# Patient Record
Sex: Female | Born: 1945
Health system: Southern US, Community
[De-identification: ages and names within clinical notes are randomized; demographics above are authoritative.]

## PROBLEM LIST (undated history)

## (undated) DIAGNOSIS — E039 Hypothyroidism, unspecified: Secondary | ICD-10-CM

## (undated) DIAGNOSIS — K439 Ventral hernia without obstruction or gangrene: Secondary | ICD-10-CM

## (undated) DIAGNOSIS — R0600 Dyspnea, unspecified: Secondary | ICD-10-CM

## (undated) DIAGNOSIS — M1990 Unspecified osteoarthritis, unspecified site: Secondary | ICD-10-CM

## (undated) DIAGNOSIS — D649 Anemia, unspecified: Secondary | ICD-10-CM

## (undated) DIAGNOSIS — I2699 Other pulmonary embolism without acute cor pulmonale: Secondary | ICD-10-CM

## (undated) DIAGNOSIS — M549 Dorsalgia, unspecified: Secondary | ICD-10-CM

## (undated) DIAGNOSIS — G8929 Other chronic pain: Secondary | ICD-10-CM

## (undated) DIAGNOSIS — R7303 Prediabetes: Secondary | ICD-10-CM

## (undated) DIAGNOSIS — J189 Pneumonia, unspecified organism: Secondary | ICD-10-CM

## (undated) DIAGNOSIS — I10 Essential (primary) hypertension: Secondary | ICD-10-CM

## (undated) HISTORY — PX: DILATION AND CURETTAGE OF UTERUS: SHX78

## (undated) HISTORY — DX: Essential (primary) hypertension: I10

## (undated) HISTORY — PX: OTHER SURGICAL HISTORY: SHX169

## (undated) HISTORY — PX: GASTRIC BYPASS: SHX52

---

## 1986-11-29 HISTORY — PX: SPLENECTOMY, TOTAL: SHX788

## 1986-11-29 HISTORY — PX: CHOLECYSTECTOMY: SHX55

## 2011-11-30 HISTORY — PX: ABDOMINAL HYSTERECTOMY: SHX81

## 2013-01-29 ENCOUNTER — Other Ambulatory Visit: Payer: Self-pay | Admitting: Physical Medicine and Rehabilitation

## 2013-01-29 ENCOUNTER — Ambulatory Visit
Admission: RE | Admit: 2013-01-29 | Discharge: 2013-01-29 | Disposition: A | Discharge status: Home / Self Care | Payer: Worker's Compensation | Source: Ambulatory Visit | Attending: Physical Medicine and Rehabilitation | Admitting: Physical Medicine and Rehabilitation

## 2013-01-29 DIAGNOSIS — IMO0002 Reserved for concepts with insufficient information to code with codable children: Secondary | ICD-10-CM

## 2013-01-29 DIAGNOSIS — R269 Unspecified abnormalities of gait and mobility: Secondary | ICD-10-CM

## 2013-01-29 DIAGNOSIS — G894 Chronic pain syndrome: Secondary | ICD-10-CM

## 2013-08-01 ENCOUNTER — Observation Stay (HOSPITAL_COMMUNITY)
Admission: EM | Admit: 2013-08-01 | Discharge: 2013-08-04 | Disposition: A | Discharge status: Home / Self Care | Payer: Medicare Other | Attending: Internal Medicine | Admitting: Internal Medicine

## 2013-08-01 ENCOUNTER — Encounter (HOSPITAL_COMMUNITY): Payer: Self-pay | Admitting: Emergency Medicine

## 2013-08-01 ENCOUNTER — Emergency Department (HOSPITAL_COMMUNITY): Payer: Medicare Other

## 2013-08-01 DIAGNOSIS — I5041 Acute combined systolic (congestive) and diastolic (congestive) heart failure: Secondary | ICD-10-CM | POA: Diagnosis present

## 2013-08-01 DIAGNOSIS — I509 Heart failure, unspecified: Secondary | ICD-10-CM | POA: Insufficient documentation

## 2013-08-01 DIAGNOSIS — R079 Chest pain, unspecified: Secondary | ICD-10-CM

## 2013-08-01 DIAGNOSIS — IMO0001 Reserved for inherently not codable concepts without codable children: Secondary | ICD-10-CM | POA: Diagnosis present

## 2013-08-01 DIAGNOSIS — R609 Edema, unspecified: Secondary | ICD-10-CM | POA: Insufficient documentation

## 2013-08-01 DIAGNOSIS — M549 Dorsalgia, unspecified: Secondary | ICD-10-CM | POA: Insufficient documentation

## 2013-08-01 DIAGNOSIS — R0789 Other chest pain: Principal | ICD-10-CM | POA: Insufficient documentation

## 2013-08-01 DIAGNOSIS — R9431 Abnormal electrocardiogram [ECG] [EKG]: Secondary | ICD-10-CM

## 2013-08-01 DIAGNOSIS — G8929 Other chronic pain: Secondary | ICD-10-CM

## 2013-08-01 DIAGNOSIS — Z79899 Other long term (current) drug therapy: Secondary | ICD-10-CM | POA: Insufficient documentation

## 2013-08-01 DIAGNOSIS — R12 Heartburn: Secondary | ICD-10-CM | POA: Insufficient documentation

## 2013-08-01 HISTORY — DX: Dorsalgia, unspecified: M54.9

## 2013-08-01 HISTORY — DX: Other chronic pain: G89.29

## 2013-08-01 LAB — CBC WITH DIFFERENTIAL/PLATELET
Eosinophils Relative: 5 % (ref 0–5)
HCT: 37.2 % (ref 36.0–46.0)
Hemoglobin: 12.1 g/dL (ref 12.0–15.0)
Lymphocytes Relative: 18 % (ref 12–46)
Lymphs Abs: 1.9 10*3/uL (ref 0.7–4.0)
MCV: 82.9 fL (ref 78.0–100.0)
Monocytes Absolute: 1.2 10*3/uL — ABNORMAL HIGH (ref 0.1–1.0)
Platelets: 461 10*3/uL — ABNORMAL HIGH (ref 150–400)
RBC: 4.49 MIL/uL (ref 3.87–5.11)
WBC: 10.6 10*3/uL — ABNORMAL HIGH (ref 4.0–10.5)

## 2013-08-01 LAB — TROPONIN I: Troponin I: <0.3 ng/mL (ref <0.30)

## 2013-08-01 LAB — COMPREHENSIVE METABOLIC PANEL
ALT: 20 U/L (ref 0–35)
CO2: 25 mEq/L (ref 19–32)
Calcium: 9.1 mg/dL (ref 8.4–10.5)
GFR calc Af Amer: >90 mL/min (ref >90)
GFR calc non Af Amer: >90 mL/min (ref >90)
Glucose, Bld: 138 mg/dL — ABNORMAL HIGH (ref 70–99)
Sodium: 136 mEq/L (ref 135–145)

## 2013-08-01 LAB — D-DIMER, QUANTITATIVE: D-Dimer, Quant: 1.34 ug/mL-FEU — ABNORMAL HIGH (ref 0.00–0.48)

## 2013-08-01 LAB — HEMOGLOBIN A1C: Hgb A1c MFr Bld: 6.5 % — ABNORMAL HIGH (ref <5.7)

## 2013-08-01 LAB — POCT I-STAT TROPONIN I: Troponin i, poc: 0 ng/mL (ref 0.00–0.08)

## 2013-08-01 MED ORDER — ONDANSETRON HCL 4 MG PO TABS: 4.0000 mg | ORAL_TABLET | Freq: Four times a day (QID) | ORAL | Status: DC | PRN

## 2013-08-01 MED ORDER — PANTOPRAZOLE SODIUM 40 MG PO TBEC
40.0000 mg | DELAYED_RELEASE_TABLET | Freq: Every day | ORAL | Status: DC
  Administered 2013-08-01 – 2013-08-04 (×4): 40 mg via ORAL
  Filled 2013-08-01 (×4): qty 1

## 2013-08-01 MED ORDER — CYCLOBENZAPRINE HCL 10 MG PO TABS
10.0000 mg | ORAL_TABLET | Freq: Three times a day (TID) | ORAL | Status: DC | PRN
  Administered 2013-08-02 (×2): 10 mg via ORAL
  Filled 2013-08-01 (×2): qty 1

## 2013-08-01 MED ORDER — FUROSEMIDE 10 MG/ML IJ SOLN
20.0000 mg | Freq: Every day | INTRAMUSCULAR | Status: DC
  Administered 2013-08-01: 20 mg via INTRAVENOUS
  Filled 2013-08-01 (×4): qty 2

## 2013-08-01 MED ORDER — NORTRIPTYLINE HCL 25 MG PO CAPS
25.0000 mg | ORAL_CAPSULE | Freq: Every day | ORAL | Status: DC
  Filled 2013-08-01 (×4): qty 3

## 2013-08-01 MED ORDER — HYDROMORPHONE HCL PF 1 MG/ML IJ SOLN
1.0000 mg | Freq: Once | INTRAMUSCULAR | Status: AC
  Administered 2013-08-01: 1 mg via INTRAVENOUS
  Filled 2013-08-01: qty 1

## 2013-08-01 MED ORDER — GI COCKTAIL ~~LOC~~
30.0000 mL | Freq: Once | ORAL | Status: AC
  Administered 2013-08-01: 30 mL via ORAL
  Filled 2013-08-01: qty 30

## 2013-08-01 MED ORDER — SODIUM CHLORIDE 0.9 % IJ SOLN
3.0000 mL | Freq: Two times a day (BID) | INTRAMUSCULAR | Status: DC
  Administered 2013-08-01 – 2013-08-03 (×4): 3 mL via INTRAVENOUS
  Administered 2013-08-04: 09:00:00 via INTRAVENOUS

## 2013-08-01 MED ORDER — ONDANSETRON HCL 4 MG/2ML IJ SOLN
4.0000 mg | Freq: Four times a day (QID) | INTRAMUSCULAR | Status: DC | PRN
  Administered 2013-08-01: 4 mg via INTRAVENOUS
  Filled 2013-08-01 (×2): qty 2

## 2013-08-01 MED ORDER — IOHEXOL 350 MG/ML SOLN
100.0000 mL | Freq: Once | INTRAVENOUS | Status: AC | PRN
  Administered 2013-08-01: 100 mL via INTRAVENOUS

## 2013-08-01 MED ORDER — ENOXAPARIN SODIUM 60 MG/0.6ML ~~LOC~~ SOLN
60.0000 mg | SUBCUTANEOUS | Status: DC
  Administered 2013-08-01 – 2013-08-03 (×3): 60 mg via SUBCUTANEOUS
  Filled 2013-08-01 (×4): qty 0.6

## 2013-08-01 MED ORDER — DIPHENHYDRAMINE HCL 50 MG/ML IJ SOLN
25.0000 mg | Freq: Once | INTRAMUSCULAR | Status: AC
  Administered 2013-08-01: 25 mg via INTRAVENOUS
  Filled 2013-08-01: qty 1

## 2013-08-01 MED ORDER — SODIUM CHLORIDE 0.9 % IV BOLUS (SEPSIS): 500.0000 mL | Freq: Once | INTRAVENOUS | Status: DC

## 2013-08-01 MED ORDER — ASPIRIN 81 MG PO CHEW
324.0000 mg | CHEWABLE_TABLET | Freq: Once | ORAL | Status: AC
  Administered 2013-08-01: 324 mg via ORAL
  Filled 2013-08-01: qty 4

## 2013-08-01 MED ORDER — ACETAMINOPHEN 325 MG PO TABS
650.0000 mg | ORAL_TABLET | Freq: Once | ORAL | Status: AC
  Administered 2013-08-01: 650 mg via ORAL
  Filled 2013-08-01: qty 2

## 2013-08-01 MED ORDER — HYDROMORPHONE HCL PF 1 MG/ML IJ SOLN: 0.5000 mg | INTRAMUSCULAR | Status: AC | PRN

## 2013-08-01 MED ORDER — DIPHENHYDRAMINE HCL 25 MG PO CAPS
25.0000 mg | ORAL_CAPSULE | ORAL | Status: DC | PRN
Start: 2013-08-01 — End: 2013-08-04
  Administered 2013-08-02: 25 mg via ORAL
  Filled 2013-08-01: qty 1

## 2013-08-01 MED ORDER — PNEUMOCOCCAL VAC POLYVALENT 25 MCG/0.5ML IJ INJ
0.5000 mL | INJECTION | INTRAMUSCULAR | Status: AC
  Administered 2013-08-02: 0.5 mL via INTRAMUSCULAR
  Filled 2013-08-01 (×2): qty 0.5

## 2013-08-01 NOTE — ED Notes (Addendum)
Pt c/o central chest pain x 2 weeks but got worse since Sun. Pt c/o SOB, lightheadedness.

## 2013-08-01 NOTE — ED Notes (Signed)
Admissions RN at bedside speaking with pt.

## 2013-08-01 NOTE — H&P (Signed)
[facility] History and Physical  Kathleen Miller ZOX:[id number] DOB: 06/15/1946 DOA: 08/01/2013  Referring physician: Dr Jodi Mourning PCP:   Chief Complaint: chest pressures since Monday.   HPI: Kathleen Miller is a 67 y.o. female with prior h/o of chornic back pain, came in for chest pressure started on Monday , non radiating , not associated with activity, no syncope, no pnd or orthopnea. Pedal edema since 4 weeks. No cough or fever. She was recently startedon indomethacin for back pain. Has heartburn. On arrival to ED today, she was chest pain free,. Her d dimer was elevated and she underwent ct angio, was neg for PE. SHE is referred to medical service for evaluation of atypical chest pain.    Review of Systems: The patient denies anorexia, fever, weight loss,, vision loss, decreased hearing, hoarseness, syncope, dyspnea on exertion, peripheral edema, , hemoptysis, abdominal pain, melena, hematochezia,  hematuria, incontinence, genital sores, muscle weakness, suspicious skin lesions, transient blindness,  depression, unusual weight change, abnormal bleeding, enlarged lymph nodes, angioedema, and breast masses.    Past Medical History  Diagnosis Date  . Chronic back pain   . Depression    Past Surgical History  Procedure Laterality Date  . Abdominal hysterectomy  11/2011  . Cholecystectomy  1988  . Splenectomy, total  1988  . Gastric bypass     Social History:  reports that she has never smoked. She has never used smokeless tobacco. She reports that she does not drink alcohol or use illicit drugs. where does patient live--home, No Known Allergies  History reviewed. No pertinent family history.  None known as per the patient.   Prior to Admission medications   Medication Sig Start Date End Date Taking? Authorizing Provider  aspirin EC 81 MG tablet Take 81 mg by mouth daily.   Yes Historical Provider, MD  cyclobenzaprine (FLEXERIL) 10 MG tablet Take 10 mg by mouth 3 (three) times  daily as needed for muscle spasms.   Yes Historical Provider, MD  indomethacin (INDOCIN) 50 MG capsule Take 50 mg by mouth 3 (three) times daily with meals.   Yes Historical Provider, MD  nortriptyline (PAMELOR) 25 MG capsule Take 25-75 mg by mouth at bedtime.   Yes Historical Provider, MD   Physical Exam: Filed Vitals:   08/01/13 1524  BP: 135/63  Pulse: 75  Temp:   Resp: 18    Constitutional: Vital signs reviewed.  Patient is a well-developed and well-nourished  in no acute distress and cooperative with exam. Alert and oriented x3.  Head: Normocephalic and atraumatic Mouth: no erythema or exudates, MMM Eyes: PERRL, EOMI, conjunctivae normal, No scleral icterus.  Neck: Supple, Trachea midline normal ROM, No JVD, mass, thyromegaly, or carotid bruit present.  Cardiovascular: RRR, S1 normal, S2 normal, no MRG, pulses symmetric and intact bilaterally Pulmonary/Chest: normal respiratory effort, CTAB, no wheezes, rales, or rhonchi Abdominal: Soft. Non-tender, non-distended, bowel sounds are normal, no masses, organomegaly, or guarding present.  Musculoskeletal: leg edema with mild erythema and small blisters. No tenderness  Hematology: no cervical, inginal, or axillary adenopathy.  Neurological: A&O x3, Strength is normal and symmetric bilaterally, cranial nerve II-XII are grossly intact, no focal motor deficit, sensory intact to light touch bilaterally.  Psychiatric: Normal mood and affect. speech and behavior is normal. Judgment and thought content normal.    Labs on Admission:  Basic Metabolic Panel:  Recent Labs Lab 08/01/13 1106  NA 136  K 3.7  CL 100  CO2 25  GLUCOSE 138*  BUN 17  CREATININE 0.66  CALCIUM 9.1   Liver Function Tests:  Recent Labs Lab 08/01/13 1106  AST 19  ALT 20  ALKPHOS 115  BILITOT 0.3  PROT 7.5  ALBUMIN 3.5   No results found for this basename: LIPASE, AMYLASE,  in the last 168 hours No results found for this basename: AMMONIA,  in the last  168 hours CBC:  Recent Labs Lab 08/01/13 1106  WBC 10.6*  NEUTROABS 6.7  HGB 12.1  HCT 37.2  MCV 82.9  PLT 461*   Cardiac Enzymes:  Recent Labs Lab 08/01/13 1106  TROPONINI <0.30    BNP (last 3 results)  Recent Labs  08/01/13 1129  PROBNP 202.3*   CBG: No results found for this basename: GLUCAP,  in the last 168 hours  Radiological Exams on Admission: Dg Chest 2 View  08/01/2013   CLINICAL DATA:  67 year old female with chest pain and pressure.  EXAM: CHEST  2 VIEW  COMPARISON:  None.  FINDINGS: Low lung volumes. Normal cardiac size and mediastinal contours. Visualized tracheal air column is within normal limits. No pneumothorax. No pleural effusion or consolidation. Mild increased interstitial markings, favor pulmonary vascular congestion. No overt pulmonary edema. No acute osseous abnormality identified. Staple lines in the ventral upper abdomen.  IMPRESSION: Low lung volumes with pulmonary vascular congestion.   Electronically Signed   By: Augusto Gamble   On: 08/01/2013 11:55   Ct Angio Chest Pe W/cm &/or Wo Cm  08/01/2013   CLINICAL DATA:  67 year old female with central chest pain, shortness of breath, lightheadedness.  EXAM: CT ANGIOGRAPHY CHEST WITH CONTRAST  TECHNIQUE: Multidetector CT imaging of the chest was performed using the standard protocol during bolus administration of intravenous contrast. Multiplanar CT image reconstructions including MIPs were obtained to evaluate the vascular anatomy.  CONTRAST:  OMNIPAQUE IOHEXOL 350 MG/ML SOLN  COMPARISON:  Chest radiographs from earlier the same day.  FINDINGS: Adequate contrast bolus timing in the pulmonary arterial tree.  No focal filling defect identified in the pulmonary arterial tree to suggest the presence of acute pulmonary embolism.  Atelectatic changes to the trachea. Otherwise Major airways are patent. Dependent mild ground-glass opacity most compatible with atelectasis. Small 4 mm nodule along the confluence of  the right major and minor fissures. No pulmonary consolidation.  No acute osseous abnormality identified.  Negative thoracic inlet. No pericardial effusion. Small bilateral hilar lymph nodes right greater than left. No mediastinal lymphadenopathy.  Postoperative changes to the left upper quadrant partially visible. Evidence of splenectomy and subsequent splenosis. Postoperative changes to the stomach partially visible. Suggestion of diffuse decreased density in the liver.  Visualized aorta is patent and within normal limits.  Review of the MIP images confirms the above findings.  IMPRESSION: 1. No evidence of acute pulmonary embolus.  2. Pulmonary atelectasis. Mild reactive appearing hilar and mediastinal lymphadenopathy.  3. Postoperative changes in the left upper quadrant.   Electronically Signed   By: Augusto Gamble   On: 08/01/2013 13:21    EKG: sinus rhythm with t wave changes.   Assessment/Plan 1. Atypical chest pain: - probably from the heartburn from using indomethacin.  - admit to telemetry.  - will rule out ACS with cardiac enzymes , EKG, and echo.  - continue with aspirin 325mg  daily . And repeat EKG in am.  - gi cocktail and protonix added - stopped indomethacin.   2. Bilateral pedal edema: - slightly elevated pro bnp - IV lasix ordered.  - venous duplex to rule  out DVT.   3. Chronic back pain :  - resume home medications.   4. DVT prophylaxis.    Code Status: full code Family Communication:  Disposition Plan: pending  Time spent: 75 min  Theresa Dohrman [facility] Pager [phone]  If 7PM-7AM, please contact night-coverage [email or web address] Password [email or web address] 08/01/2013, 5:06 PM

## 2013-08-01 NOTE — ED Provider Notes (Signed)
CSN: [id number]     Arrival date & time 08/01/13  1051 History   First MD Initiated Contact with Patient 08/01/13 1103     Chief Complaint  Patient presents with  . Chest Pain   (Consider location/radiation/quality/duration/timing/severity/associated sxs/prior Treatment) HPI Comments: 67 yo female with obesity, chronic back pain, on pain meds presents with central chest pain and pressure for the past week with mild dyspnea.  Pt has had left flank pain for about a month since a fall but this pressure is new since the weekend, intermittent, at times worse with deep breath, exertion and movement.  No diaphoresis or nausea.  No known heart issues.  Lasts usually an hour.  Improves on its own.  No recent surgery, travel, dvt/ pe hx, estrogens or hemoptysis.    Patient is a 67 y.o. female presenting with chest pain. The history is provided by the patient.  Chest Pain Associated symptoms: back pain, headache (intermittent) and shortness of breath   Associated symptoms: no abdominal pain, no cough, no fever, no numbness and not vomiting     History reviewed. No pertinent past medical history. History reviewed. No pertinent past surgical history. No family history on file. History  Substance Use Topics  . Smoking status: Never Smoker   . Smokeless tobacco: Not on file  . Alcohol Use: No   OB History   Grav Para Term Preterm Abortions TAB SAB Ect Mult Living                 Review of Systems  Constitutional: Negative for fever and chills.  HENT: Negative for neck pain and neck stiffness.   Eyes: Negative for visual disturbance.  Respiratory: Positive for shortness of breath. Negative for cough.   Cardiovascular: Positive for chest pain.  Gastrointestinal: Negative for vomiting and abdominal pain.  Genitourinary: Positive for flank pain. Negative for dysuria.  Musculoskeletal: Positive for back pain.  Skin: Negative for rash.  Neurological: Positive for headaches (intermittent). Negative  for light-headedness and numbness.    Allergies  Review of patient's allergies indicates no known allergies.  Home Medications   Current Outpatient Rx  Name  Route  Sig  Dispense  Refill  . aspirin EC 81 MG tablet   Oral   Take 81 mg by mouth daily.         . cyclobenzaprine (FLEXERIL) 10 MG tablet   Oral   Take 10 mg by mouth 3 (three) times daily as needed for muscle spasms.         . indomethacin (INDOCIN) 50 MG capsule   Oral   Take 50 mg by mouth 3 (three) times daily with meals.         . nortriptyline (PAMELOR) 25 MG capsule   Oral   Take 25-75 mg by mouth at bedtime.          BP 140/71  Temp(Src) 98.2 F (36.8 C) (Oral)  Ht 5\' 2"  (1.575 m)  Wt 290 lb (131.543 kg)  BMI 53.03 kg/m2  SpO2 97% Physical Exam  Nursing note and vitals reviewed. Constitutional: She is oriented to person, place, and time. She appears well-developed and well-nourished. No distress.  HENT:  Head: Normocephalic and atraumatic.  Eyes: Conjunctivae are normal. Right eye exhibits no discharge. Left eye exhibits no discharge.  Neck: Normal range of motion. Neck supple. No tracheal deviation present.  Cardiovascular: Normal rate and regular rhythm.   Pulmonary/Chest: Effort normal. No respiratory distress.  Decreased air movement bilat combination of  obesity/ pain in back with breathing (chronic per pt)  Abdominal: Soft. She exhibits no distension. There is no tenderness. There is no guarding.  obeses  Musculoskeletal: She exhibits edema (bilateral 2+ with superficail blisters anterior, no bullae or induration) and tenderness (lower back and left lower ribs/ flank).  Neurological: She is alert and oriented to person, place, and time. No cranial nerve deficit.  Skin: Skin is warm. No rash noted.  Psychiatric: She has a normal mood and affect.    ED Course  Procedures (including critical care time) Labs Review Labs Reviewed  CBC WITH DIFFERENTIAL - Abnormal; Notable for the  following:    WBC 10.6 (*)    Platelets 461 (*)    Monocytes Absolute 1.2 (*)    Basophils Relative 2 (*)    Basophils Absolute 0.2 (*)    All other components within normal limits  COMPREHENSIVE METABOLIC PANEL - Abnormal; Notable for the following:    Glucose, Bld 138 (*)    All other components within normal limits  PRO B NATRIURETIC PEPTIDE - Abnormal; Notable for the following:    Pro B Natriuretic peptide (BNP) 202.3 (*)    All other components within normal limits  D-DIMER, QUANTITATIVE - Abnormal; Notable for the following:    D-Dimer, Quant 1.34 (*)    All other components within normal limits  TROPONIN I   Imaging Review No results found.  MDM  No diagnosis found.  CP differential CAD with age/ obesity vs PE vs MSK. D dimer as pt low risk.  Cardiac eval.   Recommended observation since pressure intermittent. ASA given.  EKG possible old infarct, no old. Reviewed.  Date: 08/01/2013  Rate: 99  Rhythm: normal sinus rhythm  QRS Axis: normal  Intervals: QT prolonged  ST/T Wave abnormalities: nonspecific ST changes  Conduction Disutrbances:none  Narrative Interpretation: q waves v1/2  Old EKG Reviewed: none available EKG and cxr reviewed.  Ddimer pos.  CT to rule out PE.  CXR mild congestion, possible CHF.  Pt not requiring O2. Plan for tele/ observation. Pt and Dr Blake Divine agrees with plan.  Enid Skeens, MD 08/01/13 1524

## 2013-08-01 NOTE — ED Notes (Signed)
Patient transported to CT 

## 2013-08-01 NOTE — ED Notes (Signed)
Pt.states she feels much better now. Pt denies pain or pressure to her chest. Pt states she has pain to her mid back. Pt resting quietly, watching TV. VSS.

## 2013-08-02 ENCOUNTER — Other Ambulatory Visit: Payer: Self-pay

## 2013-08-02 DIAGNOSIS — IMO0001 Reserved for inherently not codable concepts without codable children: Secondary | ICD-10-CM | POA: Diagnosis present

## 2013-08-02 DIAGNOSIS — R079 Chest pain, unspecified: Secondary | ICD-10-CM

## 2013-08-02 DIAGNOSIS — I519 Heart disease, unspecified: Secondary | ICD-10-CM

## 2013-08-02 DIAGNOSIS — I5041 Acute combined systolic (congestive) and diastolic (congestive) heart failure: Secondary | ICD-10-CM | POA: Diagnosis present

## 2013-08-02 LAB — GLUCOSE, CAPILLARY
Glucose-Capillary: 115 mg/dL — ABNORMAL HIGH (ref 70–99)
Glucose-Capillary: 159 mg/dL — ABNORMAL HIGH (ref 70–99)
Glucose-Capillary: 130 mg/dL — ABNORMAL HIGH (ref 70–99)

## 2013-08-02 LAB — TROPONIN I: Troponin I: <0.3 ng/mL (ref <0.30)

## 2013-08-02 LAB — LIPID PANEL: LDL Cholesterol: 126 mg/dL — ABNORMAL HIGH (ref 0–99)

## 2013-08-02 MED ORDER — LIVING WELL WITH DIABETES BOOK
Freq: Once | Status: AC
  Administered 2013-08-02: 13:00:00
  Filled 2013-08-02: qty 1

## 2013-08-02 MED ORDER — SIMVASTATIN 10 MG PO TABS
10.0000 mg | ORAL_TABLET | Freq: Every day | ORAL | Status: DC
  Administered 2013-08-02 – 2013-08-03 (×2): 10 mg via ORAL
  Filled 2013-08-02 (×3): qty 1

## 2013-08-02 MED ORDER — FUROSEMIDE 10 MG/ML IJ SOLN
40.0000 mg | Freq: Every day | INTRAMUSCULAR | Status: DC
  Filled 2013-08-02: qty 4

## 2013-08-02 MED ORDER — FUROSEMIDE 10 MG/ML IJ SOLN
40.0000 mg | Freq: Every day | INTRAMUSCULAR | Status: DC
  Administered 2013-08-02 – 2013-08-03 (×2): 40 mg via INTRAVENOUS
  Filled 2013-08-02 (×2): qty 4

## 2013-08-02 MED ORDER — ASPIRIN EC 81 MG PO TBEC
81.0000 mg | DELAYED_RELEASE_TABLET | Freq: Every day | ORAL | Status: DC
  Administered 2013-08-02 – 2013-08-04 (×3): 81 mg via ORAL
  Filled 2013-08-02 (×3): qty 1

## 2013-08-02 MED ORDER — TRAMADOL HCL 50 MG PO TABS
50.0000 mg | ORAL_TABLET | Freq: Four times a day (QID) | ORAL | Status: DC | PRN
  Administered 2013-08-02: 50 mg via ORAL
  Filled 2013-08-02: qty 1

## 2013-08-02 MED ORDER — METFORMIN HCL 500 MG PO TABS
500.0000 mg | ORAL_TABLET | Freq: Every day | ORAL | Status: DC
  Filled 2013-08-02 (×2): qty 1

## 2013-08-02 MED ORDER — CARVEDILOL 3.125 MG PO TABS
3.1250 mg | ORAL_TABLET | Freq: Two times a day (BID) | ORAL | Status: DC
  Administered 2013-08-02 – 2013-08-04 (×4): 3.125 mg via ORAL
  Filled 2013-08-02 (×6): qty 1

## 2013-08-02 MED ORDER — ACETAMINOPHEN 325 MG PO TABS
650.0000 mg | ORAL_TABLET | ORAL | Status: DC | PRN
  Administered 2013-08-02 – 2013-08-04 (×3): 650 mg via ORAL
  Filled 2013-08-02 (×3): qty 2

## 2013-08-02 MED ORDER — DOCUSATE SODIUM 100 MG PO CAPS
100.0000 mg | ORAL_CAPSULE | Freq: Two times a day (BID) | ORAL | Status: DC
  Administered 2013-08-02 – 2013-08-04 (×4): 100 mg via ORAL
  Filled 2013-08-02 (×5): qty 1

## 2013-08-02 MED ORDER — LISINOPRIL 2.5 MG PO TABS
2.5000 mg | ORAL_TABLET | Freq: Every day | ORAL | Status: DC
  Administered 2013-08-02 – 2013-08-04 (×3): 2.5 mg via ORAL
  Filled 2013-08-02 (×3): qty 1

## 2013-08-02 MED ORDER — INSULIN ASPART 100 UNIT/ML ~~LOC~~ SOLN
0.0000 [IU] | Freq: Three times a day (TID) | SUBCUTANEOUS | Status: DC
  Administered 2013-08-02: 2 [IU] via SUBCUTANEOUS
  Administered 2013-08-03: 1 [IU] via SUBCUTANEOUS

## 2013-08-02 NOTE — Progress Notes (Signed)
  RD consulted for nutrition education regarding diabetes.   Lab Results  Component Value Date   HGBA1C 6.5* 08/01/2013   RD provided "Carbohydrate Counting for People with Diabetes" handout from the Academy of [employer]etics. Discussed different food groups and their effects on blood sugar, emphasizing carbohydrate-containing foods. Provided list of carbohydrates and recommended serving sizes of common foods.  Discussed importance of controlled and consistent carbohydrate intake throughout the day. Provided examples of ways to balance meals/snacks and encouraged intake of high-fiber, whole grain complex carbohydrates. Pt also reports struggling with weight gain and fluid retention. Recommended pt decreased her intake of sodium and fat in her diet. Reviewed high fat and high sodium foods for pt to avoid. Advised pt to discuss physical activity with her PCP. Teach back method used.  Expect good compliance.  Body mass index is 55.43 kg/(m^2). Pt meets criteria for Morbid Obesity based on current BMI. Pt reports weighing 208 lbs a year ago and gaining weight after being injured. She reports her usual body weight now is 288 lbs.   Current diet order is Carb Modified, patient is consuming approximately 50% of meals at this time. Pt states her appetite is good she just didn't like the food at breakfast. Labs and medications reviewed. No further nutrition interventions warranted at this time. RD contact information provided. Encouraged pt to call with any questions. If additional nutrition issues arise, please re-consult RD.  Ian Malkin RD, LDN Inpatient Clinical Dietitian Pager: [phone] After Hours Pager: [phone]

## 2013-08-02 NOTE — Consult Note (Signed)
Reason for Consult: Atypical chest pain Referring Physician: [facility] hospitalist PCP: None Primary cardiologist none Kathleen Miller is an 67 y.o. female.  HPI: This 67 year old woman was admitted on 08/01/13 with atypical chest pain.  The pain was atypical and began in the left lateral chest and radiated under the left breast.She also felt that that area was more swollen.she does not have any prior history of known heart problems.She does have a past history of labile hypertension previously untreated.  Recently she has been taking indomethacin for back pain.  She has been experiencing increasing peripheral edema and weight gain and exertional dyspnea.  She has been experiencing heartburn and dyspepsia.  She had an elevated d-dimer and underwent CT angiogram which was negative for pulmonary embolus.  Her electrocardiograms have been nonacute.  Her chest x-ray shows cardiomegaly with a left ventricular configuration and mild to moderate pulmonary vascular congestion.  Troponins have been negative x3.  Her pro BNP is mildly elevated at 202.  Past Medical History  Diagnosis Date  . Chronic back pain   . Depression     Past Surgical History  Procedure Laterality Date  . Abdominal hysterectomy  11/2011  . Cholecystectomy  1988  . Splenectomy, total  1988  . Gastric bypass      History reviewed. No pertinent family history.  Social History:  reports that she has never smoked. She has never used smokeless tobacco. She reports that she does not drink alcohol or use illicit drugs.  Allergies: No Known Allergies  Medications:  Scheduled: . enoxaparin (LOVENOX) injection  60 mg Subcutaneous Q24H  . furosemide  40 mg Intravenous Daily  . insulin aspart  0-9 Units Subcutaneous TID WC  . lisinopril  2.5 mg Oral Daily  . living well with diabetes book   Does not apply Once  . nortriptyline  25-75 mg Oral QHS  . pantoprazole  40 mg Oral Daily  . simvastatin  10 mg Oral q1800  . sodium chloride  3  mL Intravenous Q12H    Results for orders placed during the hospital encounter of 08/01/13 (from the past 48 hour(s))  CBC WITH DIFFERENTIAL     Status: Abnormal   Collection Time    08/01/13 11:06 AM      Result Value Range   WBC 10.6 (*) 4.0 - 10.5 K/uL   RBC 4.49  3.87 - 5.11 MIL/uL   Hemoglobin 12.1  12.0 - 15.0 g/dL   HCT 96.0  45.4 - 09.8 %   MCV 82.9  78.0 - 100.0 fL   MCH 26.9  26.0 - 34.0 pg   MCHC 32.5  30.0 - 36.0 g/dL   RDW 11.9  14.7 - 82.9 %   Platelets 461 (*) 150 - 400 K/uL   Neutrophils Relative % 63  43 - 77 %   Neutro Abs 6.7  1.7 - 7.7 K/uL   Lymphocytes Relative 18  12 - 46 %   Lymphs Abs 1.9  0.7 - 4.0 K/uL   Monocytes Relative 12  3 - 12 %   Monocytes Absolute 1.2 (*) 0.1 - 1.0 K/uL   Eosinophils Relative 5  0 - 5 %   Eosinophils Absolute 0.5  0.0 - 0.7 K/uL   Basophils Relative 2 (*) 0 - 1 %   Basophils Absolute 0.2 (*) 0.0 - 0.1 K/uL  COMPREHENSIVE METABOLIC PANEL     Status: Abnormal   Collection Time    08/01/13 11:06 AM      Result  Value Range   Sodium 136  135 - 145 mEq/L   Potassium 3.7  3.5 - 5.1 mEq/L   Chloride 100  96 - 112 mEq/L   CO2 25  19 - 32 mEq/L   Glucose, Bld 138 (*) 70 - 99 mg/dL   BUN 17  6 - 23 mg/dL   Creatinine, Ser 9.14  0.50 - 1.10 mg/dL   Calcium 9.1  8.4 - 78.2 mg/dL   Total Protein 7.5  6.0 - 8.3 g/dL   Albumin 3.5  3.5 - 5.2 g/dL   AST 19  0 - 37 U/L   ALT 20  0 - 35 U/L   Alkaline Phosphatase 115  39 - 117 U/L   Total Bilirubin 0.3  0.3 - 1.2 mg/dL   GFR calc non Af Amer >90  >90 mL/min   GFR calc Af Amer >90  >90 mL/min   Comment: (NOTE)     The eGFR has been calculated using the CKD EPI equation.     This calculation has not been validated in all clinical situations.     eGFR's persistently <90 mL/min signify possible Chronic Kidney     Disease.  TROPONIN I     Status: None   Collection Time    08/01/13 11:06 AM      Result Value Range   Troponin I <0.30  <0.30 ng/mL   Comment:            Due to the  release kinetics of cTnI,     a negative result within the first hours     of the onset of symptoms does not rule out     myocardial infarction with certainty.     If myocardial infarction is still suspected,     repeat the test at appropriate intervals.  PRO B NATRIURETIC PEPTIDE     Status: Abnormal   Collection Time    08/01/13 11:29 AM      Result Value Range   Pro B Natriuretic peptide (BNP) 202.3 (*) 0 - 125 pg/mL  D-DIMER, QUANTITATIVE     Status: Abnormal   Collection Time    08/01/13 11:29 AM      Result Value Range   D-Dimer, Quant 1.34 (*) 0.00 - 0.48 ug/mL-FEU   Comment:            AT THE INHOUSE ESTABLISHED CUTOFF     VALUE OF 0.48 ug/mL FEU,     THIS ASSAY HAS BEEN DOCUMENTED     IN THE LITERATURE TO HAVE     A SENSITIVITY AND NEGATIVE     PREDICTIVE VALUE OF AT LEAST     98 TO 99%.  THE TEST RESULT     SHOULD BE CORRELATED WITH     AN ASSESSMENT OF THE CLINICAL     PROBABILITY OF DVT / VTE.  POCT I-STAT TROPONIN I     Status: None   Collection Time    08/01/13  3:31 PM      Result Value Range   Troponin i, poc 0.00  0.00 - 0.08 ng/mL   Comment 3            Comment: Due to the release kinetics of cTnI,     a negative result within the first hours     of the onset of symptoms does not rule out     myocardial infarction with certainty.     If myocardial infarction is still suspected,  repeat the test at appropriate intervals.  TROPONIN I     Status: None   Collection Time    08/01/13  6:12 PM      Result Value Range   Troponin I <0.30  <0.30 ng/mL   Comment:            Due to the release kinetics of cTnI,     a negative result within the first hours     of the onset of symptoms does not rule out     myocardial infarction with certainty.     If myocardial infarction is still suspected,     repeat the test at appropriate intervals.  HEMOGLOBIN A1C     Status: Abnormal   Collection Time    08/01/13  6:12 PM      Result Value Range   Hemoglobin A1C  6.5 (*) <5.7 %   Comment: (NOTE)                                                                               According to the ADA Clinical Practice Recommendations for 2011, when     HbA1c is used as a screening test:      >=6.5%   Diagnostic of Diabetes Mellitus               (if abnormal result is confirmed)     5.7-6.4%   Increased risk of developing Diabetes Mellitus     References:Diagnosis and Classification of Diabetes Mellitus,Diabetes     Care,2011,34(Suppl 1):S62-S69 and Standards of Medical Care in             Diabetes - 2011,Diabetes Care,2011,34 (Suppl 1):S11-S61.   Mean Plasma Glucose 140 (*) <117 mg/dL   Comment: Performed at [facility]  TROPONIN I     Status: None   Collection Time    08/01/13 11:14 PM      Result Value Range   Troponin I <0.30  <0.30 ng/mL   Comment:            Due to the release kinetics of cTnI,     a negative result within the first hours     of the onset of symptoms does not rule out     myocardial infarction with certainty.     If myocardial infarction is still suspected,     repeat the test at appropriate intervals.  TROPONIN I     Status: None   Collection Time    08/02/13  5:00 AM      Result Value Range   Troponin I <0.30  <0.30 ng/mL   Comment:            Due to the release kinetics of cTnI,     a negative result within the first hours     of the onset of symptoms does not rule out     myocardial infarction with certainty.     If myocardial infarction is still suspected,     repeat the test at appropriate intervals.  LIPID PANEL     Status: Abnormal   Collection Time    08/02/13  5:00 AM      Result Value  Range   Cholesterol 185  0 - 200 mg/dL   Triglycerides 409  <811 mg/dL   HDL 37 (*) >91 mg/dL   Total CHOL/HDL Ratio 5.0     VLDL 22  0 - 40 mg/dL   LDL Cholesterol 478 (*) 0 - 99 mg/dL   Comment:            Total Cholesterol/HDL:CHD Risk     Coronary Heart Disease Risk Table                         Men   Women       1/2 Average Risk   3.4   3.3      Average Risk       5.0   4.4      2 X Average Risk   9.6   7.1      3 X Average Risk  23.4   11.0                Use the calculated Patient Ratio     above and the CHD Risk Table     to determine the patient's CHD Risk.                ATP III CLASSIFICATION (LDL):      <100     mg/dL   Optimal      295-621  mg/dL   Near or Above                        Optimal      130-159  mg/dL   Borderline      308-657  mg/dL   High      >846     mg/dL   Very High     Performed at [facility]  GLUCOSE, CAPILLARY     Status: Abnormal   Collection Time    08/02/13 12:13 PM      Result Value Range   Glucose-Capillary 115 (*) 70 - 99 mg/dL    Dg Chest 2 View  08/05/2951   CLINICAL DATA:  67 year old female with chest pain and pressure.  EXAM: CHEST  2 VIEW  COMPARISON:  None.  FINDINGS: Low lung volumes. Normal cardiac size and mediastinal contours. Visualized tracheal air column is within normal limits. No pneumothorax. No pleural effusion or consolidation. Mild increased interstitial markings, favor pulmonary vascular congestion. No overt pulmonary edema. No acute osseous abnormality identified. Staple lines in the ventral upper abdomen.  IMPRESSION: Low lung volumes with pulmonary vascular congestion.   Electronically Signed   By: Augusto Gamble   On: 08/01/2013 11:55   Ct Angio Chest Pe W/cm &/or Wo Cm  08/01/2013   CLINICAL DATA:  67 year old female with central chest pain, shortness of breath, lightheadedness.  EXAM: CT ANGIOGRAPHY CHEST WITH CONTRAST  TECHNIQUE: Multidetector CT imaging of the chest was performed using the standard protocol during bolus administration of intravenous contrast. Multiplanar CT image reconstructions including MIPs were obtained to evaluate the vascular anatomy.  CONTRAST:  OMNIPAQUE IOHEXOL 350 MG/ML SOLN  COMPARISON:  Chest radiographs from earlier the same day.  FINDINGS: Adequate contrast bolus timing in the pulmonary  arterial tree.  No focal filling defect identified in the pulmonary arterial tree to suggest the presence of acute pulmonary embolism.  Atelectatic changes to the trachea. Otherwise Major airways are patent. Dependent mild ground-glass opacity most compatible with atelectasis. Small  4 mm nodule along the confluence of the right major and minor fissures. No pulmonary consolidation.  No acute osseous abnormality identified.  Negative thoracic inlet. No pericardial effusion. Small bilateral hilar lymph nodes right greater than left. No mediastinal lymphadenopathy.  Postoperative changes to the left upper quadrant partially visible. Evidence of splenectomy and subsequent splenosis. Postoperative changes to the stomach partially visible. Suggestion of diffuse decreased density in the liver.  Visualized aorta is patent and within normal limits.  Review of the MIP images confirms the above findings.  IMPRESSION: 1. No evidence of acute pulmonary embolus.  2. Pulmonary atelectasis. Mild reactive appearing hilar and mediastinal lymphadenopathy.  3. Postoperative changes in the left upper quadrant.   Electronically Signed   By: Augusto Gamble   On: 08/01/2013 13:21    Review of systems is positive for back pain for which she is seeing a pain medicine specialist.  The patient also admits to polydipsia and increased thirst.  All other systems negative except as noted in present illness Blood pressure 128/63, pulse 85, temperature 98.1 F (36.7 C), temperature source Oral, resp. rate 18, height 5\' 2"  (1.575 m), weight 303 lb 2.1 oz (137.5 kg), SpO2 94.00%. The patient appears to be in no distress.  She is significantly obese and has difficulty moving from the bed to the chair.  She has difficulty lying flat because of dyspnea.  Head and neck exam reveals that the pupils are equal and reactive.  The extraocular movements are full.  There is no scleral icterus.  Mouth and pharynx are benign.  No lymphadenopathy.  No carotid  bruits.  The jugular venous pressure is normal.  Thyroid is not enlarged or tender.  Chest is clear to percussion and auscultation.  No rales or rhonchi.  Expansion of the chest is symmetrical.  Heart reveals no abnormal lift or heave.  First and second heart sounds are normal.  There is no murmur gallop rub or click.  The abdomen is obese, soft and nontender.  Bowel sounds are normoactive.  There is no hepatosplenomegaly or mass.  There are no abdominal bruits.  Extremities reveal 3+ pitting edema of legs bilaterally.  Neurologic exam is grossly normal and symmetrical Integument reveals no rash  Two-dimensional echocardiogram on 08/02/13: Study Conclusions  - Left ventricle: The cavity size was normal. Wall thickness was normal. Systolic function was mildly reduced. The estimated ejection fraction was in the range of 45% to 50%. Diffuse hypokinesis. Doppler parameters are consistent with abnormal left ventricular relaxation (grade 1 diastolic dysfunction). - Aortic valve: There was no stenosis. - Mitral valve: Mildly calcified annulus. No significant regurgitation. - Left atrium: The atrium was mildly dilated. - Right ventricle: The cavity size was normal. Systolic function was normal. - Pulmonary arteries: PA peak pressure: 36mm Hg (S). - Systemic veins: IVC not visualized. Impressions:  - Technically difficult study with poor acoustic windows. Normal LV size with mild global hypokinesis, EF 45-50%. Normal RV size and systolic function. No significant valvular abnormalities.  Assessment/Plan: 1.  Atypical chest pain with a low likelihood for this being ischemic heart pain.  EKG and cardiac enzymes normal. 2. mixed systolic and diastolic acute on chronic congestive heart failure probably secondary to long-standing hypertension with recent exacerbation by salt retaining properties of indomethacin. 3. dyspepsia probably secondary to indomethacin 4. diabetes mellitus 5.  hypercholesterolemia  Recommendation: No further inpatient cardiac workup necessary at this time.  She would be a poor candidate for Lexa scan Myoview because of her  body habitus. Would continue treatment as you are doing with ACE inhibitor and Lasix and avoidance of nonsteroidal anti-inflammatory agents.  Will also add low-dose carvedilol because systolic LV dysfunction.  We'll add empiric aspirin 81 mg daily.  Long-term it will be very important for her to lose significant weight through careful dieting. Cassell Clement 08/02/2013, 4:09 PM

## 2013-08-02 NOTE — Progress Notes (Signed)
9/4  Spoke with patient about her new diagnosis of diabetes.  HgbA1C on 9-3 was 6.5%.  This is the level for diagnosis of diabetes by the American Diabetes Association.  Patient states that she has Medicare A & B insurance.  Staff RNs to have patient watch DM videos [phone], give patient DM [place] notes and to review DM survival skills. The Living Well with Diabetes booklet ordered from pharmacy.   Outpatient DM education has been ordered by physician.  [facility] will be calling patient to set up an appointment.  Case management to see patient about a PCP for discharge.  Will need a home blood glucose meter prescription at discharge.  Glucose meter prescription form placed on shadow chart for physician to sign.  Dietician to see patient about meal planning.  Will continue to follow while in hospital.   Smith Mince RN BSN CDe

## 2013-08-02 NOTE — Evaluation (Signed)
Physical Therapy Evaluation Patient Details Name: Kathleen Miller MRN: [id number] DOB: 09/28/1946 Today's Date: 08/02/2013 Time: 4098-1191 PT Time Calculation (min): 19 min  PT Assessment / Plan / Recommendation History of Present Illness  67 y.o. female with prior h/o of chornic back pain, came in for chest pressure started on Monday , non radiating , not associated with activity, no syncope, no pnd or orthopnea. Pedal edema since 4 weeks. No cough or fever. She was recently startedon indomethacin for back pain. Has heartburn. On arrival to ED today, she was chest pain free,. Her d dimer was elevated and she underwent ct angio, was neg for PE. SHE is referred to medical service for evaluation of atypical chest pain.   Clinical Impression  Pt admitted with above. Pt currently with functional limitations due to the deficits listed below (see PT Problem List).  Pt will benefit from skilled PT to increase their independence and safety with mobility to allow discharge to the venue listed below.  Pt reports chronic back pain from previous work injury and is currently seeing pain management MD.  Pt states she was doing aquatic therapy however this was discontinued a few months ago and she has seen decline in strength and functioning since as well as increased back pain.  Recommend HHPT at this time.      PT Assessment  Patient needs continued PT services    Follow Up Recommendations  Home health PT    Does the patient have the potential to tolerate intense rehabilitation      Barriers to Discharge        Equipment Recommendations  None recommended by PT    Recommendations for Other Services     Frequency Min 3X/week    Precautions / Restrictions Precautions Precautions: Fall   Pertinent Vitals/Pain Chronic back pain, positioned to comfort, pt reports she is aware of medication schedule      Mobility  Bed Mobility Bed Mobility: Not assessed Details for Bed Mobility Assistance: pt up  in recliner on arrival Transfers Transfers: Sit to Stand;Stand to Sit Sit to Stand: 5: Supervision;From toilet;From chair/3-in-1;With upper extremity assist Stand to Sit: 5: Supervision;To toilet;To chair/3-in-1;With upper extremity assist Ambulation/Gait Ambulation/Gait Assistance: 4: Min guard Ambulation Distance (Feet): 160 Feet Assistive device: Rolling walker Ambulation/Gait Assistance Details: pt reports sometimes she experience extreme pain with ambulation which will bring her to her knees so min/guard for safety however no buckling or extremem pain at this time Gait Pattern: Step-through pattern;Trunk flexed;Decreased stride length Gait velocity: decreased General Gait Details: occasional short standing rest breaks required due to fatigue, HR 90-105 during ambulation    Exercises     PT Diagnosis: Generalized weakness;Difficulty walking  PT Problem List: Decreased strength;Decreased activity tolerance;Decreased mobility;Pain PT Treatment Interventions: DME instruction;Gait training;Functional mobility training;Therapeutic activities;Therapeutic exercise;Patient/family education     PT Goals(Current goals can be found in the care plan section) Acute Rehab PT Goals Patient Stated Goal: ultimately return to aquatic therapy PT Goal Formulation: With patient Time For Goal Achievement: 08/09/13 Potential to Achieve Goals: Good  Visit Information  Last PT Received On: 08/02/13 Assistance Needed: +1 History of Present Illness: 67 y.o. female with prior h/o of chornic back pain, came in for chest pressure started on Monday , non radiating , not associated with activity, no syncope, no pnd or orthopnea. Pedal edema since 4 weeks. No cough or fever. She was recently startedon indomethacin for back pain. Has heartburn. On arrival to ED today, she was chest pain free,.  Her d dimer was elevated and she underwent ct angio, was neg for PE. SHE is referred to medical service for evaluation of  atypical chest pain.        Prior Functioning  Home Living Family/patient expects to be discharged to:: Private residence Living Arrangements: Alone Type of Home: Apartment Home Access: Level entry Home Layout: One level Home Equipment: Environmental consultant - 2 wheels Prior Function Level of Independence: Independent with assistive device(s) Comments: pt states since back injury is unable to tolerate stairs due to extreme pain Communication Communication: No difficulties    Cognition  Cognition Arousal/Alertness: Awake/alert Behavior During Therapy: WFL for tasks assessed/performed Overall Cognitive Status: Within Functional Limits for tasks assessed    Extremity/Trunk Assessment Lower Extremity Assessment Lower Extremity Assessment: Generalized weakness   Balance    End of Session PT - End of Session Activity Tolerance: Patient tolerated treatment well Patient left: in chair;with call bell/phone within reach  GP Functional Assessment Tool Used: clinical judgement Functional Limitation: Mobility: Walking and moving around Mobility: Walking and Moving Around Current Status ([id number]): At least 1 percent but less than 20 percent impaired, limited or restricted Mobility: Walking and Moving Around Goal Status [phone]): 0 percent impaired, limited or restricted   Anshika Pethtel,KATHrine E 08/02/2013, 3:46 PM Zenovia Jarred, PT, DPT 08/02/2013 Pager: [phone]

## 2013-08-02 NOTE — Progress Notes (Signed)
Echo Lab  2D Echocardiogram completed.  Kathleen Miller [name], RDCS 08/02/2013 8:53 AM

## 2013-08-02 NOTE — Progress Notes (Signed)
VASCULAR LAB PRELIMINARY  PRELIMINARY  PRELIMINARY  PRELIMINARY  Bilateral lower extremity venous duplex  completed.    Preliminary report:  Bilateral:  No evidence of DVT, superficial thrombosis, or Baker's Cyst.    Maelle Sheaffer, RVT 08/02/2013, 9:28 AM

## 2013-08-02 NOTE — Evaluation (Addendum)
Clinical/Bedside Swallow Evaluation Patient Details  Name: Madalee Altmann MRN: [id number] Date of Birth: 1946-09-22  Today's Date: 08/02/2013 Time: 8469-6295 SLP Time Calculation (min): 50 min  Past Medical History:  Past Medical History  Diagnosis Date  . Chronic back pain   . Depression    Past Surgical History:  Past Surgical History  Procedure Laterality Date  . Abdominal hysterectomy  11/2011  . Cholecystectomy  1988  . Splenectomy, total  1988  . Gastric bypass     HPI:  67 yo female adm to [facility] with chest pain - MD ?s if reflux symptoms from indomethacin.  Pt reports occasional choking on saliva even and feeling of air/fluid coming back up most recently.  Pt PMH + for depression, chronic back pain.  Pt 9/3 CXR indicated pulmonary ATX - no PE.  MD gave pt a GI cocktail and dc'd indomethacin.  Pt has h/o gastric bypass in 1988.     Assessment / Plan / Recommendation Clinical Impression  Pt seen for clinical swallow evaluation, session was interupted FOUR times therefore examination was spread across one hour of time.  CN exam unremarkable.  Pt's report of sensation of choking and air/fluid coming back up appears consistent with esophageal/GI symptoms consistent with indomethacin intake.  Pt also complains of xerostomia in the same timeframe.  Provided pt with reflux compensations/precautions and xerostomia tips.   Also advised pt to try "panting" when sense choking episode to break up possible spasm.  Pt complained of frequent leg, arm, back spasm, ? If she may experience some laryngeal and esophageal spasms as well.  Also advised pt to use caution with cold items as may induce spasm.  Suspect quick resolution of dysphagia/gerd symptoms with treatment and discontinuation of indomethacin medication.    SLP to sign off, please reorder if desire.      Aspiration Risk  Mild    Diet Recommendation Regular;Thin liquid   Medication Administration:  (as tolerated) Supervision: Patient  able to self feed Postural Changes and/or Swallow Maneuvers: Seated upright 90 degrees;Upright 30-60 min after meal             Pertinent Vitals/Pain Afebrile, decreased    SLP Swallow Goals  n/a   Swallow Study Prior Functional Status  Type of Home: Apartment    General Date of Onset: 08/02/13 HPI: 67 yo female adm to [facility] with chest pain - MD ?s if reflux symptoms from indomethacin.  Pt reports occasional choking on saliva even and feeling of air/fluid coming back up most recently.  Pt PMH + for depression, chronic back pain.  Pt 9/3 CXR indicated pulmonary ATX - no PE.  MD gave pt a GI cocktail and dc'd indomethacin.  Pt has h/o gastric bypass in 1988.   Type of Study: Bedside swallow evaluation Previous Swallow Assessment: none Diet Prior to this Study: Regular;Thin liquids (carbohydrate modified) Temperature Spikes Noted: No Respiratory Status: Room air Behavior/Cognition: Alert;Cooperative;Pleasant mood Oral Cavity - Dentition: Adequate natural dentition Self-Feeding Abilities: Able to feed self Baseline Vocal Quality: Clear (pt reports voice is gravely) Volitional Cough: Strong Volitional Swallow: Able to elicit    Oral/Motor/Sensory Function Overall Oral Motor/Sensory Function: Appears within functional limits for tasks assessed   Ice Chips Ice chips: Not tested   Thin Liquid Thin Liquid: Within functional limits Presentation: Self Fed    Nectar Thick Nectar Thick Liquid: Not tested   Honey Thick Honey Thick Liquid: Not tested   Puree Puree: Within functional limits Presentation: Self Fed;Spoon  Solid   GO Functional Assessment Tool Used: clinical impression Functional Limitations: Swallowing Swallow Current Status ([id number]): At least 1 percent but less than 20 percent impaired, limited or restricted Swallow Goal Status [phone]): At least 1 percent but less than 20 percent impaired, limited or restricted Swallow Discharge Status [phone]): At least 1 percent but  less than 20 percent impaired, limited or restricted  Solid: Within functional limits Presentation: Self Lisabeth Pick, MS [facility] SLP [phone]

## 2013-08-02 NOTE — Progress Notes (Signed)
[facility] PROGRESS NOTE  Lalita Ebel ZOX:[id number] DOB: May 04, 1946 DOA: 08/01/2013 PCP: No primary provider on file.  Assessment/Plan: 1. Atypical chest pain: resolved.  cardian enzymes negative. EKG no acute st t wave changes.   2. Newly diagnosed combined systolic and diastolic heart failure: - resume lasix, added lisinopril and coreg.  - cardiology recommendations appreciated.  - fluid restriction - I/O last 3 completed shifts: In: 720 [P.O.:720] Out: 1150 [Urine:1150] Total I/O In: 600 [P.O.:600] Out: -      3. Chronic back pain: home health PT and Pain control.   DVT prophylaxis.    Consultants:  cardiology Procedures:  Echo  Venous duplex  Antibiotics:  none  HPI/Subjective: Chest pain free  Objective: Filed Vitals:   08/02/13 0551  BP: 134/72  Pulse: 79  Temp: 97.9 F (36.6 C)  Resp: 18    Intake/Output Summary (Last 24 hours) at 08/02/13 1237 Last data filed at 08/02/13 0600  Gross per 24 hour  Intake    720 ml  Output   1150 ml  Net   -430 ml   Filed Weights   08/01/13 1104 08/01/13 1700 08/02/13 0551  Weight: 131.543 kg (290 lb) 138.8 kg (306 lb) 137.5 kg (303 lb 2.1 oz)    Exam:   General:  Alert afebrile comfortable  Cardiovascular: s1s2  Respiratory: CTab  Abdomen: SOFT nt nd bs+  Musculoskeletal: 2+ pedal edema  Data Reviewed: Basic Metabolic Panel:  Recent Labs Lab 08/01/13 1106  NA 136  K 3.7  CL 100  CO2 25  GLUCOSE 138*  BUN 17  CREATININE 0.66  CALCIUM 9.1   Liver Function Tests:  Recent Labs Lab 08/01/13 1106  AST 19  ALT 20  ALKPHOS 115  BILITOT 0.3  PROT 7.5  ALBUMIN 3.5   No results found for this basename: LIPASE, AMYLASE,  in the last 168 hours No results found for this basename: AMMONIA,  in the last 168 hours CBC:  Recent Labs Lab 08/01/13 1106  WBC 10.6*  NEUTROABS 6.7  HGB 12.1  HCT 37.2  MCV 82.9  PLT 461*   Cardiac Enzymes:  Recent Labs Lab 08/01/13 1106  08/01/13 1812 08/01/13 2314 08/02/13 0500  TROPONINI <0.30 <0.30 <0.30 <0.30   BNP (last 3 results)  Recent Labs  08/01/13 1129  PROBNP 202.3*   CBG:  Recent Labs Lab 08/02/13 1213  GLUCAP 115*    No results found for this or any previous visit (from the past 240 hour(s)).   Studies: Dg Chest 2 View  08/01/2013   CLINICAL DATA:  67 year old female with chest pain and pressure.  EXAM: CHEST  2 VIEW  COMPARISON:  None.  FINDINGS: Low lung volumes. Normal cardiac size and mediastinal contours. Visualized tracheal air column is within normal limits. No pneumothorax. No pleural effusion or consolidation. Mild increased interstitial markings, favor pulmonary vascular congestion. No overt pulmonary edema. No acute osseous abnormality identified. Staple lines in the ventral upper abdomen.  IMPRESSION: Low lung volumes with pulmonary vascular congestion.   Electronically Signed   By: Augusto Gamble   On: 08/01/2013 11:55   Ct Angio Chest Pe W/cm &/or Wo Cm  08/01/2013   CLINICAL DATA:  67 year old female with central chest pain, shortness of breath, lightheadedness.  EXAM: CT ANGIOGRAPHY CHEST WITH CONTRAST  TECHNIQUE: Multidetector CT imaging of the chest was performed using the standard protocol during bolus administration of intravenous contrast. Multiplanar CT image reconstructions including MIPs were obtained to evaluate the vascular anatomy.  CONTRAST:  OMNIPAQUE IOHEXOL 350 MG/ML SOLN  COMPARISON:  Chest radiographs from earlier the same day.  FINDINGS: Adequate contrast bolus timing in the pulmonary arterial tree.  No focal filling defect identified in the pulmonary arterial tree to suggest the presence of acute pulmonary embolism.  Atelectatic changes to the trachea. Otherwise Major airways are patent. Dependent mild ground-glass opacity most compatible with atelectasis. Small 4 mm nodule along the confluence of the right major and minor fissures. No pulmonary consolidation.  No acute  osseous abnormality identified.  Negative thoracic inlet. No pericardial effusion. Small bilateral hilar lymph nodes right greater than left. No mediastinal lymphadenopathy.  Postoperative changes to the left upper quadrant partially visible. Evidence of splenectomy and subsequent splenosis. Postoperative changes to the stomach partially visible. Suggestion of diffuse decreased density in the liver.  Visualized aorta is patent and within normal limits.  Review of the MIP images confirms the above findings.  IMPRESSION: 1. No evidence of acute pulmonary embolus.  2. Pulmonary atelectasis. Mild reactive appearing hilar and mediastinal lymphadenopathy.  3. Postoperative changes in the left upper quadrant.   Electronically Signed   By: Augusto Gamble   On: 08/01/2013 13:21    Scheduled Meds: . enoxaparin (LOVENOX) injection  60 mg Subcutaneous Q24H  . [START ON 08/03/2013] furosemide  40 mg Intravenous Daily  . insulin aspart  0-9 Units Subcutaneous TID WC  . living well with diabetes book   Does not apply Once  . nortriptyline  25-75 mg Oral QHS  . pantoprazole  40 mg Oral Daily  . sodium chloride  3 mL Intravenous Q12H   Continuous Infusions:   Active Problems:   * No active hospital problems. *    Time spent: 35  min    Satia Winger  [facility] Pager [phone] If 7PM-7AM, please contact night-coverage at [email or web address], password [facility] 08/02/2013, 12:37 PM  LOS: 1 day

## 2013-08-03 DIAGNOSIS — I5041 Acute combined systolic (congestive) and diastolic (congestive) heart failure: Secondary | ICD-10-CM

## 2013-08-03 DIAGNOSIS — I509 Heart failure, unspecified: Secondary | ICD-10-CM

## 2013-08-03 LAB — BASIC METABOLIC PANEL
Calcium: 9.4 mg/dL (ref 8.4–10.5)
Creatinine, Ser: 0.93 mg/dL (ref 0.50–1.10)
GFR calc non Af Amer: 63 mL/min — ABNORMAL LOW (ref >90)
Glucose, Bld: 108 mg/dL — ABNORMAL HIGH (ref 70–99)
Sodium: 135 mEq/L (ref 135–145)

## 2013-08-03 LAB — GLUCOSE, CAPILLARY: Glucose-Capillary: 88 mg/dL (ref 70–99)

## 2013-08-03 MED ORDER — POLYETHYLENE GLYCOL 3350 17 G PO PACK
17.0000 g | PACK | Freq: Every day | ORAL | Status: DC
  Administered 2013-08-03: 17 g via ORAL
  Filled 2013-08-03 (×2): qty 1

## 2013-08-03 MED ORDER — SENNOSIDES 8.8 MG/5ML PO SYRP
5.0000 mL | ORAL_SOLUTION | Freq: Two times a day (BID) | ORAL | Status: DC
  Administered 2013-08-03 – 2013-08-04 (×2): 5 mL via ORAL
  Filled 2013-08-03 (×5): qty 5

## 2013-08-03 NOTE — Progress Notes (Signed)
This morning patient started watching the diabetes education videos. Nurse taught patient how to start each video. Patient very eager to learn. Patient starting with 501

## 2013-08-03 NOTE — Progress Notes (Signed)
    Subjective:  No CP or dyspnea. Concerned about leg swelling and rash. Also concerned about headache she experienced at home which prompted her to come to the hospital.  Objective:  Vital Signs in the last 24 hours: Temp:  [97.7 F (36.5 C)-98.1 F (36.7 C)] 97.7 F (36.5 C) (09/05 0552) Pulse Rate:  [72-85] 72 (09/05 0552) Resp:  [16-18] 16 (09/05 0552) BP: (121-128)/(58-63) 127/61 mmHg (09/05 0552) SpO2:  [94 %-97 %] 97 % (09/05 0552) Weight:  [298 lb 14.4 oz (135.58 kg)] 298 lb 14.4 oz (135.58 kg) (09/05 0552)  Intake/Output from previous day: 09/04 0701 - 09/05 0700 In: 1080 [P.O.:1080] Out: 1050 [Urine:1050]  Physical Exam: Pt is alert and oriented, pleasant obese woman in NAD HEENT: normal Neck: JVP - normal, carotids 2+= without bruits Lungs: CTA bilaterally CV: RRR without murmur or gallop Abd: soft, NT, Positive BS, no hepatomegaly Ext: 2+ pretibial edema bilaterally with pretibial rash noted Skin: warm/dry   Lab Results:  Recent Labs  08/01/13 1106  WBC 10.6*  HGB 12.1  PLT 461*    Recent Labs  08/01/13 1106  NA 136  K 3.7  CL 100  CO2 25  GLUCOSE 138*  BUN 17  CREATININE 0.66    Recent Labs  08/01/13 2314 08/02/13 0500  TROPONINI <0.30 <0.30    Cardiac Studies: 2D Echo: Left ventricle: The cavity size was normal. Wall thickness was normal. Systolic function was mildly reduced. The estimated ejection fraction was in the range of 45% to 50%. Diffuse hypokinesis. Doppler parameters are consistent with abnormal left ventricular relaxation (grade 1 diastolic dysfunction).  ------------------------------------------------------------ Aortic valve: Trileaflet. Doppler: There was no stenosis. No regurgitation.  ------------------------------------------------------------ Aorta: Aortic root: The aortic root was normal in size. Ascending aorta: The ascending aorta was normal in  size.  ------------------------------------------------------------ Mitral valve: Mildly calcified annulus. Doppler: There was no evidence for stenosis. No significant regurgitation. Peak gradient: 4mm Hg (D).  ------------------------------------------------------------ Left atrium: The atrium was mildly dilated.  ------------------------------------------------------------ Right ventricle: The cavity size was normal. Systolic function was normal.  ------------------------------------------------------------ Pulmonic valve: Structurally normal valve. Cusp separation was normal. Doppler: Transvalvular velocity was within the normal range. No regurgitation.  ------------------------------------------------------------ Tricuspid valve: Doppler: Trivial regurgitation.  ------------------------------------------------------------ Right atrium: The atrium was normal in size.  ------------------------------------------------------------ Pericardium: There was no pericardial effusion.  ------------------------------------------------------------ Systemic veins: IVC not visualized.  Tele: Sinus rhythm, no significant arrhythmia, personally reviewed  Assessment/Plan:  1. Atypical chest pain, resolved. Agree with Dr Patty Sermons no further eval indicated at this time.   2. Acute systolic/diastolic CHF. Suspect primarily related to morbid obesity. Agree with initiation of carvedilol and lisinopril at low dose. IV lasix has been given and would favor further diuresis today with conversion to oral lasix tomorrow. Should be ok for discharge tomorrow.  3. Morbid obesity - we discussed weight loss strategies. She understands importance of this.   Tonny Bollman, M.D. 08/03/2013, 9:00 AM

## 2013-08-03 NOTE — Progress Notes (Signed)
[facility] PROGRESS NOTE  Harika Laidlaw ZOX:[id number] DOB: [birth year] DOA: 08/01/2013 PCP: No primary provider on file.  Assessment/Plan: 1. Atypical chest pain: resolved.  cardian enzymes negative. EKG no acute st t wave changes.   2. Newly diagnosed combined systolic and diastolic heart failure: - resume lasix, added lisinopril and coreg.  - cardiology recommendations appreciated.  - fluid restriction - I/O last 3 completed shifts: In: 1560 [P.O.:1560] Out: 2000 [Urine:2000] Total I/O In: 560 [P.O.:560] Out: 1400 [Urine:1400]     3. Chronic back pain: home health PT and Pain control.   DVT prophylaxis.    Consultants:  cardiology Procedures:  Echo  Venous duplex  Antibiotics:  none  HPI/Subjective: Chest pain free  Objective: Filed Vitals:   08/03/13 1348  BP: 122/64  Pulse: 72  Temp: 97.8 F (36.6 C)  Resp: 18    Intake/Output Summary (Last 24 hours) at 08/03/13 1834 Last data filed at 08/03/13 1642  Gross per 24 hour  Intake   1040 ml  Output   2450 ml  Net  -1410 ml   Filed Weights   08/01/13 1700 08/02/13 0551 08/03/13 0552  Weight: 138.8 kg (306 lb) 137.5 kg (303 lb 2.1 oz) 135.58 kg (298 lb 14.4 oz)    Exam:   General:  Alert afebrile comfortable  Cardiovascular: s1s2  Respiratory: CTab  Abdomen: SOFT nt nd bs+  Musculoskeletal: 2+ pedal edema  Data Reviewed: Basic Metabolic Panel:  Recent Labs Lab 08/01/13 1106 08/03/13 1547  NA 136 135  K 3.7 3.7  CL 100 95*  CO2 25 32  GLUCOSE 138* 108*  BUN 17 17  CREATININE 0.66 0.93  CALCIUM 9.1 9.4   Liver Function Tests:  Recent Labs Lab 08/01/13 1106  AST 19  ALT 20  ALKPHOS 115  BILITOT 0.3  PROT 7.5  ALBUMIN 3.5   No results found for this basename: LIPASE, AMYLASE,  in the last 168 hours No results found for this basename: AMMONIA,  in the last 168 hours CBC:  Recent Labs Lab 08/01/13 1106  WBC 10.6*  NEUTROABS 6.7  HGB 12.1  HCT 37.2  MCV  82.9  PLT 461*   Cardiac Enzymes:  Recent Labs Lab 08/01/13 1106 08/01/13 1812 08/01/13 2314 08/02/13 0500  TROPONINI <0.30 <0.30 <0.30 <0.30   BNP (last 3 results)  Recent Labs  08/01/13 1129  PROBNP 202.3*   CBG:  Recent Labs Lab 08/02/13 1630 08/02/13 2111 08/03/13 0711 08/03/13 1150 08/03/13 1645  GLUCAP 159* 130* 118* 148* 96    No results found for this or any previous visit (from the past 240 hour(s)).   Studies: No results found.  Scheduled Meds: . aspirin EC  81 mg Oral Daily  . carvedilol  3.125 mg Oral BID WC  . docusate sodium  100 mg Oral BID  . enoxaparin (LOVENOX) injection  60 mg Subcutaneous Q24H  . furosemide  40 mg Intravenous Daily  . insulin aspart  0-9 Units Subcutaneous TID WC  . lisinopril  2.5 mg Oral Daily  . nortriptyline  25-75 mg Oral QHS  . pantoprazole  40 mg Oral Daily  . polyethylene glycol  17 g Oral Daily  . sennosides  5 mL Oral BID  . simvastatin  10 mg Oral q1800  . sodium chloride  3 mL Intravenous Q12H   Continuous Infusions:   Active Problems:   Acute combined systolic and diastolic congestive heart failure   Type II or unspecified type diabetes mellitus without mention  of complication, uncontrolled    Time spent: 35  min    Kathleen Miller  [facility] Pager [phone] If 7PM-7AM, please contact night-coverage at [email or web address], password [facility] 08/03/2013, 6:34 PM  LOS: 2 days

## 2013-08-04 MED ORDER — GLUCOSE BLOOD VI STRP: ORAL_STRIP | Status: DC

## 2013-08-04 MED ORDER — DSS 100 MG PO CAPS: 100.0000 mg | ORAL_CAPSULE | Freq: Two times a day (BID) | ORAL | Status: DC

## 2013-08-04 MED ORDER — CARVEDILOL 3.125 MG PO TABS: 3.1250 mg | ORAL_TABLET | Freq: Two times a day (BID) | ORAL | Status: DC

## 2013-08-04 MED ORDER — FREESTYLE LANCETS MISC: Status: DC

## 2013-08-04 MED ORDER — ALCOHOL SWABS PADS: MEDICATED_PAD | Status: DC

## 2013-08-04 MED ORDER — LISINOPRIL 2.5 MG PO TABS: 2.5000 mg | ORAL_TABLET | Freq: Every day | ORAL | Status: DC

## 2013-08-04 MED ORDER — TRAMADOL HCL 50 MG PO TABS: 50.0000 mg | ORAL_TABLET | Freq: Four times a day (QID) | ORAL | Status: DC | PRN

## 2013-08-04 MED ORDER — PANTOPRAZOLE SODIUM 40 MG PO TBEC: 40.0000 mg | DELAYED_RELEASE_TABLET | Freq: Every day | ORAL | Status: DC

## 2013-08-04 MED ORDER — FUROSEMIDE 40 MG PO TABS
40.0000 mg | ORAL_TABLET | Freq: Every day | ORAL | Status: DC
  Administered 2013-08-04: 40 mg via ORAL
  Filled 2013-08-04: qty 1

## 2013-08-04 MED ORDER — ASPIRIN EC 81 MG PO TBEC: 81.0000 mg | DELAYED_RELEASE_TABLET | Freq: Every day | ORAL | Status: DC

## 2013-08-04 MED ORDER — FREESTYLE SYSTEM KIT: 1.0000 | PACK | Status: DC | PRN

## 2013-08-04 MED ORDER — POLYETHYLENE GLYCOL 3350 17 G PO PACK: 17.0000 g | PACK | Freq: Every day | ORAL | Status: DC

## 2013-08-04 MED ORDER — METFORMIN HCL 500 MG PO TABS: 500.0000 mg | ORAL_TABLET | Freq: Two times a day (BID) | ORAL | Status: DC

## 2013-08-04 MED ORDER — SIMVASTATIN 10 MG PO TABS: 10.0000 mg | ORAL_TABLET | Freq: Every day | ORAL | Status: DC

## 2013-08-04 MED ORDER — FUROSEMIDE 40 MG PO TABS: 40.0000 mg | ORAL_TABLET | Freq: Every day | ORAL | Status: DC

## 2013-08-04 NOTE — Progress Notes (Signed)
Extensive time spent with patient for discharge teaching. Spoke to pt at length about diet surrounding sodium and glucose. AND new medications. Patient verbalized understanding, very eagar to leave.

## 2013-08-04 NOTE — Discharge Summary (Signed)
Physician Discharge Summary  Kathleen Miller ZOX:[id number] DOB: 1946/02/26 DOA: 08/01/2013  PCP: No primary provider on file.  Admit date: 08/01/2013 Discharge date: 08/04/2013  Time spent: 35 minutes  Recommendations for Outpatient Follow-up:  1. Outpatient physical therapy 2. Aquatic therapy 3. Follow up with PCP 4. Follow up with cardiology as needed.   Discharge Diagnoses:  Active Problems:   Acute combined systolic and diastolic congestive heart failure   Type II or unspecified type diabetes mellitus without mention of complication, uncontrolled   Morbid obesity   Discharge Condition: improved  Diet recommendation: carb modified, low sodium diet   Filed Weights   08/02/13 0551 08/03/13 0552 08/04/13 8119  Weight: 137.5 kg (303 lb 2.1 oz) 135.58 kg (298 lb 14.4 oz) 134.8 kg (297 lb 2.9 oz)    History of present illness:  Kathleen Miller is a 67 y.o. female with prior h/o of chornic back pain, came in for chest pressure started on Monday , non radiating , not associated with activity, no syncope, no pnd or orthopnea. Pedal edema since 4 weeks. No cough or fever. She was recently startedon indomethacin for back pain. Has heartburn. On arrival to ED today, she was chest pain free,. Her d dimer was elevated and she underwent ct angio, was neg for PE. SHE is referred to medical service for evaluation of atypical chest pain.   Hospital Course:   .1.Atypical chest pain: resolved. Probably from the heart burn. Resolved after protonix.  cardian enzymes negative. EKG no acute st t wave changes.  Echo showed combined systolic and diastolic heart failure.   2. Newly diagnosed combined systolic and diastolic heart failure:  - resume lasix, added lisinopril and coreg.  - cardiology recommendations appreciated.  - fluid restriction recommended on discharge.  - follow up with cardiology.   3. Chronic back pain: outpatient PT and Pain control. Stop indomethacin.   4. Newly diagnosed  diabetes mellitus: start metformin.  Outpatient dm education.   5. Morbid obesity: diet restriction and physical therapy   Procedures:  ECHO  Consultations:  CARDIOLOGY    Discharge Exam: Filed Vitals:   08/04/13 0633  BP: 123/61  Pulse: 77  Temp: 98 F (36.7 C)  Resp: 16    General: alert afebrile comfortable Cardiovascular: s1s2 Respiratory: CTAB  Discharge Instructions  Discharge Orders   Future Appointments Provider Department Dept Phone   08/16/2013 10:30 AM [facility] Covering Provider [facility] [place] [phone]   Future Orders Complete By Expires   Ambulatory referral to Nutrition and Diabetic Education  As directed    Comments:     New onset diabetes Type 2.   Diet - low sodium heart healthy  As directed        Medication List    STOP taking these medications       indomethacin 50 MG capsule  Commonly known as:  INDOCIN      TAKE these medications       aspirin EC 81 MG tablet  Take 81 mg by mouth daily.     carvedilol 3.125 MG tablet  Commonly known as:  COREG  Take 1 tablet (3.125 mg total) by mouth 2 (two) times daily with a meal.     cyclobenzaprine 10 MG tablet  Commonly known as:  FLEXERIL  Take 10 mg by mouth 3 (three) times daily as needed for muscle spasms.     DSS 100 MG Caps  Take 100 mg by mouth 2 (two) times daily.  furosemide 40 MG tablet  Commonly known as:  LASIX  Take 1 tablet (40 mg total) by mouth daily.     lisinopril 2.5 MG tablet  Commonly known as:  PRINIVIL,ZESTRIL  Take 1 tablet (2.5 mg total) by mouth daily.     metFORMIN 500 MG tablet  Commonly known as:  GLUCOPHAGE  Take 1 tablet (500 mg total) by mouth 2 (two) times daily with a meal.     nortriptyline 25 MG capsule  Commonly known as:  PAMELOR  Take 25-75 mg by mouth at bedtime.     pantoprazole 40 MG tablet  Commonly known as:  PROTONIX  Take 1 tablet (40 mg total) by mouth daily.     polyethylene glycol packet   Commonly known as:  MIRALAX / GLYCOLAX  Take 17 g by mouth daily.     simvastatin 10 MG tablet  Commonly known as:  ZOCOR  Take 1 tablet (10 mg total) by mouth daily at 6 PM.     traMADol 50 MG tablet  Commonly known as:  ULTRAM  Take 1 tablet (50 mg total) by mouth every 6 (six) hours as needed.       No Known Allergies     Follow-up Information   Follow up with Standley Dakins, MD. (You will need to take with you Photo ID, all medications that you are taking, $20.00 co pay.  Appointment Sept. 18th, 10:30 AM )    Specialty:  Family Medicine   Contact information:   [address] [place] [address] [phone]        The results of significant diagnostics from this hospitalization (including imaging, microbiology, ancillary and laboratory) are listed below for reference.    Significant Diagnostic Studies: Dg Chest 2 View  08/01/2013   CLINICAL DATA:  67 year old female with chest pain and pressure.  EXAM: CHEST  2 VIEW  COMPARISON:  None.  FINDINGS: Low lung volumes. Normal cardiac size and mediastinal contours. Visualized tracheal air column is within normal limits. No pneumothorax. No pleural effusion or consolidation. Mild increased interstitial markings, favor pulmonary vascular congestion. No overt pulmonary edema. No acute osseous abnormality identified. Staple lines in the ventral upper abdomen.  IMPRESSION: Low lung volumes with pulmonary vascular congestion.   Electronically Signed   By: Augusto Gamble   On: 08/01/2013 11:55   Ct Angio Chest Pe W/cm &/or Wo Cm  08/01/2013   CLINICAL DATA:  67 year old female with central chest pain, shortness of breath, lightheadedness.  EXAM: CT ANGIOGRAPHY CHEST WITH CONTRAST  TECHNIQUE: Multidetector CT imaging of the chest was performed using the standard protocol during bolus administration of intravenous contrast. Multiplanar CT image reconstructions including MIPs were obtained to evaluate the vascular anatomy.  CONTRAST:   OMNIPAQUE IOHEXOL 350 MG/ML SOLN  COMPARISON:  Chest radiographs from earlier the same day.  FINDINGS: Adequate contrast bolus timing in the pulmonary arterial tree.  No focal filling defect identified in the pulmonary arterial tree to suggest the presence of acute pulmonary embolism.  Atelectatic changes to the trachea. Otherwise Major airways are patent. Dependent mild ground-glass opacity most compatible with atelectasis. Small 4 mm nodule along the confluence of the right major and minor fissures. No pulmonary consolidation.  No acute osseous abnormality identified.  Negative thoracic inlet. No pericardial effusion. Small bilateral hilar lymph nodes right greater than left. No mediastinal lymphadenopathy.  Postoperative changes to the left upper quadrant partially visible. Evidence of splenectomy and subsequent splenosis. Postoperative changes to the  stomach partially visible. Suggestion of diffuse decreased density in the liver.  Visualized aorta is patent and within normal limits.  Review of the MIP images confirms the above findings.  IMPRESSION: 1. No evidence of acute pulmonary embolus.  2. Pulmonary atelectasis. Mild reactive appearing hilar and mediastinal lymphadenopathy.  3. Postoperative changes in the left upper quadrant.   Electronically Signed   By: Augusto Gamble   On: 08/01/2013 13:21    Microbiology: No results found for this or any previous visit (from the past 240 hour(s)).   Labs: Basic Metabolic Panel:  Recent Labs Lab 08/01/13 1106 08/03/13 1547  NA 136 135  K 3.7 3.7  CL 100 95*  CO2 25 32  GLUCOSE 138* 108*  BUN 17 17  CREATININE 0.66 0.93  CALCIUM 9.1 9.4   Liver Function Tests:  Recent Labs Lab 08/01/13 1106  AST 19  ALT 20  ALKPHOS 115  BILITOT 0.3  PROT 7.5  ALBUMIN 3.5   No results found for this basename: LIPASE, AMYLASE,  in the last 168 hours No results found for this basename: AMMONIA,  in the last 168 hours CBC:  Recent Labs Lab 08/01/13 1106   WBC 10.6*  NEUTROABS 6.7  HGB 12.1  HCT 37.2  MCV 82.9  PLT 461*   Cardiac Enzymes:  Recent Labs Lab 08/01/13 1106 08/01/13 1812 08/01/13 2314 08/02/13 0500  TROPONINI <0.30 <0.30 <0.30 <0.30   BNP: BNP (last 3 results)  Recent Labs  08/01/13 1129  PROBNP 202.3*   CBG:  Recent Labs Lab 08/03/13 0711 08/03/13 1150 08/03/13 1645 08/03/13 2150 08/04/13 0810  GLUCAP 118* 148* 96 88 105*       Signed:  Jonique Kulig  [facility] 08/04/2013, 10:11 AM

## 2013-08-04 NOTE — Progress Notes (Signed)
Physical Therapy Treatment Patient Details Name: Kathleen Miller MRN: [id number] DOB: [birth year] Today's Date: 08/04/2013 Time: 1040-1107 PT Time Calculation (min): 27 min  PT Assessment / Plan / Recommendation  History of Present Illness     PT Comments   Pt discussed at length what would benefit her the most post acute for rehab. We concluded she really needs to start with one on one skilled PT and she prefers aquatic therapy (which is fine), she would then like to transition into a continued program after this which I hope the OP PT center will help her with. She may also need someone to assist with household duties (cleaning, meals etc) and unsure if we have resources in the community she would qualify for if someone could look into this.   Follow Up Recommendations  Outpatient PT (Aquatic therapy, also if can look into any help at home )     Does the patient have the potential to tolerate intense rehabilitation     Barriers to Discharge        Equipment Recommendations       Recommendations for Other Services    Frequency     Progress towards PT Goals Progress towards PT goals: Progressing toward goals  Plan Discharge plan needs to be updated    Precautions / Restrictions Restrictions Weight Bearing Restrictions: No   Pertinent Vitals/Pain She was feeling pretty good when I arrived so far as back pain was reported , she stated she had medication earlier.     Mobility  Bed Mobility Details for Bed Mobility Assistance: up at EOB sitting Transfers Transfers: Sit to Stand;Stand to Sit Sit to Stand: 6: Modified independent (Device/Increase time) Stand to Sit: 6: Modified independent (Device/Increase time) Ambulation/Gait Ambulation/Gait Assistance: 4: Min guard Ambulation Distance (Feet): 120 Feet Assistive device: Rolling walker Ambulation/Gait Assistance Details: slow and steady with slight SOB by end of walka nd noticable fatigue. Pt eagat to get stenght back up.  Gait  Pattern: Step-through pattern;Trunk flexed;Decreased stride length Gait velocity: decreased Stairs: No    Exercises     PT Diagnosis:    PT Problem List:   PT Treatment Interventions:     PT Goals (current goals can now be found in the care plan section)    Visit Information  Last PT Received On: 08/04/13 Assistance Needed: +1    Subjective Data  Subjective: I just wanted to get home and I feel like aquatic therapy and walking worked best.    IT consultant  Cognition Arousal/Alertness: Awake/alert Behavior During Therapy: WFL for tasks assessed/performed Overall Cognitive Status: Within Functional Limits for tasks assessed    Balance     End of Session PT - End of Session Equipment Utilized During Treatment:  (RW her RW) Activity Tolerance: Patient tolerated treatment well Patient left: in bed (sitting EOB)   GP Functional Assessment Tool Used: clinical judgement Functional Limitation: Mobility: Walking and moving around Mobility: Walking and Moving Around Current Status [id number]): At least 1 percent but less than 20 percent impaired, limited or restricted Mobility: Walking and Moving Around Goal Status [id number]): At least 1 percent but less than 20 percent impaired, limited or restricted Mobility: Walking and Moving Around Discharge Status [id number]): At least 1 percent but less than 20 percent impaired, limited or restricted   Marella Bile 08/04/2013, 11:14 AM Marella Bile, PT Pager: [phone] 08/04/2013

## 2013-08-04 NOTE — Progress Notes (Signed)
Subjective:  No chest pain, no SOB.  1liter net diur.  Objective:  Vital Signs in the last 24 hours: Temp:  [97.8 F (36.6 C)-98 F (36.7 C)] 98 F (36.7 C) (09/06 1610) Pulse Rate:  [72-77] 77 (09/06 0633) Resp:  [16-18] 16 (09/06 0633) BP: (114-123)/(46-64) 123/61 mmHg (09/06 0633) SpO2:  [97 %-98 %] 98 % (09/06 0633) Weight:  [134.8 kg (297 lb 2.9 oz)] 134.8 kg (297 lb 2.9 oz) (09/06 9604)  Intake/Output from previous day: 09/05 0701 - 09/06 0700 In: 800 [P.O.:800] Out: 1400 [Urine:1400]   Physical Exam: General: Well developed, well nourished, in no acute distress. Obese Head:  Normocephalic and atraumatic. Lungs: Clear to auscultation and percussion. Distant Heart: Normal S1 and S2.  No murmur, rubs or gallops.  Abdomen: soft, non-tender, positive bowel sounds. Extremities: No clubbing or cyanosis. Trace chronic edema. Neurologic: Alert and oriented x 3.    Lab Results:  Recent Labs  08/01/13 1106  WBC 10.6*  HGB 12.1  PLT 461*    Recent Labs  08/01/13 1106 08/03/13 1547  NA 136 135  K 3.7 3.7  CL 100 95*  CO2 25 32  GLUCOSE 138* 108*  BUN 17 17  CREATININE 0.66 0.93    Recent Labs  08/01/13 2314 08/02/13 0500  TROPONINI <0.30 <0.30   Hepatic Function Panel  Recent Labs  08/01/13 1106  PROT 7.5  ALBUMIN 3.5  AST 19  ALT 20  ALKPHOS 115  BILITOT 0.3    Recent Labs  08/02/13 0500  CHOL 185    Telemetry: NSR, PAC Personally viewed.   Cardiac Studies:  EF 45%-50%  Assessment/Plan:  Active Problems:   Acute combined systolic and diastolic congestive heart failure   Type II or unspecified type diabetes mellitus without mention of complication, uncontrolled   1. Atypical chest pain, resolved. Agree with Dr Patty Sermons no further eval indicated at this time. Likely MSK. Left under breast pinching. Trop neg.  2. Acute systolic/diastolic CHF. Suspect primarily related to morbid obesity. Agree with initiation of carvedilol and  lisinopril at low dose. IV lasix has been given conversion to oral lasix tomorrow. Ok for discharge. Discussed fluid restriction. Minimal EF decrease.   3. Morbid obesity - we discussed weight loss strategies. She understands importance of this.    Kathleen Miller 08/04/2013, 8:14 AM

## 2013-08-16 ENCOUNTER — Ambulatory Visit: Payer: Medicare Other | Attending: Internal Medicine | Admitting: Internal Medicine

## 2013-08-16 ENCOUNTER — Encounter: Payer: Self-pay | Admitting: Internal Medicine

## 2013-08-16 VITALS — BP 107/66 | HR 75 | Temp 98.0°F | Resp 16 | Ht 63.0 in | Wt 285.0 lb

## 2013-08-16 DIAGNOSIS — I509 Heart failure, unspecified: Secondary | ICD-10-CM | POA: Insufficient documentation

## 2013-08-16 DIAGNOSIS — E785 Hyperlipidemia, unspecified: Secondary | ICD-10-CM | POA: Insufficient documentation

## 2013-08-16 DIAGNOSIS — IMO0001 Reserved for inherently not codable concepts without codable children: Secondary | ICD-10-CM

## 2013-08-16 DIAGNOSIS — E119 Type 2 diabetes mellitus without complications: Secondary | ICD-10-CM | POA: Insufficient documentation

## 2013-08-16 DIAGNOSIS — K219 Gastro-esophageal reflux disease without esophagitis: Secondary | ICD-10-CM | POA: Insufficient documentation

## 2013-08-16 DIAGNOSIS — I5041 Acute combined systolic (congestive) and diastolic (congestive) heart failure: Secondary | ICD-10-CM

## 2013-08-16 DIAGNOSIS — I5040 Unspecified combined systolic (congestive) and diastolic (congestive) heart failure: Secondary | ICD-10-CM | POA: Insufficient documentation

## 2013-08-16 MED ORDER — FAMOTIDINE 20 MG PO TABS: 20.0000 mg | ORAL_TABLET | Freq: Two times a day (BID) | ORAL | Status: DC | PRN

## 2013-08-16 MED ORDER — FUROSEMIDE 40 MG PO TABS: 40.0000 mg | ORAL_TABLET | Freq: Every day | ORAL | Status: DC

## 2013-08-16 MED ORDER — CARVEDILOL 3.125 MG PO TABS: 3.1250 mg | ORAL_TABLET | Freq: Two times a day (BID) | ORAL | Status: DC

## 2013-08-16 MED ORDER — LOVASTATIN 20 MG PO TABS: 20.0000 mg | ORAL_TABLET | Freq: Every day | ORAL | Status: DC

## 2013-08-16 MED ORDER — FREESTYLE SYSTEM KIT: 1.0000 | PACK | Status: DC | PRN

## 2013-08-16 MED ORDER — METFORMIN HCL 500 MG PO TABS: 500.0000 mg | ORAL_TABLET | Freq: Two times a day (BID) | ORAL | Status: DC

## 2013-08-16 MED ORDER — LISINOPRIL 2.5 MG PO TABS: 2.5000 mg | ORAL_TABLET | Freq: Every day | ORAL | Status: DC

## 2013-08-16 NOTE — Progress Notes (Signed)
Patient ID: Kathleen Miller, female   DOB: [birth year], 67 y.o.   MRN: [id number] Patient Demographics  Kathleen Miller, is a 67 y.o. female  WUJ:[id number]  NFA:[id number]  DOB - 13-Jun-1946  Chief Complaint  Patient presents with  . Establish Care  . Hypertension  . Diabetes  . Congestive Heart Failure        Subjective:   Kathleen Miller today is here to establish primary care.  Patient was recently admitted to [facility] on 9/3 with acute CHF, was attributed to long-standing history of hypertension, diabetes and fluid retention secondary to indomethacin. 2-D echo showed EF of 45-50% with diffuse hypokinesis, grade 1 diastolic dysfunction. Patient has an appointment with [facility]ology on 08/31/13. Patient has No headache, No chest pain, No abdominal pain - No Nausea, No new weakness tingling or numbness, No Cough - SOB. She feels that she has significantly improved from the time of admission with the medications. Patient has not been able to afford Protonix ($67 at [facility] pharmacy), simvastatin ($20), Wants to change them .  Objective:    Filed Vitals:   08/16/13 1102  BP: 107/66  Pulse: 75  Temp: 98 F (36.7 C)  TempSrc: Oral  Resp: 16  Height: 5\' 3"  (1.6 m)  Weight: 285 lb (129.275 kg)  SpO2: 97%     ALLERGIES:  No Known Allergies  PAST MEDICAL HISTORY: Past Medical History  Diagnosis Date  . Chronic back pain   . Depression   . Hypertension   . Diabetes mellitus without complication     PAST SURGICAL HISTORY: Past Surgical History  Procedure Laterality Date  . Abdominal hysterectomy  11/2011  . Cholecystectomy  1988  . Splenectomy, total  1988  . Gastric bypass      FAMILY HISTORY: History reviewed. No pertinent family history.  MEDICATIONS AT HOME: Prior to Admission medications   Medication Sig Start Date End Date Taking? Authorizing Provider  Alcohol Swabs PADS 100 08/04/13  Yes Kathlen Mody, MD  aspirin EC 81 MG tablet Take 1  tablet (81 mg total) by mouth daily. 08/04/13  Yes Kathlen Mody, MD  carvedilol (COREG) 3.125 MG tablet Take 1 tablet (3.125 mg total) by mouth 2 (two) times daily with a meal. 08/16/13  Yes Delynn Olvera K Mataeo Ingwersen, MD  cyclobenzaprine (FLEXERIL) 10 MG tablet Take 10 mg by mouth 3 (three) times daily as needed for muscle spasms.   Yes Historical Provider, MD  docusate sodium 100 MG CAPS Take 100 mg by mouth 2 (two) times daily. 08/04/13  Yes Kathlen Mody, MD  furosemide (LASIX) 40 MG tablet Take 1 tablet (40 mg total) by mouth daily. 08/16/13  Yes Demri Poulton K Hayzlee Mcsorley, MD  glucose blood test strip Use as instructed 08/04/13  Yes Kathlen Mody, MD  glucose monitoring kit (FREESTYLE) monitoring kit 1 each by Does not apply route as needed for other. Please check cbg's twice a day. 08/04/13  Yes Kathlen Mody, MD  Lancets (FREESTYLE) lancets Use as instructed 08/04/13  Yes Kathlen Mody, MD  lisinopril (PRINIVIL,ZESTRIL) 2.5 MG tablet Take 1 tablet (2.5 mg total) by mouth daily. 08/16/13  Yes Rilee Wendling Jenna Luo, MD  metFORMIN (GLUCOPHAGE) 500 MG tablet Take 1 tablet (500 mg total) by mouth 2 (two) times daily with a meal. 08/16/13  Yes Shanece Cochrane K Seva Chancy, MD  nortriptyline (PAMELOR) 25 MG capsule Take 25-75 mg by mouth at bedtime.   Yes Historical Provider, MD  polyethylene glycol (MIRALAX / GLYCOLAX) packet Take 17 g by mouth daily.  08/04/13  Yes Kathlen Mody, MD  traMADol (ULTRAM) 50 MG tablet Take 1 tablet (50 mg total) by mouth every 6 (six) hours as needed. 08/04/13  Yes Kathlen Mody, MD  famotidine (PEPCID) 20 MG tablet Take 1 tablet (20 mg total) by mouth 2 (two) times daily as needed for heartburn. 08/16/13   Katriona Schmierer Jenna Luo, MD  glucose monitoring kit (FREESTYLE) monitoring kit 1 each by Does not apply route as needed for other. 08/16/13   Drevion Offord Jenna Luo, MD  lovastatin (MEVACOR) 20 MG tablet Take 1 tablet (20 mg total) by mouth at bedtime. 08/16/13   Marzelle Rutten Jenna Luo, MD    REVIEW OF SYSTEMS:  Constitutional:   No   Fevers, chills,  fatigue.  HEENT:    No headaches, Sore throat,   Cardio-vascular: No chest pain,  Orthopnea, swelling in lower extremities, anasarca, palpitations  GI:  No abdominal pain, nausea, vomiting, diarrhea  Resp: No shortness of breath,  No coughing up of blood.No cough.No wheezing.  Skin:  no rash or lesions.  GU:  no dysuria, change in color of urine, no urgency or frequency.  No flank pain.  Musculoskeletal: No joint pain or swelling.  No decreased range of motion.  No back pain.  Psych: No change in mood or affect. No depression or anxiety.  No memory loss.   Exam  General appearance :Awake, alert, NAD, Speech Clear. HEENT: Atraumatic and Normocephalic, PERLA Neck: supple, no JVD. No cervical lymphadenopathy.  Chest: clear to auscultation bilaterally, no wheezing, rales or rhonchi CVS: S1 S2 regular, no murmurs.  Abdomen:  obese soft, NBS, NT, ND, no gaurding, rigidity or rebound. Extremities: No cyanosis, clubbing, B/L Lower Ext shows 1+ edema ,  Neurology: Awake alert, and oriented X 3, CN II-XII intact, Non focal Skin:No Rash or lesions Wounds: N/A    Data Review   Basic Metabolic Panel: No results found for this basename: NA, K, CL, CO2, GLUCOSE, BUN, CREATININE, CALCIUM, MG, PHOS,  in the last 168 hours Liver Function Tests: No results found for this basename: AST, ALT, ALKPHOS, BILITOT, PROT, ALBUMIN,  in the last 168 hours  CBC: No results found for this basename: WBC, NEUTROABS, HGB, HCT, MCV, PLT,  in the last 168 hours ------------------------------------------------------------------------------------------------------------------ No results found for this basename: HGBA1C,  in the last 72 hours ------------------------------------------------------------------------------------------------------------------ No results found for this basename: CHOL, HDL, LDLCALC, TRIG, CHOLHDL, LDLDIRECT,  in the last 72  hours ------------------------------------------------------------------------------------------------------------------ No results found for this basename: TSH, T4TOTAL, FREET3, T3FREE, THYROIDAB,  in the last 72 hours ------------------------------------------------------------------------------------------------------------------ No results found for this basename: VITAMINB12, FOLATE, FERRITIN, TIBC, IRON, RETICCTPCT,  in the last 72 hours  Coagulation profile  No results found for this basename: INR, PROTIME,  in the last 168 hours    Assessment & Plan   Active Problems: Recent diagnosis of systolic and diastolic CHF: Currently compensated, Patient symptoms if symptoms improved after starting on medications. She reports that she will run out of her medications before the appointment with cardiology on 08/31/13 - Gave her 1 refills of Lasix, Coreg, lisinopril.  - Await [facility] cardiology appointment if any medications are changed.  Hyperlipidemia: Change to Mevacor, on $4 list at [facility]  GERD - Patient states that it has improved since she has been off indomethacin. - Change to Pepcid as needed  Diabetes mellitus: Refilled Metformin, ordered glucose monitoring kit, she has appointment with diabetic education next month    Recommendations: follow with cardiology on 08/31/2013   Follow-up  in 6 weeks    Damyan Corne M.D. 08/16/2013, 11:26 AM

## 2013-08-16 NOTE — Progress Notes (Signed)
PT ERE TO ESTABLISH CARE  HFU- DIABETIC SHOCK HX HTN,DIABETES,PAIN CONTROL

## 2013-08-31 ENCOUNTER — Encounter: Payer: Self-pay | Admitting: Physician Assistant

## 2013-08-31 ENCOUNTER — Ambulatory Visit (INDEPENDENT_AMBULATORY_CARE_PROVIDER_SITE_OTHER): Payer: Medicare Other | Admitting: Physician Assistant

## 2013-08-31 VITALS — BP 130/78 | HR 73 | Ht 63.0 in | Wt 284.0 lb

## 2013-08-31 DIAGNOSIS — R079 Chest pain, unspecified: Secondary | ICD-10-CM

## 2013-08-31 DIAGNOSIS — I5042 Chronic combined systolic (congestive) and diastolic (congestive) heart failure: Secondary | ICD-10-CM

## 2013-08-31 DIAGNOSIS — I10 Essential (primary) hypertension: Secondary | ICD-10-CM

## 2013-08-31 LAB — BASIC METABOLIC PANEL
BUN: 12 mg/dL (ref 6–23)
CO2: 26 mEq/L (ref 19–32)
Glucose, Bld: 98 mg/dL (ref 70–99)
Potassium: 3.6 mEq/L (ref 3.5–5.1)
Sodium: 136 mEq/L (ref 135–145)

## 2013-08-31 MED ORDER — LISINOPRIL 2.5 MG PO TABS: 2.5000 mg | ORAL_TABLET | Freq: Every evening | ORAL | Status: DC

## 2013-08-31 NOTE — Patient Instructions (Addendum)
CHANGE TAKING THE LISINOPRIL TO EVERY EVENING  LAB TODAY; BMET  PLEASE FOLLOW UP WITH DR. Patty Sermons IN 3 MONTHS

## 2013-08-31 NOTE — Progress Notes (Signed)
[address] [place], [place]  [address] Phone: [phone] Fax:  [phone]  Date:  08/31/2013   ID:  Kathleen Miller, DOB [birth year], MRN [id number]  PCP:  Jeanann Lewandowsky, MD  Cardiologist:  Dr. Cassell Clement     History of Present Illness: Kathleen Miller is a 68 y.o. female who returns for follow up after a recent admission to the hospital.   She has a hx of obesity and chronic back pain.  She was admitted 9/3-9/6 after presenting with chest pressure.  She was seen by cardiology for further evaluation.  Chest CTA was neg for pulmonary embolism.  CXR demonstrated mod pulmo vasc congestion.  BNP was mildly elevated.  CEs were neg.  Echo (08/02/13):  EF 45-50%, diff HK, Gr 1 DD, MAC, mild LAE, normal RVSF, PASP 36.  CP was felt to be atypical and no further workup was recommended.  She would be a poor candidate for a nuclear perfusion study due to body habitus.  She was volume overloaded.  Cardiomyopathy was felt to be related to untreated HTN.  She was diuresed.  CHF meds were adjusted.  She was dx with DM2 and started on Metformin by the admitting service.    She has chronic back pain and walks with a walker.  Activity is limited. She denies any further CP or significant dyspnea.  She has occ palps.  No syncope.  No orthopnea, PND.  LE edema is improved.  She would like to get a treadmill at home to help increase activity.    Labs (9/14):  K 3.7, Cr 0.93, ALT 20, proBNP 202, HDL 37, LDL 126, Hgb 12.1, A1c 6.5  Wt Readings from Last 3 Encounters:  08/31/13 284 lb (128.822 kg)  08/16/13 285 lb (129.275 kg)  08/04/13 297 lb 2.9 oz (134.8 kg)     Past Medical History  Diagnosis Date  . Chronic back pain   . Depression   . Hypertension   . Diabetes mellitus without complication   . Chronic combined systolic and diastolic CHF (congestive heart failure)     a. Echo (08/02/13):  EF 45-50%, diff HK, Gr 1 DD, MAC, mild LAE, normal RVSF, PASP 36;  b. CM likely related to  untreated HTN    Current Outpatient Prescriptions  Medication Sig Dispense Refill  . Alcohol Swabs PADS 100  100 each  0  . aspirin EC 81 MG tablet Take 1 tablet (81 mg total) by mouth daily.  30 tablet  1  . carvedilol (COREG) 3.125 MG tablet Take 1 tablet (3.125 mg total) by mouth 2 (two) times daily with a meal.  60 tablet  1  . cyclobenzaprine (FLEXERIL) 10 MG tablet Take 10 mg by mouth 3 (three) times daily as needed for muscle spasms.      Marland Kitchen docusate sodium 100 MG CAPS Take 100 mg by mouth 2 (two) times daily.  10 capsule  0  . famotidine (PEPCID) 20 MG tablet Take 1 tablet (20 mg total) by mouth 2 (two) times daily as needed for heartburn.  30 tablet  3  . furosemide (LASIX) 40 MG tablet Take 1 tablet (40 mg total) by mouth daily.  30 tablet  1  . glucose blood test strip Use as instructed  100 each  12  . glucose monitoring kit (FREESTYLE) monitoring kit 1 each by Does not apply route as needed for other. Please check cbg's twice a day.  1 each  0  . glucose  monitoring kit (FREESTYLE) monitoring kit 1 each by Does not apply route as needed for other.  1 each  0  . Lancets (FREESTYLE) lancets Use as instructed  100 each  12  . lisinopril (PRINIVIL,ZESTRIL) 2.5 MG tablet Take 1 tablet (2.5 mg total) by mouth daily.  30 tablet  1  . lovastatin (MEVACOR) 20 MG tablet Take 20 mg by mouth at bedtime.      . metFORMIN (GLUCOPHAGE) 500 MG tablet Take 1 tablet (500 mg total) by mouth 2 (two) times daily with a meal.  60 tablet  4  . nortriptyline (PAMELOR) 25 MG capsule Take 25-75 mg by mouth at bedtime.      . polyethylene glycol (MIRALAX / GLYCOLAX) packet Take 17 g by mouth daily.  14 each  0  . traMADol (ULTRAM) 50 MG tablet Take 1 tablet (50 mg total) by mouth every 6 (six) hours as needed.  30 tablet  0   No current facility-administered medications for this visit.    Allergies:   No Known Allergies  Social History:  The patient  reports that she has never smoked. She has never used  smokeless tobacco. She reports that she does not drink alcohol or use illicit drugs.   ROS:  Please see the history of present illness.   She has had a nonproductive cough and occ diarrhea.   All other systems reviewed and negative.   PHYSICAL EXAM: VS:  BP 130/78  Pulse 73  Ht 5\' 3"  (1.6 m)  Wt 284 lb (128.822 kg)  BMI 50.32 kg/m2 Well nourished, well developed, in no acute distress HEENT: normal Neck: no JVD at 90 Cardiac:  normal S1, S2; RRR; no murmur Lungs:  clear to auscultation bilaterally, no wheezing, rhonchi or rales Abd: soft, nontender, no hepatomegaly Ext: trace bilateral LE edema Skin: warm and dry Neuro:  CNs 2-12 intact, no focal abnormalities noted  EKG:  NSR, HR 73, LAD, nonspecific ST-T wave changes     ASSESSMENT AND PLAN:  1. Chronic Combined Systolic and Diastolic CHF:  Volume stable. Continue therapy with beta blocker and ACE inhibitor. She does note occasional dizziness. I advised her to take her ACE inhibitor at bedtime to see if this helps. Check a follow up basic metabolic panel today.  Cardiomyopathy likely related to untreated hypertension. 2. Hypertension:  Controlled. 3. Diabetes Mellitus:  Follow up with PCP. 4. Obesity:  Continue efforts for weight loss. She would like to get a treadmill at home. From a cardiac standpoint, we would recommend increasing activity as much as possible. With her back pain, I have asked her to follow up with her PCP to see what kind of evaluation she would need for a treadmill at home.  For safety reasons, she may need a PT eval.  Defer to PCP.   5. Chest Pain:  Resolved.  No further workup. 6. Disposition:  F/u with Dr. Cassell Clement in 3 mos.   Signed, Tereso Newcomer, PA-C  08/31/2013 9:32 AM

## 2013-09-11 ENCOUNTER — Ambulatory Visit: Payer: Self-pay | Admitting: *Deleted

## 2013-09-13 ENCOUNTER — Ambulatory Visit: Payer: Self-pay

## 2013-10-04 ENCOUNTER — Other Ambulatory Visit: Payer: Self-pay

## 2013-11-02 ENCOUNTER — Telehealth: Payer: Self-pay | Admitting: Emergency Medicine

## 2013-11-02 ENCOUNTER — Other Ambulatory Visit: Payer: Self-pay | Admitting: Emergency Medicine

## 2013-11-02 ENCOUNTER — Telehealth: Payer: Self-pay | Admitting: Internal Medicine

## 2013-11-02 DIAGNOSIS — I5042 Chronic combined systolic (congestive) and diastolic (congestive) heart failure: Secondary | ICD-10-CM

## 2013-11-02 DIAGNOSIS — I10 Essential (primary) hypertension: Secondary | ICD-10-CM

## 2013-11-02 MED ORDER — CARVEDILOL 3.125 MG PO TABS: 3.1250 mg | ORAL_TABLET | Freq: Two times a day (BID) | ORAL | Status: DC

## 2013-11-02 MED ORDER — FUROSEMIDE 40 MG PO TABS: 40.0000 mg | ORAL_TABLET | Freq: Every day | ORAL | Status: DC

## 2013-11-02 MED ORDER — LISINOPRIL 2.5 MG PO TABS: 2.5000 mg | ORAL_TABLET | Freq: Every evening | ORAL | Status: DC

## 2013-11-02 NOTE — Telephone Encounter (Signed)
[facility] pharmacy calling about pt's med refill carvedilol (COREG) 3.125 MG tablet. Please f/u with pharmacy.

## 2013-11-02 NOTE — Telephone Encounter (Signed)
Left voice message for pt to schedule office visit prior to refill Lasix 40 mg tab

## 2013-12-04 ENCOUNTER — Encounter: Payer: Self-pay | Admitting: Internal Medicine

## 2013-12-04 ENCOUNTER — Ambulatory Visit: Payer: Medicare HMO | Attending: Internal Medicine | Admitting: Internal Medicine

## 2013-12-04 VITALS — BP 134/83 | HR 68 | Temp 98.5°F | Resp 14 | Ht 64.0 in | Wt 270.0 lb

## 2013-12-04 DIAGNOSIS — E1165 Type 2 diabetes mellitus with hyperglycemia: Secondary | ICD-10-CM

## 2013-12-04 DIAGNOSIS — IMO0001 Reserved for inherently not codable concepts without codable children: Secondary | ICD-10-CM

## 2013-12-04 DIAGNOSIS — I5042 Chronic combined systolic (congestive) and diastolic (congestive) heart failure: Secondary | ICD-10-CM | POA: Insufficient documentation

## 2013-12-04 DIAGNOSIS — M48061 Spinal stenosis, lumbar region without neurogenic claudication: Secondary | ICD-10-CM | POA: Insufficient documentation

## 2013-12-04 DIAGNOSIS — G8929 Other chronic pain: Secondary | ICD-10-CM

## 2013-12-04 DIAGNOSIS — E119 Type 2 diabetes mellitus without complications: Secondary | ICD-10-CM | POA: Insufficient documentation

## 2013-12-04 DIAGNOSIS — M549 Dorsalgia, unspecified: Secondary | ICD-10-CM

## 2013-12-04 DIAGNOSIS — I509 Heart failure, unspecified: Secondary | ICD-10-CM | POA: Insufficient documentation

## 2013-12-04 DIAGNOSIS — I10 Essential (primary) hypertension: Secondary | ICD-10-CM | POA: Insufficient documentation

## 2013-12-04 LAB — POCT GLYCOSYLATED HEMOGLOBIN (HGB A1C): HEMOGLOBIN A1C: 5.7

## 2013-12-04 LAB — GLUCOSE, POCT (MANUAL RESULT ENTRY): POC GLUCOSE: 100 mg/dL — AB (ref 70–99)

## 2013-12-04 MED ORDER — LISINOPRIL 2.5 MG PO TABS: 2.5000 mg | ORAL_TABLET | Freq: Every evening | ORAL | Status: DC

## 2013-12-04 MED ORDER — CARVEDILOL 3.125 MG PO TABS: 3.1250 mg | ORAL_TABLET | Freq: Two times a day (BID) | ORAL | Status: DC

## 2013-12-04 MED ORDER — METFORMIN HCL 500 MG PO TABS: 500.0000 mg | ORAL_TABLET | Freq: Two times a day (BID) | ORAL | Status: DC

## 2013-12-04 MED ORDER — GLUCOSE BLOOD VI STRP: ORAL_STRIP | Status: DC

## 2013-12-04 MED ORDER — CYCLOBENZAPRINE HCL 10 MG PO TABS: 10.0000 mg | ORAL_TABLET | Freq: Three times a day (TID) | ORAL | Status: DC | PRN

## 2013-12-04 MED ORDER — NORTRIPTYLINE HCL 25 MG PO CAPS: 25.0000 mg | ORAL_CAPSULE | Freq: Every day | ORAL | Status: DC

## 2013-12-04 MED ORDER — LOVASTATIN 20 MG PO TABS: 20.0000 mg | ORAL_TABLET | Freq: Every day | ORAL | Status: DC

## 2013-12-04 MED ORDER — FUROSEMIDE 40 MG PO TABS: 40.0000 mg | ORAL_TABLET | Freq: Every day | ORAL | Status: DC

## 2013-12-04 NOTE — Patient Instructions (Signed)
Back Pain, Adult Low back pain is very common. About 1 in 5 people have back pain.The cause of low back pain is rarely dangerous. The pain often gets better over time.About half of people with a sudden onset of back pain feel better in just 2 weeks. About 8 in 10 people feel better by 6 weeks.  CAUSES Some common causes of back pain include:  Strain of the muscles or ligaments supporting the spine.  Wear and tear (degeneration) of the spinal discs.  Arthritis.  Direct injury to the back. DIAGNOSIS Most of the time, the direct cause of low back pain is not known.However, back pain can be treated effectively even when the exact cause of the pain is unknown.Answering your caregiver's questions about your overall health and symptoms is one of the most accurate ways to make sure the cause of your pain is not dangerous. If your caregiver needs more information, he or she may order lab work or imaging tests (X-rays or MRIs).However, even if imaging tests show changes in your back, this usually does not require surgery. HOME CARE INSTRUCTIONS For many people, back pain returns.Since low back pain is rarely dangerous, it is often a condition that people can learn to manageon their own.   Remain active. It is stressful on the back to sit or stand in one place. Do not sit, drive, or stand in one place for more than 30 minutes at a time. Take short walks on level surfaces as soon as pain allows.Try to increase the length of time you walk each day.  Do not stay in bed.Resting more than 1 or 2 days can delay your recovery.  Do not avoid exercise or work.Your body is made to move.It is not dangerous to be active, even though your back may hurt.Your back will likely heal faster if you return to being active before your pain is gone.  Pay attention to your body when you bend and lift. Many people have less discomfortwhen lifting if they bend their knees, keep the load close to their bodies,and  avoid twisting. Often, the most comfortable positions are those that put less stress on your recovering back.  Find a comfortable position to sleep. Use a firm mattress and lie on your side with your knees slightly bent. If you lie on your back, put a pillow under your knees.  Only take over-the-counter or prescription medicines as directed by your caregiver. Over-the-counter medicines to reduce pain and inflammation are often the most helpful.Your caregiver may prescribe muscle relaxant drugs.These medicines help dull your pain so you can more quickly return to your normal activities and healthy exercise.  Put ice on the injured area.  Put ice in a plastic bag.  Place a towel between your skin and the bag.  Leave the ice on for 15-20 minutes, 03-04 times a day for the first 2 to 3 days. After that, ice and heat may be alternated to reduce pain and spasms.  Ask your caregiver about trying back exercises and gentle massage. This may be of some benefit.  Avoid feeling anxious or stressed.Stress increases muscle tension and can worsen back pain.It is important to recognize when you are anxious or stressed and learn ways to manage it.Exercise is a great option. SEEK MEDICAL CARE IF:  You have pain that is not relieved with rest or medicine.  You have pain that does not improve in 1 week.  You have new symptoms.  You are generally not feeling well. SEEK   IMMEDIATE MEDICAL CARE IF:   You have pain that radiates from your back into your legs.  You develop new bowel or bladder control problems.  You have unusual weakness or numbness in your arms or legs.  You develop nausea or vomiting.  You develop abdominal pain.  You feel faint. Document Released: 11/15/2005 Document Revised: 05/16/2012 Document Reviewed: 04/05/2011 [facility] Patient Information 2014 [place], [place]. Hypertension As your heart beats, it forces blood through your arteries. This force is your blood pressure. If  the pressure is too high, it is called hypertension (HTN) or high blood pressure. HTN is dangerous because you may have it and not know it. High blood pressure may mean that your heart has to work harder to pump blood. Your arteries may be narrow or stiff. The extra work puts you at risk for heart disease, stroke, and other problems.  Blood pressure consists of two numbers, a higher number over a lower, 110/72, for example. It is stated as "110 over 72." The ideal is below 120 for the top number (systolic) and under 80 for the bottom (diastolic). Write down your blood pressure today. You should pay close attention to your blood pressure if you have certain conditions such as:  Heart failure.  Prior heart attack.  Diabetes  Chronic kidney disease.  Prior stroke.  Multiple risk factors for heart disease. To see if you have HTN, your blood pressure should be measured while you are seated with your arm held at the level of the heart. It should be measured at least twice. A one-time elevated blood pressure reading (especially in the Emergency Department) does not mean that you need treatment. There may be conditions in which the blood pressure is different between your right and left arms. It is important to see your caregiver soon for a recheck. Most people have essential hypertension which means that there is not a specific cause. This type of high blood pressure may be lowered by changing lifestyle factors such as:  Stress.  Smoking.  Lack of exercise.  Excessive weight.  Drug/tobacco/alcohol use.  Eating less salt. Most people do not have symptoms from high blood pressure until it has caused damage to the body. Effective treatment can often prevent, delay or reduce that damage. TREATMENT  When a cause has been identified, treatment for high blood pressure is directed at the cause. There are a large number of medications to treat HTN. These fall into several categories, and your  caregiver will help you select the medicines that are best for you. Medications may have side effects. You should review side effects with your caregiver. If your blood pressure stays high after you have made lifestyle changes or started on medicines,   Your medication(s) may need to be changed.  Other problems may need to be addressed.  Be certain you understand your prescriptions, and know how and when to take your medicine.  Be sure to follow up with your caregiver within the time frame advised (usually within two weeks) to have your blood pressure rechecked and to review your medications.  If you are taking more than one medicine to lower your blood pressure, make sure you know how and at what times they should be taken. Taking two medicines at the same time can result in blood pressure that is too low. SEEK IMMEDIATE MEDICAL CARE IF:  You develop a severe headache, blurred or changing vision, or confusion.  You have unusual weakness or numbness, or a faint feeling.  You  have severe chest or abdominal pain, vomiting, or breathing problems. MAKE SURE YOU:   Understand these instructions.  Will watch your condition.  Will get help right away if you are not doing well or get worse. Document Released: 11/15/2005 Document Revised: 02/07/2012 Document Reviewed: 07/05/2008 [facility] Patient Information 2014 [place]. DASH Diet The DASH diet stands for "Dietary Approaches to Stop Hypertension." It is a healthy eating plan that has been shown to reduce high blood pressure (hypertension) in as little as 14 days, while also possibly providing other significant health benefits. These other health benefits include reducing the risk of breast cancer after menopause and reducing the risk of type 2 diabetes, heart disease, colon cancer, and stroke. Health benefits also include weight loss and slowing kidney failure in patients with chronic kidney disease.  DIET GUIDELINES  Limit salt  (sodium). Your diet should contain less than 1500 mg of sodium daily.  Limit refined or processed carbohydrates. Your diet should include mostly whole grains. Desserts and added sugars should be used sparingly.  Include small amounts of heart-healthy fats. These types of fats include nuts, oils, and tub margarine. Limit saturated and trans fats. These fats have been shown to be harmful in the body. CHOOSING FOODS  The following food groups are based on a 2000 calorie diet. See your Registered Dietitian for individual calorie needs. Grains and Grain Products (6 to 8 servings daily)  Eat More Often: Whole-wheat bread, brown rice, whole-grain or wheat pasta, quinoa, popcorn without added fat or salt (air popped).  Eat Less Often: White bread, white pasta, white rice, cornbread. Vegetables (4 to 5 servings daily)  Eat More Often: Fresh, frozen, and canned vegetables. Vegetables may be raw, steamed, roasted, or grilled with a minimal amount of fat.  Eat Less Often/Avoid: Creamed or fried vegetables. Vegetables in a cheese sauce. Fruit (4 to 5 servings daily)  Eat More Often: All fresh, canned (in natural juice), or frozen fruits. Dried fruits without added sugar. One hundred percent fruit juice ( cup [237 mL] daily).  Eat Less Often: Dried fruits with added sugar. Canned fruit in light or heavy syrup. [employer], Fish, and Poultry (2 servings or less daily. One serving is 3 to 4 oz [85-114 g]).  Eat More Often: Ninety percent or leaner ground beef, tenderloin, sirloin. Round cuts of beef, chicken breast, [place] breast. All fish. Grill, bake, or broil your meat. Nothing should be fried.  Eat Less Often/Avoid: Fatty cuts of meat, [place], or chicken leg, thigh, or wing. Fried cuts of meat or fish. Dairy (2 to 3 servings)  Eat More Often: Low-fat or fat-free milk, low-fat plain or light yogurt, reduced-fat or part-skim cheese.  Eat Less Often/Avoid: Milk (whole, 2%).Whole milk yogurt.  Full-fat cheeses. Nuts, Seeds, and Legumes (4 to 5 servings per week)  Eat More Often: All without added salt.  Eat Less Often/Avoid: Salted nuts and seeds, canned beans with added salt. Fats and Sweets (limited)  Eat More Often: Vegetable oils, tub margarines without trans fats, sugar-free gelatin. Mayonnaise and salad dressings.  Eat Less Often/Avoid: Coconut oils, palm oils, butter, stick margarine, cream, half and half, cookies, candy, pie. FOR MORE INFORMATION The Dash Diet Eating Plan: www.dashdiet.org Document Released: 11/04/2011 Document Revised: 02/07/2012 Document Reviewed: 11/04/2011 [facility] Patient Information 2014 [place], [place].

## 2013-12-04 NOTE — Progress Notes (Signed)
Patient ID: Kathleen Miller, female   DOB: 1946-10-20, 68 y.o.   MRN: [id number] Patient Demographics  Kathleen Miller, is a 68 y.o. female  FSF:[id number]  BXI:[id number]  DOB - 07-Nov-1946  Chief Complaint  Patient presents with  . Follow-up        Subjective:   Kathleen Miller is a 68 y.o. female here today for a follow up visit. Patient is known to have depression, hypertension, diabetes mellitus, CHF and chronic back pain, she uses a walker to ambulate. She is here today for refill of her medications. She has no significant complaint. She claims her blood pressure and blood sugar are within acceptable range at home, she is compliant with her medications, she denies chest pain, no blurry vision, no shortness of breath, no PND no orthopnea, leg edema has resolved. She does not smoke cigarette and she does not drink alcohol. Patient has No headache, No chest pain, No abdominal pain - No Nausea, No new weakness tingling or numbness, No Cough - SOB.  ALLERGIES: No Known Allergies  PAST MEDICAL HISTORY: Past Medical History  Diagnosis Date  . Chronic back pain   . Depression   . Hypertension   . Diabetes mellitus without complication   . Chronic combined systolic and diastolic CHF (congestive heart failure)     a. Echo (08/02/13):  EF 45-50%, diff HK, Gr 1 DD, MAC, mild LAE, normal RVSF, PASP 36;  b. CM likely related to untreated HTN    MEDICATIONS AT HOME: Prior to Admission medications   Medication Sig Start Date End Date Taking? Authorizing Provider  Alcohol Swabs PADS 100 08/04/13  Yes Hosie Poisson, MD  aspirin EC 81 MG tablet Take 1 tablet (81 mg total) by mouth daily. 08/04/13  Yes Hosie Poisson, MD  carvedilol (COREG) 3.125 MG tablet Take 1 tablet (3.125 mg total) by mouth 2 (two) times daily with a meal. 12/04/13  Yes Angelica Chessman, MD  cyclobenzaprine (FLEXERIL) 10 MG tablet Take 1 tablet (10 mg total) by mouth 3 (three) times daily as needed for muscle spasms. 12/04/13  Yes  Angelica Chessman, MD  furosemide (LASIX) 40 MG tablet Take 1 tablet (40 mg total) by mouth daily. 12/04/13  Yes Angelica Chessman, MD  glucose monitoring kit (FREESTYLE) monitoring kit 1 each by Does not apply route as needed for other. Please check cbg's twice a day. 08/04/13  Yes Hosie Poisson, MD  glucose monitoring kit (FREESTYLE) monitoring kit 1 each by Does not apply route as needed for other. 08/16/13  Yes Ripudeep Krystal Eaton, MD  Lancets (FREESTYLE) lancets Use as instructed 08/04/13  Yes Hosie Poisson, MD  lisinopril (PRINIVIL,ZESTRIL) 2.5 MG tablet Take 1 tablet (2.5 mg total) by mouth every evening. 12/04/13  Yes Angelica Chessman, MD  lovastatin (MEVACOR) 20 MG tablet Take 1 tablet (20 mg total) by mouth at bedtime. 12/04/13  Yes Angelica Chessman, MD  metFORMIN (GLUCOPHAGE) 500 MG tablet Take 1 tablet (500 mg total) by mouth 2 (two) times daily with a meal. 12/04/13  Yes Angelica Chessman, MD  nortriptyline (PAMELOR) 25 MG capsule Take 1-3 capsules (25-75 mg total) by mouth at bedtime. 12/04/13  Yes Angelica Chessman, MD  docusate sodium 100 MG CAPS Take 100 mg by mouth 2 (two) times daily. 08/04/13   Hosie Poisson, MD  glucose blood test strip Use as instructed 12/04/13   Angelica Chessman, MD  polyethylene glycol (MIRALAX / GLYCOLAX) packet Take 17 g by mouth daily. 08/04/13   Hosie Poisson, MD  Objective:   Filed Vitals:   12/04/13 0947  BP: 134/83  Pulse: 68  Temp: 98.5 F (36.9 C)  TempSrc: Oral  Resp: 14  Height: '5\' 4"'  (1.626 m)  Weight: 270 lb (122.471 kg)  SpO2: 98%    Exam General appearance : Awake, alert, not in any distress. Speech Clear. Not toxic looking, obese HEENT: Atraumatic and Normocephalic, pupils equally reactive to light and accomodation Neck: supple, no JVD. No cervical lymphadenopathy.  Chest:Good air entry bilaterally, no added sounds  CVS: S1 S2 regular, no murmurs.  Abdomen: Bowel sounds present, Non tender and not distended with no gaurding, rigidity or  rebound. Extremities: B/L Lower Ext shows no edema, both legs are warm to touch Neurology: Awake alert, and oriented X 3, CN II-XII intact, Non focal Skin:No Rash Wounds:N/A   Data Review   CBC No results found for this basename: WBC, HGB, HCT, PLT, MCV, MCH, MCHC, RDW, NEUTRABS, LYMPHSABS, MONOABS, EOSABS, BASOSABS, BANDABS, BANDSABD,  in the last 168 hours  Chemistries   No results found for this basename: NA, K, CL, CO2, GLUCOSE, BUN, CREATININE, GFRCGP, CALCIUM, MG, AST, ALT, ALKPHOS, BILITOT,  in the last 168 hours ------------------------------------------------------------------------------------------------------------------ No results found for this basename: HGBA1C,  in the last 72 hours ------------------------------------------------------------------------------------------------------------------ No results found for this basename: CHOL, HDL, LDLCALC, TRIG, CHOLHDL, LDLDIRECT,  in the last 72 hours ------------------------------------------------------------------------------------------------------------------ No results found for this basename: TSH, T4TOTAL, FREET3, T3FREE, THYROIDAB,  in the last 72 hours ------------------------------------------------------------------------------------------------------------------ No results found for this basename: VITAMINB12, FOLATE, FERRITIN, TIBC, IRON, RETICCTPCT,  in the last 72 hours  Coagulation profile  No results found for this basename: INR, PROTIME,  in the last 168 hours    Assessment & Plan   1. Diabetes  - Glucose (CBG) - HgB A1c  2. Chronic combined systolic and diastolic heart failure Refill - lisinopril (PRINIVIL,ZESTRIL) 2.5 MG tablet; Take 1 tablet (2.5 mg total) by mouth every evening.  Dispense: 90 tablet; Refill: 4 - furosemide (LASIX) 40 MG tablet; Take 1 tablet (40 mg total) by mouth daily.  Dispense: 90 tablet; Refill: 4 - carvedilol (COREG) 3.125 MG tablet; Take 1 tablet (3.125 mg total) by  mouth 2 (two) times daily with a meal.  Dispense: 180 tablet; Refill: 4  3. Essential hypertension, benign Controlled, continue - lisinopril (PRINIVIL,ZESTRIL) 2.5 MG tablet; Take 1 tablet (2.5 mg total) by mouth every evening.  Dispense: 90 tablet; Refill: 4  Lab: - CMP and Liver: To evaluate her renal function and potassium level in view of chronic furosemide use  4. Morbid obesity Patient counseled extensively about nutrition and exercise Refill - lovastatin (MEVACOR) 20 MG tablet; Take 1 tablet (20 mg total) by mouth at bedtime.  Dispense: 90 tablet; Refill: 4  5. Type II or unspecified type diabetes mellitus without mention of complication, uncontrolled Refill - metFORMIN (GLUCOPHAGE) 500 MG tablet; Take 1 tablet (500 mg total) by mouth 2 (two) times daily with a meal.  Dispense: 180 tablet; Refill: 4 - glucose blood test strip; Use as instructed  Dispense: 100 each; Refill: 12  6. Back pain, chronic Refill - cyclobenzaprine (FLEXERIL) 10 MG tablet; Take 1 tablet (10 mg total) by mouth 3 (three) times daily as needed for muscle spasms.  Dispense: 60 tablet; Refill: 3 - nortriptyline (PAMELOR) 25 MG capsule; Take 1-3 capsules (25-75 mg total) by mouth at bedtime.  Dispense: 120 capsule; Refill: 3   Follow up in 3 months or when necessary   The patient was  given clear instructions to go to ER or return to medical center if symptoms don't improve, worsen or new problems develop. The patient verbalized understanding. The patient was told to call to get lab results if they haven't heard anything in the next week.    Angelica Chessman, MD, [place], [place], [place] and [place], [place]   12/04/2013, 10:30 AM

## 2013-12-04 NOTE — Progress Notes (Signed)
Pt is here following up on her diabetes and chronic lower back pain and numbness in her right leg.

## 2013-12-05 LAB — CMP AND LIVER
ALK PHOS: 99 U/L (ref 39–117)
ALT: 14 U/L (ref 0–35)
AST: 15 U/L (ref 0–37)
Albumin: 3.8 g/dL (ref 3.5–5.2)
BILIRUBIN TOTAL: 0.5 mg/dL (ref 0.3–1.2)
BUN: 13 mg/dL (ref 6–23)
Bilirubin, Direct: 0.1 mg/dL (ref 0.0–0.3)
CHLORIDE: 102 meq/L (ref 96–112)
CO2: 29 mEq/L (ref 19–32)
CREATININE: 0.71 mg/dL (ref 0.50–1.10)
Calcium: 9.3 mg/dL (ref 8.4–10.5)
Glucose, Bld: 95 mg/dL (ref 70–99)
Indirect Bilirubin: 0.4 mg/dL (ref 0.0–0.9)
Potassium: 4.8 mEq/L (ref 3.5–5.3)
Sodium: 138 mEq/L (ref 135–145)
Total Protein: 6.8 g/dL (ref 6.0–8.3)

## 2013-12-06 ENCOUNTER — Telehealth: Payer: Self-pay | Admitting: *Deleted

## 2013-12-06 NOTE — Telephone Encounter (Signed)
Message copied by Joan Mayans on Thu Dec 06, 2013  9:34 AM ------      Message from: Angelica Chessman E      Created: Wed Dec 05, 2013  5:09 PM       Please inform patient that her lab results are all within normal limit ------

## 2013-12-06 NOTE — Telephone Encounter (Signed)
Called pt and left message

## 2014-03-04 ENCOUNTER — Ambulatory Visit: Payer: Self-pay | Admitting: Internal Medicine

## 2014-03-05 ENCOUNTER — Encounter: Payer: Self-pay | Admitting: Internal Medicine

## 2014-03-05 ENCOUNTER — Ambulatory Visit: Payer: Medicare HMO | Attending: Internal Medicine | Admitting: Internal Medicine

## 2014-03-05 VITALS — BP 127/83 | HR 67 | Temp 98.5°F | Resp 16 | Ht 63.0 in | Wt 263.0 lb

## 2014-03-05 DIAGNOSIS — IMO0001 Reserved for inherently not codable concepts without codable children: Secondary | ICD-10-CM

## 2014-03-05 DIAGNOSIS — I5042 Chronic combined systolic (congestive) and diastolic (congestive) heart failure: Secondary | ICD-10-CM | POA: Insufficient documentation

## 2014-03-05 DIAGNOSIS — E1310 Other specified diabetes mellitus with ketoacidosis without coma: Secondary | ICD-10-CM | POA: Insufficient documentation

## 2014-03-05 DIAGNOSIS — M549 Dorsalgia, unspecified: Secondary | ICD-10-CM | POA: Insufficient documentation

## 2014-03-05 DIAGNOSIS — E1165 Type 2 diabetes mellitus with hyperglycemia: Secondary | ICD-10-CM

## 2014-03-05 DIAGNOSIS — I10 Essential (primary) hypertension: Secondary | ICD-10-CM | POA: Insufficient documentation

## 2014-03-05 DIAGNOSIS — E1110 Type 2 diabetes mellitus with ketoacidosis without coma: Secondary | ICD-10-CM | POA: Insufficient documentation

## 2014-03-05 DIAGNOSIS — G8929 Other chronic pain: Secondary | ICD-10-CM | POA: Insufficient documentation

## 2014-03-05 LAB — GLUCOSE, POCT (MANUAL RESULT ENTRY): POC GLUCOSE: 98 mg/dL (ref 70–99)

## 2014-03-05 LAB — POCT GLYCOSYLATED HEMOGLOBIN (HGB A1C): HEMOGLOBIN A1C: 5.9

## 2014-03-05 MED ORDER — CYCLOBENZAPRINE HCL 10 MG PO TABS: 10.0000 mg | ORAL_TABLET | Freq: Three times a day (TID) | ORAL | Status: DC | PRN

## 2014-03-05 MED ORDER — FUROSEMIDE 40 MG PO TABS: 40.0000 mg | ORAL_TABLET | Freq: Every day | ORAL | Status: DC

## 2014-03-05 MED ORDER — METFORMIN HCL 500 MG PO TABS: 500.0000 mg | ORAL_TABLET | Freq: Two times a day (BID) | ORAL | Status: DC

## 2014-03-05 MED ORDER — LOVASTATIN 20 MG PO TABS: 20.0000 mg | ORAL_TABLET | Freq: Every day | ORAL | Status: DC

## 2014-03-05 MED ORDER — FREESTYLE LANCETS MISC: Status: DC

## 2014-03-05 MED ORDER — GLUCOSE BLOOD VI STRP: ORAL_STRIP | Status: DC

## 2014-03-05 MED ORDER — ALCOHOL SWABS PADS: MEDICATED_PAD | Status: DC

## 2014-03-05 MED ORDER — NORTRIPTYLINE HCL 25 MG PO CAPS: 25.0000 mg | ORAL_CAPSULE | Freq: Every day | ORAL | Status: DC

## 2014-03-05 MED ORDER — CARVEDILOL 3.125 MG PO TABS: 3.1250 mg | ORAL_TABLET | Freq: Two times a day (BID) | ORAL | Status: DC

## 2014-03-05 MED ORDER — LISINOPRIL 2.5 MG PO TABS: 2.5000 mg | ORAL_TABLET | Freq: Every evening | ORAL | Status: DC

## 2014-03-05 NOTE — Progress Notes (Signed)
Pt is following up on her chronic lower back pain, HTN and diabetes.

## 2014-03-10 NOTE — Progress Notes (Signed)
Patient ID: Kathleen Miller, female   DOB: 1946/05/14, 68 y.o.   MRN: [id number]   Kathleen Miller, is a 68 y.o. female  [id number]  [id number]  DOB - 1946-07-03  Chief Complaint  Patient presents with  . Follow-up        Subjective:   Kathleen Miller is a 68 y.o. female here today for a follow up visit. Patient is known to have hypertension, diabetes, chronic combined systolic and diastolic congestive heart ejection fraction of 45%, major depression, chronic back pain. Patient is here today for followup of to get a refill of all her medications. She has no major concerns except for ongoing low back pain which is chronic. No new injury, no urinary or fecal incontinence. She denies any shortness of breath. She is compliant with her medications. She does not smoke cigarettes she does not drink alcohol Patient has No headache, No chest pain, No abdominal pain - No Nausea, No new weakness tingling or numbness, No Cough - SOB.  No problems updated.  ALLERGIES: No Known Allergies  PAST MEDICAL HISTORY: Past Medical History  Diagnosis Date  . Chronic back pain   . Depression   . Hypertension   . Diabetes mellitus without complication   . Chronic combined systolic and diastolic CHF (congestive heart failure)     a. Echo (08/02/13):  EF 45-50%, diff HK, Gr 1 DD, MAC, mild LAE, normal RVSF, PASP 36;  b. CM likely related to untreated HTN    MEDICATIONS AT HOME: Prior to Admission medications   Medication Sig Start Date End Date Taking? Authorizing Provider  Alcohol Swabs PADS 100 03/05/14  Yes Angelica Chessman, MD  aspirin EC 81 MG tablet Take 1 tablet (81 mg total) by mouth daily. 08/04/13  Yes Hosie Poisson, MD  carvedilol (COREG) 3.125 MG tablet Take 1 tablet (3.125 mg total) by mouth 2 (two) times daily with a meal. 03/05/14  Yes Angelica Chessman, MD  cyclobenzaprine (FLEXERIL) 10 MG tablet Take 1 tablet (10 mg total) by mouth 3 (three) times daily as needed for muscle spasms. 03/05/14   Yes Angelica Chessman, MD  furosemide (LASIX) 40 MG tablet Take 1 tablet (40 mg total) by mouth daily. 03/05/14  Yes Angelica Chessman, MD  glucose blood test strip Use as instructed 03/05/14  Yes Angelica Chessman, MD  glucose monitoring kit (FREESTYLE) monitoring kit 1 each by Does not apply route as needed for other. Please check cbg's twice a day. 08/04/13  Yes Hosie Poisson, MD  glucose monitoring kit (FREESTYLE) monitoring kit 1 each by Does not apply route as needed for other. 08/16/13  Yes Ripudeep Krystal Eaton, MD  Lancets (FREESTYLE) lancets Use as instructed 03/05/14  Yes Angelica Chessman, MD  lisinopril (PRINIVIL,ZESTRIL) 2.5 MG tablet Take 1 tablet (2.5 mg total) by mouth every evening. 03/05/14  Yes Angelica Chessman, MD  lovastatin (MEVACOR) 20 MG tablet Take 1 tablet (20 mg total) by mouth at bedtime. 03/05/14  Yes Angelica Chessman, MD  metFORMIN (GLUCOPHAGE) 500 MG tablet Take 1 tablet (500 mg total) by mouth 2 (two) times daily with a meal. 03/05/14  Yes Angelica Chessman, MD  nortriptyline (PAMELOR) 25 MG capsule Take 1-3 capsules (25-75 mg total) by mouth at bedtime. 03/05/14  Yes Angelica Chessman, MD  docusate sodium 100 MG CAPS Take 100 mg by mouth 2 (two) times daily. 08/04/13   Hosie Poisson, MD  polyethylene glycol (MIRALAX / GLYCOLAX) packet Take 17 g by mouth daily. 08/04/13   Hosie Poisson, MD  Objective:   Filed Vitals:   03/05/14 0903  BP: 127/83  Pulse: 67  Temp: 98.5 F (36.9 C)  TempSrc: Oral  Resp: 16  Height: '5\' 3"'  (1.6 m)  Weight: 263 lb (119.296 kg)  SpO2: 96%    Exam General appearance : Awake, alert, not in any distress. Speech Clear. Not toxic looking HEENT: Atraumatic and Normocephalic, pupils equally reactive to light and accomodation Neck: supple, no JVD. No cervical lymphadenopathy.  Chest:Good air entry bilaterally, no added sounds  CVS: S1 S2 regular, no murmurs.  Abdomen: Bowel sounds present, Non tender and not distended with no gaurding, rigidity or  rebound. Extremities: B/L Lower Ext shows no edema, both legs are warm to touch Neurology: Awake alert, and oriented X 3, CN II-XII intact, Non focal Skin:No Rash Wounds:N/A  Data Review Lab Results  Component Value Date   HGBA1C 5.9 03/05/2014   HGBA1C 5.7 12/04/2013   HGBA1C 6.5* 08/01/2013     Assessment & Plan   1. DM (diabetes mellitus) type 2, uncontrolled, with ketoacidosis - Glucose (CBG) - HgB A1c is 5.9%  2. Chronic combined systolic and diastolic heart failure Refill - carvedilol (COREG) 3.125 MG tablet; Take 1 tablet (3.125 mg total) by mouth 2 (two) times daily with a meal.  Dispense: 180 tablet; Refill: 3 - furosemide (LASIX) 40 MG tablet; Take 1 tablet (40 mg total) by mouth daily.  Dispense: 90 tablet; Refill: 3 - lisinopril (PRINIVIL,ZESTRIL) 2.5 MG tablet; Take 1 tablet (2.5 mg total) by mouth every evening.  Dispense: 90 tablet; Refill: 3  3. Essential hypertension, benign Refill - lisinopril (PRINIVIL,ZESTRIL) 2.5 MG tablet; Take 1 tablet (2.5 mg total) by mouth every evening.  Dispense: 90 tablet; Refill: 3  4. Back pain, chronic  - cyclobenzaprine (FLEXERIL) 10 MG tablet; Take 1 tablet (10 mg total) by mouth 3 (three) times daily as needed for muscle spasms.  Dispense: 180 tablet; Refill: 3 - nortriptyline (PAMELOR) 25 MG capsule; Take 1-3 capsules (25-75 mg total) by mouth at bedtime.  Dispense: 120 capsule; Refill: 3  5. Morbid obesity with dyslipidemia Refill - lovastatin (MEVACOR) 20 MG tablet; Take 1 tablet (20 mg total) by mouth at bedtime.  Dispense: 90 tablet; Refill: 3  6. Type II or unspecified type diabetes mellitus without mention of complication, uncontrolled Refill - metFORMIN (GLUCOPHAGE) 500 MG tablet; Take 1 tablet (500 mg total) by mouth 2 (two) times daily with a meal.  Dispense: 180 tablet; Refill: 3 - glucose blood test strip; Use as instructed  Dispense: 100 each; Refill: 12  Patient was counseled extensively on nutrition and  exercise Patient was counseled on medication compliance  Return for Hemoglobin A1C and Follow up, DM, Follow up HTN.  The patient was given clear instructions to go to ER or return to medical center if symptoms don't improve, worsen or new problems develop. The patient verbalized understanding. The patient was told to call to get lab results if they haven't heard anything in the next week.   This note has been created with Surveyor, quantity. Any transcriptional errors are unintentional.    Angelica Chessman, MD, [place], [place], [place] and [facility] [place], [place]   03/10/2014, 10:08 PM

## 2014-06-06 ENCOUNTER — Encounter: Payer: Self-pay | Admitting: Internal Medicine

## 2014-06-06 ENCOUNTER — Ambulatory Visit: Payer: Medicare HMO | Attending: Internal Medicine | Admitting: Internal Medicine

## 2014-06-06 VITALS — BP 116/81 | HR 70 | Temp 98.5°F | Resp 16 | Ht 63.0 in | Wt 264.0 lb

## 2014-06-06 DIAGNOSIS — I10 Essential (primary) hypertension: Secondary | ICD-10-CM | POA: Diagnosis not present

## 2014-06-06 DIAGNOSIS — M545 Low back pain, unspecified: Secondary | ICD-10-CM | POA: Diagnosis present

## 2014-06-06 DIAGNOSIS — I5042 Chronic combined systolic (congestive) and diastolic (congestive) heart failure: Secondary | ICD-10-CM

## 2014-06-06 DIAGNOSIS — Z1239 Encounter for other screening for malignant neoplasm of breast: Secondary | ICD-10-CM | POA: Insufficient documentation

## 2014-06-06 DIAGNOSIS — I509 Heart failure, unspecified: Secondary | ICD-10-CM | POA: Diagnosis not present

## 2014-06-06 DIAGNOSIS — IMO0001 Reserved for inherently not codable concepts without codable children: Secondary | ICD-10-CM

## 2014-06-06 DIAGNOSIS — M549 Dorsalgia, unspecified: Secondary | ICD-10-CM

## 2014-06-06 DIAGNOSIS — K5901 Slow transit constipation: Secondary | ICD-10-CM | POA: Diagnosis present

## 2014-06-06 DIAGNOSIS — F329 Major depressive disorder, single episode, unspecified: Secondary | ICD-10-CM | POA: Diagnosis not present

## 2014-06-06 DIAGNOSIS — G8929 Other chronic pain: Secondary | ICD-10-CM

## 2014-06-06 DIAGNOSIS — E119 Type 2 diabetes mellitus without complications: Secondary | ICD-10-CM | POA: Diagnosis not present

## 2014-06-06 DIAGNOSIS — Z7982 Long term (current) use of aspirin: Secondary | ICD-10-CM | POA: Insufficient documentation

## 2014-06-06 DIAGNOSIS — F3289 Other specified depressive episodes: Secondary | ICD-10-CM | POA: Insufficient documentation

## 2014-06-06 DIAGNOSIS — E1165 Type 2 diabetes mellitus with hyperglycemia: Secondary | ICD-10-CM

## 2014-06-06 LAB — POCT GLYCOSYLATED HEMOGLOBIN (HGB A1C): Hemoglobin A1C: 5.7

## 2014-06-06 LAB — GLUCOSE, POCT (MANUAL RESULT ENTRY): POC GLUCOSE: 119 mg/dL — AB (ref 70–99)

## 2014-06-06 MED ORDER — CARVEDILOL 3.125 MG PO TABS: 3.1250 mg | ORAL_TABLET | Freq: Two times a day (BID) | ORAL | Status: DC

## 2014-06-06 MED ORDER — FUROSEMIDE 40 MG PO TABS: 40.0000 mg | ORAL_TABLET | Freq: Every day | ORAL | Status: DC

## 2014-06-06 MED ORDER — LISINOPRIL 2.5 MG PO TABS: 2.5000 mg | ORAL_TABLET | Freq: Every evening | ORAL | Status: DC

## 2014-06-06 MED ORDER — METFORMIN HCL 500 MG PO TABS: 500.0000 mg | ORAL_TABLET | Freq: Two times a day (BID) | ORAL | Status: DC

## 2014-06-06 MED ORDER — LOVASTATIN 20 MG PO TABS: 20.0000 mg | ORAL_TABLET | Freq: Every day | ORAL | Status: DC

## 2014-06-06 MED ORDER — CYCLOBENZAPRINE HCL 10 MG PO TABS: 10.0000 mg | ORAL_TABLET | Freq: Three times a day (TID) | ORAL | Status: DC | PRN

## 2014-06-06 NOTE — Progress Notes (Signed)
Patient ID: Kathleen Miller, female   DOB: 03-15-1946, 68 y.o.   MRN: [id number]   Kathleen Miller, is a 68 y.o. female  [id number]  [id number]  DOB - 25-Jan-1946  Chief Complaint  Patient presents with  . Follow-up        Subjective:   Kathleen Miller is a 68 y.o. female here today for a follow up visit. Patient is known to have hypertension, diabetes, chronic combined systolic and diastolic congestive heart failure with left ventricular ejection fraction of 45%, major depression, chronic back pain. Patient is here today for followup and refill of all her medications. She has no major concerns except for ongoing low back pain which is chronic and some unexplained weight gain lately. No new injury, no urinary or fecal incontinence. She denies any shortness of breath. She is compliant with her medications. She does not smoke cigarettes, she does not drink alcohol. Patient has No headache, No chest pain, No abdominal pain - No Nausea, No new weakness tingling or numbness, No Cough - SOB.  Problem  Slow Transit Constipation  Breast Cancer Screening    ALLERGIES: No Known Allergies  PAST MEDICAL HISTORY: Past Medical History  Diagnosis Date  . Chronic back pain   . Depression   . Hypertension   . Diabetes mellitus without complication   . Chronic combined systolic and diastolic CHF (congestive heart failure)     a. Echo (08/02/13):  EF 45-50%, diff HK, Gr 1 DD, MAC, mild LAE, normal RVSF, PASP 36;  b. CM likely related to untreated HTN    MEDICATIONS AT HOME: Prior to Admission medications   Medication Sig Start Date End Date Taking? Authorizing Provider  Alcohol Swabs PADS 100 03/05/14  Yes Angelica Chessman, MD  aspirin EC 81 MG tablet Take 1 tablet (81 mg total) by mouth daily. 08/04/13  Yes Hosie Poisson, MD  carvedilol (COREG) 3.125 MG tablet Take 1 tablet (3.125 mg total) by mouth 2 (two) times daily with a meal. 06/06/14  Yes Angelica Chessman, MD  cyclobenzaprine (FLEXERIL)  10 MG tablet Take 1 tablet (10 mg total) by mouth 3 (three) times daily as needed for muscle spasms. 06/06/14  Yes Angelica Chessman, MD  furosemide (LASIX) 40 MG tablet Take 1 tablet (40 mg total) by mouth daily. 06/06/14  Yes Angelica Chessman, MD  glucose blood test strip Use as instructed 03/05/14  Yes Angelica Chessman, MD  glucose monitoring kit (FREESTYLE) monitoring kit 1 each by Does not apply route as needed for other. Please check cbg's twice a day. 08/04/13  Yes Hosie Poisson, MD  glucose monitoring kit (FREESTYLE) monitoring kit 1 each by Does not apply route as needed for other. 08/16/13  Yes Ripudeep Krystal Eaton, MD  Lancets (FREESTYLE) lancets Use as instructed 03/05/14  Yes Angelica Chessman, MD  lisinopril (PRINIVIL,ZESTRIL) 2.5 MG tablet Take 1 tablet (2.5 mg total) by mouth every evening. 06/06/14  Yes Angelica Chessman, MD  lovastatin (MEVACOR) 20 MG tablet Take 1 tablet (20 mg total) by mouth at bedtime. 06/06/14  Yes Angelica Chessman, MD  metFORMIN (GLUCOPHAGE) 500 MG tablet Take 1 tablet (500 mg total) by mouth 2 (two) times daily with a meal. 06/06/14  Yes Angelica Chessman, MD  docusate sodium 100 MG CAPS Take 100 mg by mouth 2 (two) times daily. 08/04/13   Hosie Poisson, MD  nortriptyline (PAMELOR) 25 MG capsule Take 1-3 capsules (25-75 mg total) by mouth at bedtime. 03/05/14   Angelica Chessman, MD  polyethylene glycol (MIRALAX / Floria Raveling)  packet Take 17 g by mouth daily. 08/04/13   Hosie Poisson, MD     Objective:   Filed Vitals:   06/06/14 0907  BP: 116/81  Pulse: 70  Temp: 98.5 F (36.9 C)  TempSrc: Oral  Resp: 16  Height: _0  (1.6 m)  Weight: 264 lb (119.75 kg)  SpO2: 97%    Exam General appearance : Awake, alert, not in any distress. Speech Clear. Not toxic looking HEENT: Atraumatic and Normocephalic, pupils equally reactive to light and accomodation Neck: supple, no JVD. No cervical lymphadenopathy.  Chest:Good air entry bilaterally, no added sounds  CVS: S1 S2 regular, no  murmurs.  Abdomen: Bowel sounds present, Non tender and not distended with no gaurding, rigidity or rebound. Extremities: B/L Lower Ext shows no edema, both legs are warm to touch Neurology: Awake alert, and oriented X 3, CN II-XII intact, Non focal Skin:No Rash Wounds:N/A  Data Review Lab Results  Component Value Date   HGBA1C 5.7 06/06/2014   HGBA1C 5.9 03/05/2014   HGBA1C 5.7 12/04/2013     Assessment & Plan   1. Type 2 diabetes mellitus without complication  - Glucose (CBG) - HgB A1c is 5.7% today  2. Slow transit constipation - HM COLONOSCOPY - Ambulatory referral to Gastroenterology  3. Breast cancer screening  - MM Digital Screening; Future  4. Chronic combined systolic and diastolic heart failure  - carvedilol (COREG) 3.125 MG tablet; Take 1 tablet (3.125 mg total) by mouth 2 (two) times daily with a meal.  Dispense: 180 tablet; Refill: 3 - furosemide (LASIX) 40 MG tablet; Take 1 tablet (40 mg total) by mouth daily.  Dispense: 90 tablet; Refill: 3  5. Back pain, chronic Refill - cyclobenzaprine (FLEXERIL) 10 MG tablet; Take 1 tablet (10 mg total) by mouth 3 (three) times daily as needed for muscle spasms.  Dispense: 180 tablet; Refill: 3  6. Essential hypertension, benign Refill - lisinopril (PRINIVIL,ZESTRIL) 2.5 MG tablet; Take 1 tablet (2.5 mg total) by mouth every evening.  Dispense: 90 tablet; Refill: 3 DASH diet  7. Morbid obesity Refill - lovastatin (MEVACOR) 20 MG tablet; Take 1 tablet (20 mg total) by mouth at bedtime.  Dispense: 90 tablet; Refill: 3 Regular physical exercise at least 3 times a week 30 minutes each time  8. Type II or unspecified type diabetes mellitus without mention of complication, uncontrolled Refill - metFORMIN (GLUCOPHAGE) 500 MG tablet; Take 1 tablet (500 mg total) by mouth 2 (two) times daily with a meal.  Dispense: 180 tablet; Refill: 3  Patient was extensively counseled on nutrition and exercise  Return in about 3 months  (around 09/06/2014), or if symptoms worsen or fail to improve, for Follow up HTN, Abdominal Pain.  The patient was given clear instructions to go to ER or return to medical center if symptoms don't improve, worsen or new problems develop. The patient verbalized understanding. The patient was told to call to get lab results if they haven't heard anything in the next week.   This note has been created with Surveyor, quantity. Any transcriptional errors are unintentional.    Angelica Chessman, MD, [place], [place], [place] and [place], [place]   06/06/2014, 10:00 AM

## 2014-06-06 NOTE — Progress Notes (Signed)
Pt is here following up on her HTN, diabetes and her chronic lower back pain. Pt states that she is having unexplained weight gain.

## 2014-06-06 NOTE — Patient Instructions (Signed)
Diabetes Mellitus and Food It is important for you to manage your blood sugar (glucose) level. Your blood glucose level can be greatly affected by what you eat. Eating healthier foods in the appropriate amounts throughout the day at about the same time each day will help you control your blood glucose level. It can also help slow or prevent worsening of your diabetes mellitus. Healthy eating may even help you improve the level of your blood pressure and reach or maintain a healthy weight.  HOW CAN FOOD AFFECT ME? Carbohydrates Carbohydrates affect your blood glucose level more than any other type of food. Your dietitian will help you determine how many carbohydrates to eat at each meal and teach you how to count carbohydrates. Counting carbohydrates is important to keep your blood glucose at a healthy level, especially if you are using insulin or taking certain medicines for diabetes mellitus. Alcohol Alcohol can cause sudden decreases in blood glucose (hypoglycemia), especially if you use insulin or take certain medicines for diabetes mellitus. Hypoglycemia can be a life-threatening condition. Symptoms of hypoglycemia (sleepiness, dizziness, and disorientation) are similar to symptoms of having too much alcohol.  If your health care provider has given you approval to drink alcohol, do so in moderation and use the following guidelines:  Women should not have more than one drink per day, and men should not have more than two drinks per day. One drink is equal to:  12 oz of beer.  5 oz of wine.  1 oz of hard liquor.  Do not drink on an empty stomach.  Keep yourself hydrated. Have water, diet soda, or unsweetened iced tea.  Regular soda, juice, and other mixers might contain a lot of carbohydrates and should be counted. WHAT FOODS ARE NOT RECOMMENDED? As you make food choices, it is important to remember that all foods are not the same. Some foods have fewer nutrients per serving than other  foods, even though they might have the same number of calories or carbohydrates. It is difficult to get your body what it needs when you eat foods with fewer nutrients. Examples of foods that you should avoid that are high in calories and carbohydrates but low in nutrients include:  Trans fats (most processed foods list trans fats on the Nutrition Facts label).  Regular soda.  Juice.  Candy.  Sweets, such as cake, pie, doughnuts, and cookies.  Fried foods. WHAT FOODS CAN I EAT? Have nutrient-rich foods, which will nourish your body and keep you healthy. The food you should eat also will depend on several factors, including:  The calories you need.  The medicines you take.  Your weight.  Your blood glucose level.  Your blood pressure level.  Your cholesterol level. You also should eat a variety of foods, including:  Protein, such as meat, poultry, fish, tofu, nuts, and seeds (lean animal proteins are best).  Fruits.  Vegetables.  Dairy products, such as milk, cheese, and yogurt (low fat is best).  Breads, grains, pasta, cereal, rice, and beans.  Fats such as olive oil, trans fat-free margarine, canola oil, avocado, and olives. DOES EVERYONE WITH DIABETES MELLITUS HAVE THE SAME MEAL PLAN? Because every person with diabetes mellitus is different, there is not one meal plan that works for everyone. It is very important that you meet with a dietitian who will help you create a meal plan that is just right for you. Document Released: 08/12/2005 Document Revised: 11/20/2013 Document Reviewed: 10/12/2013 [employer] Patient Information 2015 [employer]. This   information is not intended to replace advice given to you by your health care provider. Make sure you discuss any questions you have with your health care provider. Diabetes and Exercise Exercising regularly is important. It is not just about losing weight. It has many health benefits, such as:  Improving your overall  fitness, flexibility, and endurance.  Increasing your bone density.  Helping with weight control.  Decreasing your body fat.  Increasing your muscle strength.  Reducing stress and tension.  Improving your overall health. People with diabetes who exercise gain additional benefits because exercise:  Reduces appetite.  Improves the body's use of blood sugar (glucose).  Helps lower or control blood glucose.  Decreases blood pressure.  Helps control blood lipids (such as cholesterol and triglycerides).  Improves the body's use of the hormone insulin by:  Increasing the body's insulin sensitivity.  Reducing the body's insulin needs.  Decreases the risk for heart disease because exercising:  Lowers cholesterol and triglycerides levels.  Increases the levels of good cholesterol (such as high-density lipoproteins [HDL]) in the body.  Lowers blood glucose levels. YOUR ACTIVITY PLAN  Choose an activity that you enjoy and set realistic goals. Your health care provider or diabetes educator can help you make an activity plan that works for you. You can break activities into 2 or 3 sessions throughout the day. Doing so is as good as one long session. Exercise ideas include:  Taking the dog for a walk.  Taking the stairs instead of the elevator.  Dancing to your favorite song.  Doing your favorite exercise with a friend. RECOMMENDATIONS FOR EXERCISING WITH TYPE 1 OR TYPE 2 DIABETES   Check your blood glucose before exercising. If blood glucose levels are greater than 240 mg/dL, check for urine ketones. Do not exercise if ketones are present.  Avoid injecting insulin into areas of the body that are going to be exercised. For example, avoid injecting insulin into:  The arms when playing tennis.  The legs when jogging.  Keep a record of:  Food intake before and after you exercise.  Expected peak times of insulin action.  Blood glucose levels before and after you  exercise.  The type and amount of exercise you have done.  Review your records with your health care provider. Your health care provider will help you to develop guidelines for adjusting food intake and insulin amounts before and after exercising.  If you take insulin or oral hypoglycemic agents, watch for signs and symptoms of hypoglycemia. They include:  Dizziness.  Shaking.  Sweating.  Chills.  Confusion.  Drink plenty of water while you exercise to prevent dehydration or heat stroke. Body water is lost during exercise and must be replaced.  Talk to your health care provider before starting an exercise program to make sure it is safe for you. Remember, almost any type of activity is better than none. Document Released: 02/05/2004 Document Revised: 07/18/2013 Document Reviewed: 04/24/2013 [facility] Patient Information 2015 [place], [place]. This information is not intended to replace advice given to you by your health care provider. Make sure you discuss any questions you have with your health care provider.

## 2014-07-23 ENCOUNTER — Encounter: Payer: Self-pay | Admitting: Internal Medicine

## 2014-09-16 ENCOUNTER — Ambulatory Visit: Payer: Self-pay | Admitting: Internal Medicine

## 2014-11-26 ENCOUNTER — Ambulatory Visit: Payer: Medicare HMO | Attending: Internal Medicine | Admitting: Internal Medicine

## 2014-11-26 ENCOUNTER — Ambulatory Visit (HOSPITAL_BASED_OUTPATIENT_CLINIC_OR_DEPARTMENT_OTHER): Payer: Medicare HMO | Admitting: *Deleted

## 2014-11-26 ENCOUNTER — Encounter: Payer: Self-pay | Admitting: Internal Medicine

## 2014-11-26 VITALS — BP 116/88 | HR 74 | Temp 98.7°F | Resp 14 | Ht 63.0 in | Wt 272.0 lb

## 2014-11-26 DIAGNOSIS — E1165 Type 2 diabetes mellitus with hyperglycemia: Secondary | ICD-10-CM

## 2014-11-26 DIAGNOSIS — E1310 Other specified diabetes mellitus with ketoacidosis without coma: Secondary | ICD-10-CM | POA: Insufficient documentation

## 2014-11-26 DIAGNOSIS — Z6841 Body Mass Index (BMI) 40.0 and over, adult: Secondary | ICD-10-CM

## 2014-11-26 DIAGNOSIS — E119 Type 2 diabetes mellitus without complications: Secondary | ICD-10-CM

## 2014-11-26 DIAGNOSIS — F329 Major depressive disorder, single episode, unspecified: Secondary | ICD-10-CM | POA: Insufficient documentation

## 2014-11-26 DIAGNOSIS — Z23 Encounter for immunization: Secondary | ICD-10-CM

## 2014-11-26 DIAGNOSIS — G8929 Other chronic pain: Secondary | ICD-10-CM

## 2014-11-26 DIAGNOSIS — Z79899 Other long term (current) drug therapy: Secondary | ICD-10-CM | POA: Diagnosis not present

## 2014-11-26 DIAGNOSIS — Z1231 Encounter for screening mammogram for malignant neoplasm of breast: Secondary | ICD-10-CM

## 2014-11-26 DIAGNOSIS — M549 Dorsalgia, unspecified: Secondary | ICD-10-CM | POA: Insufficient documentation

## 2014-11-26 DIAGNOSIS — I10 Essential (primary) hypertension: Secondary | ICD-10-CM | POA: Diagnosis not present

## 2014-11-26 DIAGNOSIS — I5042 Chronic combined systolic (congestive) and diastolic (congestive) heart failure: Secondary | ICD-10-CM | POA: Insufficient documentation

## 2014-11-26 DIAGNOSIS — Z7982 Long term (current) use of aspirin: Secondary | ICD-10-CM | POA: Insufficient documentation

## 2014-11-26 DIAGNOSIS — E1110 Type 2 diabetes mellitus with ketoacidosis without coma: Secondary | ICD-10-CM

## 2014-11-26 DIAGNOSIS — Z0000 Encounter for general adult medical examination without abnormal findings: Secondary | ICD-10-CM

## 2014-11-26 LAB — GLUCOSE, POCT (MANUAL RESULT ENTRY): POC GLUCOSE: 124 mg/dL — AB (ref 70–99)

## 2014-11-26 LAB — POCT GLYCOSYLATED HEMOGLOBIN (HGB A1C): Hemoglobin A1C: 5.9

## 2014-11-26 MED ORDER — DSS 100 MG PO CAPS: 100.0000 mg | ORAL_CAPSULE | Freq: Two times a day (BID) | ORAL | Status: DC

## 2014-11-26 MED ORDER — NORTRIPTYLINE HCL 25 MG PO CAPS: 25.0000 mg | ORAL_CAPSULE | Freq: Every day | ORAL | Status: DC

## 2014-11-26 MED ORDER — GLUCOSE BLOOD VI STRP: ORAL_STRIP | Status: DC

## 2014-11-26 MED ORDER — FREESTYLE LANCETS MISC: Status: DC

## 2014-11-26 MED ORDER — FREESTYLE SYSTEM KIT: 1.0000 | PACK | Status: DC | PRN

## 2014-11-26 MED ORDER — ASPIRIN EC 81 MG PO TBEC: 81.0000 mg | DELAYED_RELEASE_TABLET | Freq: Every day | ORAL | Status: DC

## 2014-11-26 MED ORDER — FUROSEMIDE 40 MG PO TABS: 40.0000 mg | ORAL_TABLET | Freq: Every day | ORAL | Status: DC

## 2014-11-26 MED ORDER — CARVEDILOL 3.125 MG PO TABS: 3.1250 mg | ORAL_TABLET | Freq: Two times a day (BID) | ORAL | Status: DC

## 2014-11-26 MED ORDER — LISINOPRIL 2.5 MG PO TABS: 2.5000 mg | ORAL_TABLET | Freq: Every evening | ORAL | Status: DC

## 2014-11-26 MED ORDER — CYCLOBENZAPRINE HCL 10 MG PO TABS: 10.0000 mg | ORAL_TABLET | Freq: Three times a day (TID) | ORAL | Status: DC | PRN

## 2014-11-26 MED ORDER — ALCOHOL SWABS PADS: MEDICATED_PAD | Status: DC

## 2014-11-26 MED ORDER — METFORMIN HCL 500 MG PO TABS: 500.0000 mg | ORAL_TABLET | Freq: Two times a day (BID) | ORAL | Status: DC

## 2014-11-26 MED ORDER — LOVASTATIN 20 MG PO TABS: 20.0000 mg | ORAL_TABLET | Freq: Every day | ORAL | Status: DC

## 2014-11-26 NOTE — Progress Notes (Signed)
Patient ID: Kathleen Miller, female   DOB: Oct 09, 1946, 68 y.o.   MRN: [id number]   Kathleen Miller, is a 68 y.o. female  [id number]:[id number]  [id number]  DOB - 07/06/1946  Chief Complaint  Patient presents with  . Follow-up        Subjective:   Kathleen Miller is a 68 y.o. female here today for a follow up visit. Patient is known to have hypertension, diabetes, chronic combined systolic and diastolic congestive heart failure with left ventricular ejection fraction of 45%, major depression, chronic back pain, here today for her routine follow-up and for hemoglobin A1c check. She has no complaint today. She follows also with pain clinic for her back pain, she is being put on pool therapy, but she is not able to use the pool during the entire winter months because she gets sick. Of note is that patient has splenectomy in 1998 and since then has been susceptible to infections especially viral flu and the course of her sickness prolonged than usual. Patient will need a note to this effect so she can only use the pool during summer. She does not smoke cigarettes, she does not drink alcohol. She uses a cane to walk. She is due for mammogram. Patient has No headache, No chest pain, No abdominal pain - No Nausea, No new weakness tingling or numbness, No Cough - SOB.  Problem  Encounter for Screening Mammogram for Breast Cancer    ALLERGIES: No Known Allergies  PAST MEDICAL HISTORY: Past Medical History  Diagnosis Date  . Chronic back pain   . Depression   . Hypertension   . Diabetes mellitus without complication   . Chronic combined systolic and diastolic CHF (congestive heart failure)     a. Echo (08/02/13):  EF 45-50%, diff HK, Gr 1 DD, MAC, mild LAE, normal RVSF, PASP 36;  b. CM likely related to untreated HTN    MEDICATIONS AT HOME: Prior to Admission medications   Medication Sig Start Date End Date Taking? Authorizing Provider  Alcohol Swabs PADS 100 11/26/14  Yes Tresa Garter, MD   aspirin EC 81 MG tablet Take 1 tablet (81 mg total) by mouth daily. 11/26/14  Yes Tresa Garter, MD  carvedilol (COREG) 3.125 MG tablet Take 1 tablet (3.125 mg total) by mouth 2 (two) times daily with a meal. 11/26/14  Yes Tresa Garter, MD  cyclobenzaprine (FLEXERIL) 10 MG tablet Take 1 tablet (10 mg total) by mouth 3 (three) times daily as needed for muscle spasms. 11/26/14  Yes Tresa Garter, MD  furosemide (LASIX) 40 MG tablet Take 1 tablet (40 mg total) by mouth daily. 11/26/14  Yes Tresa Garter, MD  glucose blood test strip Use as instructed 11/26/14  Yes Olugbemiga E Doreene Burke, MD  glucose monitoring kit (FREESTYLE) monitoring kit 1 each by Does not apply route as needed for other. Please check cbg's twice a day. 11/26/14  Yes Tresa Garter, MD  Lancets (FREESTYLE) lancets Use as instructed 11/26/14  Yes Tresa Garter, MD  lisinopril (PRINIVIL,ZESTRIL) 2.5 MG tablet Take 1 tablet (2.5 mg total) by mouth every evening. 11/26/14  Yes Tresa Garter, MD  lovastatin (MEVACOR) 20 MG tablet Take 1 tablet (20 mg total) by mouth at bedtime. 11/26/14  Yes Tresa Garter, MD  metFORMIN (GLUCOPHAGE) 500 MG tablet Take 1 tablet (500 mg total) by mouth 2 (two) times daily with a meal. 11/26/14  Yes Tresa Garter, MD  nortriptyline (PAMELOR) 25 MG capsule Take  1-3 capsules (25-75 mg total) by mouth at bedtime. 11/26/14  Yes Tresa Garter, MD  Docusate Sodium (DSS) 100 MG CAPS Take 100 mg by mouth 2 (two) times daily. 11/26/14   Tresa Garter, MD  polyethylene glycol (MIRALAX / GLYCOLAX) packet Take 17 g by mouth daily. Patient not taking: Reported on 11/26/2014 08/04/13   Hosie Poisson, MD     Objective:   Filed Vitals:   11/26/14 0956  BP: 116/88  Pulse: 74  Temp: 98.7 F (37.1 C)  TempSrc: Oral  Resp: 14  Height: 5' 3" (1.6 m)  Weight: 272 lb (123.378 kg)  SpO2: 94%    Exam General appearance : Awake, alert, not in any distress.  Speech Clear. Not toxic looking HEENT: Atraumatic and Normocephalic, pupils equally reactive to light and accomodation Neck: supple, no JVD. No cervical lymphadenopathy.  Chest:Good air entry bilaterally, no added sounds  CVS: S1 S2 regular, no murmurs.  Abdomen: Bowel sounds present, Non tender and not distended with no gaurding, rigidity or rebound. Extremities: B/L Lower Ext shows no edema, both legs are warm to touch Neurology: Awake alert, and oriented X 3, CN II-XII intact, Non focal Skin:No Rash Wounds:N/A  Data Review Lab Results  Component Value Date   HGBA1C 5.90 11/26/2014   HGBA1C 5.7 06/06/2014   HGBA1C 5.9 03/05/2014     Assessment & Plan   1. Type 2 diabetes mellitus without complication  - Glucose (CBG) - HgB A1c is 5.9 % today not significantly different from before - Alcohol Swabs PADS; 100  Dispense: 100 each; Refill: 3 - glucose monitoring kit (FREESTYLE) monitoring kit; 1 each by Does not apply route as needed for other. Please check cbg's twice a day.  Dispense: 1 each; Refill: 1 - Lancets (FREESTYLE) lancets; Use as instructed  Dispense: 100 each; Refill: 12  -  Aim for 2-3 Carb Choices per meal (30-45 grams) +/- 1 either way  Aim for 0-15 Carbs per snack if hungry  Include protein in moderation with your meals and snacks  Consider reading food labels for Total Carbohydrate and Fat Grams of foods  Consider checking BG at alternate times per day  Continue taking medication as directed Fruit Punch - find one with no sugar  Measure and decrease portions of carbohydrate foods  Make your plate and don't go back for seconds   2. Chronic combined systolic and diastolic heart failure  - carvedilol (COREG) 3.125 MG tablet; Take 1 tablet (3.125 mg total) by mouth 2 (two) times daily with a meal.  Dispense: 180 tablet; Refill: 3 - furosemide (LASIX) 40 MG tablet; Take 1 tablet (40 mg total) by mouth daily.  Dispense: 90 tablet; Refill: 3 - lisinopril  (PRINIVIL,ZESTRIL) 2.5 MG tablet; Take 1 tablet (2.5 mg total) by mouth every evening.  Dispense: 90 tablet; Refill: 3  3. Back pain, chronic  - cyclobenzaprine (FLEXERIL) 10 MG tablet; Take 1 tablet (10 mg total) by mouth 3 (three) times daily as needed for muscle spasms.  Dispense: 180 tablet; Refill: 3 - Docusate Sodium (DSS) 100 MG CAPS; Take 100 mg by mouth 2 (two) times daily.  Dispense: 10 each; Refill: 3 - nortriptyline (PAMELOR) 25 MG capsule; Take 1-3 capsules (25-75 mg total) by mouth at bedtime.  Dispense: 120 capsule; Refill: 3  4. DM (diabetes mellitus) type 2, uncontrolled, with ketoacidosis  - glucose blood test strip; Use as instructed  Dispense: 100 each; Refill: 12 - metFORMIN (GLUCOPHAGE) 500 MG tablet; Take 1 tablet (  500 mg total) by mouth 2 (two) times daily with a meal.  Dispense: 180 tablet; Refill: 3  5. Essential hypertension, benign  - aspirin EC 81 MG tablet; Take 1 tablet (81 mg total) by mouth daily.  Dispense: 90 tablet; Refill: 3 - lisinopril (PRINIVIL,ZESTRIL) 2.5 MG tablet; Take 1 tablet (2.5 mg total) by mouth every evening.  Dispense: 90 tablet; Refill: 3  6. Morbid obesity  - lovastatin (MEVACOR) 20 MG tablet; Take 1 tablet (20 mg total) by mouth at bedtime.  Dispense: 90 tablet; Refill: 3  7. Encounter for screening mammogram for breast cancer  - MM Digital Screening; Future   The patient was counseled extensively about nutrition and exercise.   Return in about 3 months (around 02/25/2015) for Hemoglobin A1C and Follow up, DM, Follow up HTN, Follow up Pain and comorbidities.  The patient was given clear instructions to go to ER or return to medical center if symptoms don't improve, worsen or new problems develop. The patient verbalized understanding. The patient was told to call to get lab results if they haven't heard anything in the next week.   This note has been created with Surveyor, quantity. Any  transcriptional errors are unintentional.    Angelica Chessman, MD, [place], [place], [place] and [place], [place]   11/26/2014, 11:14 AM

## 2014-11-26 NOTE — Patient Instructions (Signed)
Diabetes and Exercise Exercising regularly is important. It is not just about losing weight. It has many health benefits, such as:  Improving your overall fitness, flexibility, and endurance.  Increasing your bone density.  Helping with weight control.  Decreasing your body fat.  Increasing your muscle strength.  Reducing stress and tension.  Improving your overall health. People with diabetes who exercise gain additional benefits because exercise:  Reduces appetite.  Improves the body's use of blood sugar (glucose).  Helps lower or control blood glucose.  Decreases blood pressure.  Helps control blood lipids (such as cholesterol and triglycerides).  Improves the body's use of the hormone insulin by:  Increasing the body's insulin sensitivity.  Reducing the body's insulin needs.  Decreases the risk for heart disease because exercising:  Lowers cholesterol and triglycerides levels.  Increases the levels of good cholesterol (such as high-density lipoproteins [HDL]) in the body.  Lowers blood glucose levels. YOUR ACTIVITY PLAN  Choose an activity that you enjoy and set realistic goals. Your health care provider or diabetes educator can help you make an activity plan that works for you. Exercise regularly as directed by your health care provider. This includes:  Performing resistance training twice a week such as push-ups, sit-ups, lifting weights, or using resistance bands.  Performing 150 minutes of cardio exercises each week such as walking, running, or playing sports.  Staying active and spending no more than 90 minutes at one time being inactive. Even short bursts of exercise are good for you. Three 10-minute sessions spread throughout the day are just as beneficial as a single 30-minute session. Some exercise ideas include:  Taking the dog for a walk.  Taking the stairs instead of the elevator.  Dancing to your favorite song.  Doing an exercise  video.  Doing your favorite exercise with a friend. RECOMMENDATIONS FOR EXERCISING WITH TYPE 1 OR TYPE 2 DIABETES   Check your blood glucose before exercising. If blood glucose levels are greater than 240 mg/dL, check for urine ketones. Do not exercise if ketones are present.  Avoid injecting insulin into areas of the body that are going to be exercised. For example, avoid injecting insulin into:  The arms when playing tennis.  The legs when jogging.  Keep a record of:  Food intake before and after you exercise.  Expected peak times of insulin action.  Blood glucose levels before and after you exercise.  The type and amount of exercise you have done.  Review your records with your health care provider. Your health care provider will help you to develop guidelines for adjusting food intake and insulin amounts before and after exercising.  If you take insulin or oral hypoglycemic agents, watch for signs and symptoms of hypoglycemia. They include:  Dizziness.  Shaking.  Sweating.  Chills.  Confusion.  Drink plenty of water while you exercise to prevent dehydration or heat stroke. Body water is lost during exercise and must be replaced.  Talk to your health care provider before starting an exercise program to make sure it is safe for you. Remember, almost any type of activity is better than none. Document Released: 02/05/2004 Document Revised: 04/01/2014 Document Reviewed: 04/24/2013 ExitCare Patient Information 2015 [employer]. This information is not intended to replace advice given to you by your health care provider. Make sure you discuss any questions you have with your health care provider. Basic Carbohydrate Counting for Diabetes Mellitus Carbohydrate counting is a method for keeping track of the amount of carbohydrates you eat.   Eating carbohydrates naturally increases the level of sugar (glucose) in your blood, so it is important for you to know the amount that is  okay for you to have in every meal. Carbohydrate counting helps keep the level of glucose in your blood within normal limits. The amount of carbohydrates allowed is different for every person. A dietitian can help you calculate the amount that is right for you. Once you know the amount of carbohydrates you can have, you can count the carbohydrates in the foods you want to eat. Carbohydrates are found in the following foods:  Grains, such as breads and cereals.  Dried beans and soy products.  Starchy vegetables, such as potatoes, peas, and corn.  Fruit and fruit juices.  Milk and yogurt.  Sweets and snack foods, such as cake, cookies, candy, chips, soft drinks, and fruit drinks. CARBOHYDRATE COUNTING There are two ways to count the carbohydrates in your food. You can use either of the methods or a combination of both. Reading the "Nutrition Facts" on Packaged Food The "Nutrition Facts" is an area that is included on the labels of almost all packaged food and beverages in the [place]. It includes the serving size of that food or beverage and information about the nutrients in each serving of the food, including the grams (g) of carbohydrate per serving.  Decide the number of servings of this food or beverage that you will be able to eat or drink. Multiply that number of servings by the number of grams of carbohydrate that is listed on the label for that serving. The total will be the amount of carbohydrates you will be having when you eat or drink this food or beverage. Learning Standard Serving Sizes of Food When you eat food that is not packaged or does not include "Nutrition Facts" on the label, you need to measure the servings in order to count the amount of carbohydrates.A serving of most carbohydrate-rich foods contains about 15 g of carbohydrates. The following list includes serving sizes of carbohydrate-rich foods that provide 15 g ofcarbohydrate per serving:   1 slice of bread  (1 oz) or 1 six-inch tortilla.    of a hamburger bun or English muffin.  4-6 crackers.   cup unsweetened dry cereal.    cup hot cereal.   cup rice or pasta.    cup mashed potatoes or  of a large baked potato.  1 cup fresh fruit or one small piece of fruit.    cup canned or frozen fruit or fruit juice.  1 cup milk.   cup plain fat-free yogurt or yogurt sweetened with artificial sweeteners.   cup cooked dried beans or starchy vegetable, such as peas, corn, or potatoes.  Decide the number of standard-size servings that you will eat. Multiply that number of servings by 15 (the grams of carbohydrates in that serving). For example, if you eat 2 cups of strawberries, you will have eaten 2 servings and 30 g of carbohydrates (2 servings x 15 g = 30 g). For foods such as soups and casseroles, in which more than one food is mixed in, you will need to count the carbohydrates in each food that is included. EXAMPLE OF CARBOHYDRATE COUNTING Sample Dinner  3 oz chicken breast.   cup of brown rice.   cup of corn.  1 cup milk.   1 cup strawberries with sugar-free whipped topping.  Carbohydrate Calculation Step 1: Identify the foods that contain carbohydrates:   Rice.   Corn.     Milk.   Strawberries. Step 2:Calculate the number of servings eaten of each:   2 servings of rice.   1 serving of corn.   1 serving of milk.   1 serving of strawberries. Step 3: Multiply each of those number of servings by 15 g:   2 servings of rice x 15 g = 30 g.   1 serving of corn x 15 g = 15 g.   1 serving of milk x 15 g = 15 g.   1 serving of strawberries x 15 g = 15 g. Step 4: Add together all of the amounts to find the total grams of carbohydrates eaten: 30 g + 15 g + 15 g + 15 g = 75 g. Document Released: 11/15/2005 Document Revised: 04/01/2014 Document Reviewed: 10/12/2013 ExitCare Patient Information 2015 [employer]. This information is not intended to  replace advice given to you by your health care provider. Make sure you discuss any questions you have with your health care provider.  

## 2014-11-26 NOTE — Progress Notes (Signed)
Pt is here following up on her HTN. Pt needs her diabetic supply's refilled. Pt has had a cold that she said that she cant get rid of.

## 2015-01-02 ENCOUNTER — Other Ambulatory Visit: Payer: Self-pay

## 2015-01-02 DIAGNOSIS — N63 Unspecified lump in unspecified breast: Secondary | ICD-10-CM

## 2015-01-02 DIAGNOSIS — R234 Changes in skin texture: Secondary | ICD-10-CM

## 2015-03-19 ENCOUNTER — Other Ambulatory Visit: Payer: Self-pay | Admitting: *Deleted

## 2015-03-19 ENCOUNTER — Other Ambulatory Visit: Payer: Self-pay | Admitting: Internal Medicine

## 2015-03-19 ENCOUNTER — Ambulatory Visit (HOSPITAL_COMMUNITY)
Admission: RE | Admit: 2015-03-19 | Discharge: 2015-03-19 | Disposition: A | Discharge status: Home / Self Care | Payer: Medicare HMO | Source: Ambulatory Visit | Attending: Cardiology | Admitting: Cardiology

## 2015-03-19 ENCOUNTER — Ambulatory Visit
Admission: RE | Admit: 2015-03-19 | Discharge: 2015-03-19 | Disposition: A | Discharge status: Home / Self Care | Payer: Medicare HMO | Source: Ambulatory Visit | Attending: Internal Medicine | Admitting: Internal Medicine

## 2015-03-19 ENCOUNTER — Other Ambulatory Visit: Payer: Self-pay

## 2015-03-19 ENCOUNTER — Ambulatory Visit (HOSPITAL_BASED_OUTPATIENT_CLINIC_OR_DEPARTMENT_OTHER): Payer: Medicare HMO | Admitting: Internal Medicine

## 2015-03-19 DIAGNOSIS — E875 Hyperkalemia: Secondary | ICD-10-CM | POA: Insufficient documentation

## 2015-03-19 DIAGNOSIS — Z1231 Encounter for screening mammogram for malignant neoplasm of breast: Secondary | ICD-10-CM | POA: Diagnosis not present

## 2015-03-19 DIAGNOSIS — L298 Other pruritus: Secondary | ICD-10-CM | POA: Insufficient documentation

## 2015-03-19 DIAGNOSIS — Z139 Encounter for screening, unspecified: Secondary | ICD-10-CM

## 2015-03-19 DIAGNOSIS — L309 Dermatitis, unspecified: Secondary | ICD-10-CM

## 2015-03-19 LAB — CBC WITH DIFFERENTIAL/PLATELET
BASOS PCT: 1 % (ref 0–1)
Basophils Absolute: 0.1 10*3/uL (ref 0.0–0.1)
EOS ABS: 0.6 10*3/uL (ref 0.0–0.7)
Eosinophils Relative: 7 % — ABNORMAL HIGH (ref 0–5)
HCT: 37.3 % (ref 36.0–46.0)
HEMOGLOBIN: 12.3 g/dL (ref 12.0–15.0)
Lymphocytes Relative: 24 % (ref 12–46)
Lymphs Abs: 2.1 10*3/uL (ref 0.7–4.0)
MCH: 26.9 pg (ref 26.0–34.0)
MCHC: 33 g/dL (ref 30.0–36.0)
MCV: 81.6 fL (ref 78.0–100.0)
MONOS PCT: 12 % (ref 3–12)
MPV: 10 fL (ref 8.6–12.4)
Monocytes Absolute: 1 10*3/uL (ref 0.1–1.0)
NEUTROS PCT: 56 % (ref 43–77)
Neutro Abs: 4.9 10*3/uL (ref 1.7–7.7)
Platelets: 471 10*3/uL — ABNORMAL HIGH (ref 150–400)
RBC: 4.57 MIL/uL (ref 3.87–5.11)
RDW: 14.5 % (ref 11.5–15.5)
WBC: 8.7 10*3/uL (ref 4.0–10.5)

## 2015-03-19 LAB — BASIC METABOLIC PANEL
BUN: 16 mg/dL (ref 6–23)
CO2: 24 meq/L (ref 19–32)
CREATININE: 0.63 mg/dL (ref 0.50–1.10)
Calcium: 9.3 mg/dL (ref 8.4–10.5)
Chloride: 102 mEq/L (ref 96–112)
Glucose, Bld: 87 mg/dL (ref 70–99)
Potassium: 4.5 mEq/L (ref 3.5–5.3)
Sodium: 136 mEq/L (ref 135–145)

## 2015-03-19 MED ORDER — SODIUM POLYSTYRENE SULFONATE 15 GM/60ML PO SUSP: 30.0000 g | Freq: Once | ORAL | Status: DC

## 2015-03-19 MED ORDER — TRIAMCINOLONE ACETONIDE 0.5 % EX CREA: 1.0000 "application " | TOPICAL_CREAM | Freq: Three times a day (TID) | CUTANEOUS | Status: DC

## 2015-03-19 NOTE — Progress Notes (Signed)
Patient ID: Kathleen Miller, female   DOB: Sep 07, 1946, 69 y.o.   MRN: [id number]   Kathleen Miller, is a 69 y.o. female  [id number]  [id number]  DOB - 1946/09/21  No chief complaint on file.       Subjective:   Kathleen Miller is a 69 y.o. female here today for a follow up visit.  Patient has hypertension, diabetes mellitus, chronic combined systolic and diastolic congestive heart failure with left ejection fraction of 45% , Major depression, chronic back pain and obesity. Patient is here today for blood draw to check potassium level.  She is also due for mammogram. She is requesting medication for her eczema. Patient has No headache, No chest pain, No abdominal pain - No Nausea, No new weakness tingling or numbness, No Cough - SOB.  Problem  Hyperkalemia    ALLERGIES: No Known Allergies  PAST MEDICAL HISTORY: Past Medical History  Diagnosis Date  . Chronic back pain   . Depression   . Hypertension   . Diabetes mellitus without complication   . Chronic combined systolic and diastolic CHF (congestive heart failure)     a. Echo (08/02/13):  EF 45-50%, diff HK, Gr 1 DD, MAC, mild LAE, normal RVSF, PASP 36;  b. CM likely related to untreated HTN    MEDICATIONS AT HOME: Prior to Admission medications   Medication Sig Start Date End Date Taking? Authorizing Provider  Alcohol Swabs PADS 100 11/26/14   Tresa Garter, MD  aspirin EC 81 MG tablet Take 1 tablet (81 mg total) by mouth daily. 11/26/14   Tresa Garter, MD  carvedilol (COREG) 3.125 MG tablet Take 1 tablet (3.125 mg total) by mouth 2 (two) times daily with a meal. 11/26/14   Tresa Garter, MD  cyclobenzaprine (FLEXERIL) 10 MG tablet Take 1 tablet (10 mg total) by mouth 3 (three) times daily as needed for muscle spasms. 11/26/14   Tresa Garter, MD  Docusate Sodium (DSS) 100 MG CAPS Take 100 mg by mouth 2 (two) times daily. 11/26/14   Tresa Garter, MD  furosemide (LASIX) 40 MG tablet Take 1  tablet (40 mg total) by mouth daily. 11/26/14   Tresa Garter, MD  glucose blood test strip Use as instructed 11/26/14   Tresa Garter, MD  glucose monitoring kit (FREESTYLE) monitoring kit 1 each by Does not apply route as needed for other. Please check cbg's twice a day. 11/26/14   Tresa Garter, MD  Lancets (FREESTYLE) lancets Use as instructed 11/26/14   Tresa Garter, MD  lisinopril (PRINIVIL,ZESTRIL) 2.5 MG tablet Take 1 tablet (2.5 mg total) by mouth every evening. 11/26/14   Tresa Garter, MD  lovastatin (MEVACOR) 20 MG tablet Take 1 tablet (20 mg total) by mouth at bedtime. 11/26/14   Tresa Garter, MD  metFORMIN (GLUCOPHAGE) 500 MG tablet Take 1 tablet (500 mg total) by mouth 2 (two) times daily with a meal. 11/26/14   Tresa Garter, MD  nortriptyline (PAMELOR) 25 MG capsule Take 1-3 capsules (25-75 mg total) by mouth at bedtime. 11/26/14   Tresa Garter, MD  polyethylene glycol (MIRALAX / GLYCOLAX) packet Take 17 g by mouth daily. Patient not taking: Reported on 11/26/2014 08/04/13   Hosie Poisson, MD  sodium polystyrene (KAYEXALATE) 15 GM/60ML suspension Take 120 mLs (30 g total) by mouth once. 03/19/15   Tresa Garter, MD  triamcinolone cream (KENALOG) 0.5 % Apply 1 application topically 3 (three) times daily. 03/19/15  Tresa Garter, MD     Objective:   There were no vitals filed for this visit.  Exam General appearance : Awake, alert, not in any distress. Speech Clear. Not toxic looking HEENT: Atraumatic and Normocephalic, pupils equally reactive to light and accomodation Neck: supple, no JVD. No cervical lymphadenopathy.  Chest:Good air entry bilaterally, no added sounds  CVS: S1 S2 regular, no murmurs.  Abdomen: Bowel sounds present, Non tender and not distended with no gaurding, rigidity or rebound. Extremities: B/L Lower Ext shows no edema, both legs are warm to touch Neurology: Awake alert, and oriented X 3, CN  II-XII intact, Non focal  Data Review Lab Results  Component Value Date   HGBA1C 5.90 11/26/2014   HGBA1C 5.7 06/06/2014   HGBA1C 5.9 03/05/2014     Assessment & Plan   1. Hyperkalemia  - Basic Metabolic Panel  - CBC with Differential   - sodium polystyrene (KAYEXALATE) 15 GM/60ML suspension; Take 120 mLs (30 g total) by mouth once.  Dispense: 200 mL; Refill: 0  Patient is to keep this prescription , and wait for instructions to take the medication if her potassium level is high from BMP today.  2. Encounter for screening mammogram for breast cancer  - MM Digital Screening; Future  3. Pruritic erythematous rash  - triamcinolone cream (KENALOG) 0.5 %; Apply 1 application topically 3 (three) times daily.  Dispense: 30 g; Refill: 2  Patient have been counseled extensively about nutrition and exercise Return in about 4 weeks (around 04/16/2015) for Annual Physical, Routine Follow Up.  The patient was given clear instructions to go to ER or return to medical center if symptoms don't improve, worsen or new problems develop. The patient verbalized understanding. The patient was told to call to get lab results if they haven't heard anything in the next week.   This note has been created with Surveyor, quantity. Any transcriptional errors are unintentional.    Angelica Chessman, MD, [place], [place], [place], [place] and [place], [place]   03/19/2015, 1:18 PM

## 2015-03-19 NOTE — Patient Instructions (Signed)
Hyperkalemia Hyperkalemia is when you have too much potassium in your blood. This can be a life-threatening condition. Potassium is normally removed (excreted) from the body by the kidneys. CAUSES  The potassium level in your body can become too high for the following reasons:  You take in too much potassium. You can do this by:  Using salt substitutes. They contain large amounts of potassium.  Taking potassium supplements from your caregiver. The dose may be too high for you.  Eating foods or taking nutritional products with potassium.  You excrete too little potassium. This can happen if:  Your kidneys are not functioning properly. Kidney (renal) disease is a very common cause of hyperkalemia.  You are taking medicines that lower your excretion of potassium, such as certain diuretic medicines.  You have an adrenal gland disease called Addison's disease.  You have a urinary tract obstruction, such as kidney stones.  You are on treatment to mechanically clean your blood (dialysis) and you skip a treatment.  You release a high amount of potassium from your cells into your blood. You may have a condition that causes potassium to move from your cells to your bloodstream. This can happen with:  Injury to muscles or other tissues. Most potassium is stored in the muscles.  Severe burns or infections.  Acidic blood plasma (acidosis). Acidosis can result from many diseases, such as uncontrolled diabetes. SYMPTOMS  Usually, there are no symptoms unless the potassium is dangerously high or has risen very quickly. Symptoms may include:  Irregular or very slow heartbeat.  Feeling sick to your stomach (nauseous).  Tiredness (fatigue).  Nerve problems such as tingling of the skin, numbness of the hands or feet, weakness, or paralysis. DIAGNOSIS  A simple blood test can measure the amount of potassium in your body. An electrocardiogram test of the heart can also help make the diagnosis.  The heart may beat dangerously fast or slow down and stop beating with severe hyperkalemia.  TREATMENT  Treatment depends on how bad the condition is and on the underlying cause.  If the hyperkalemia is an emergency (causing heart problems or paralysis), many different medicines can be used alone or together to lower the potassium level briefly. This may include an insulin injection even if you are not diabetic. Emergency dialysis may be needed to remove potassium from the body.  If the hyperkalemia is less severe or dangerous, the underlying cause is treated. This can include taking medicines if needed. Your prescription medicines may be changed. You may also need to take a medicine to help your body get rid of potassium. You may need to eat a diet low in potassium. HOME CARE INSTRUCTIONS   Take medicines and supplements as directed by your caregiver.  Do not take any over-the-counter medicines, supplements, natural products, herbs, or vitamins without reviewing them with your caregiver. Certain supplements and natural food products can have high amounts of potassium. Other products (such as ibuprofen) can damage weak kidneys and raise your potassium.  You may be asked to do repeat lab tests. Be sure to follow these directions.  If you have kidney disease, you may need to follow a low potassium diet. SEEK MEDICAL CARE IF:   You notice an irregular or very slow heartbeat.  You feel lightheaded.  You develop weakness that is unusual for you. SEEK IMMEDIATE MEDICAL CARE IF:   You have shortness of breath.  You have chest discomfort.  You pass out (faint). MAKE SURE YOU:   Understand   these instructions.  Will watch your condition.  Will get help right away if you are not doing well or get worse. Document Released: 11/05/2002 Document Revised: 02/07/2012 Document Reviewed: 02/20/2014 Exit[employer] Patient Information 2015 [employer]. This information is not intended to replace  advice given to you by your health care provider. Make sure you discuss any questions you have with your health care provider.  

## 2015-03-21 ENCOUNTER — Telehealth: Payer: Self-pay

## 2015-03-21 NOTE — Telephone Encounter (Signed)
Patient not available Left message on voice mail to return our call 

## 2015-03-21 NOTE — Telephone Encounter (Signed)
-----   Message from Tresa Garter, MD sent at 03/21/2015  3:21 PM EDT ----- Please inform patient that her mammogram shows no evidence of malignancy in the right breast. Left breast shows probable complicated cyst, radiologist has recommended follow-up left diagnostic mammogram in 6 months. Advised patient to use the cream prescribed for the rash around the nipple, if rash persists despite this treatment, we will refer her to dermatologist.

## 2015-12-31 ENCOUNTER — Other Ambulatory Visit: Payer: Self-pay | Admitting: Physical Medicine and Rehabilitation

## 2015-12-31 DIAGNOSIS — G4700 Insomnia, unspecified: Secondary | ICD-10-CM | POA: Diagnosis not present

## 2015-12-31 DIAGNOSIS — E663 Overweight: Secondary | ICD-10-CM | POA: Diagnosis not present

## 2015-12-31 DIAGNOSIS — E2749 Other adrenocortical insufficiency: Secondary | ICD-10-CM | POA: Diagnosis not present

## 2015-12-31 DIAGNOSIS — E038 Other specified hypothyroidism: Secondary | ICD-10-CM | POA: Diagnosis not present

## 2015-12-31 DIAGNOSIS — E7800 Pure hypercholesterolemia, unspecified: Secondary | ICD-10-CM | POA: Diagnosis not present

## 2015-12-31 DIAGNOSIS — E539 Vitamin B deficiency, unspecified: Secondary | ICD-10-CM | POA: Diagnosis not present

## 2015-12-31 DIAGNOSIS — E2839 Other primary ovarian failure: Secondary | ICD-10-CM | POA: Diagnosis not present

## 2015-12-31 DIAGNOSIS — E559 Vitamin D deficiency, unspecified: Secondary | ICD-10-CM | POA: Diagnosis not present

## 2015-12-31 DIAGNOSIS — R1012 Left upper quadrant pain: Secondary | ICD-10-CM

## 2016-01-09 ENCOUNTER — Other Ambulatory Visit: Payer: Self-pay

## 2016-01-16 ENCOUNTER — Ambulatory Visit
Admission: RE | Admit: 2016-01-16 | Discharge: 2016-01-16 | Disposition: A | Discharge status: Home / Self Care | Payer: Medicare HMO | Source: Ambulatory Visit | Attending: Physical Medicine and Rehabilitation | Admitting: Physical Medicine and Rehabilitation

## 2016-01-16 DIAGNOSIS — R1900 Intra-abdominal and pelvic swelling, mass and lump, unspecified site: Secondary | ICD-10-CM | POA: Diagnosis not present

## 2016-01-16 DIAGNOSIS — R1012 Left upper quadrant pain: Secondary | ICD-10-CM

## 2016-01-22 ENCOUNTER — Other Ambulatory Visit: Payer: Self-pay | Admitting: Internal Medicine

## 2016-01-26 DIAGNOSIS — M5137 Other intervertebral disc degeneration, lumbosacral region: Secondary | ICD-10-CM | POA: Diagnosis not present

## 2016-01-26 DIAGNOSIS — R1012 Left upper quadrant pain: Secondary | ICD-10-CM | POA: Diagnosis not present

## 2016-01-26 DIAGNOSIS — M25561 Pain in right knee: Secondary | ICD-10-CM | POA: Diagnosis not present

## 2016-01-26 DIAGNOSIS — G894 Chronic pain syndrome: Secondary | ICD-10-CM | POA: Diagnosis not present

## 2016-01-26 DIAGNOSIS — M47815 Spondylosis without myelopathy or radiculopathy, thoracolumbar region: Secondary | ICD-10-CM | POA: Diagnosis not present

## 2016-01-26 DIAGNOSIS — R2689 Other abnormalities of gait and mobility: Secondary | ICD-10-CM | POA: Diagnosis not present

## 2016-01-26 DIAGNOSIS — M6249 Contracture of muscle, multiple sites: Secondary | ICD-10-CM | POA: Diagnosis not present

## 2016-01-26 DIAGNOSIS — M5417 Radiculopathy, lumbosacral region: Secondary | ICD-10-CM | POA: Diagnosis not present

## 2016-01-26 DIAGNOSIS — M545 Low back pain: Secondary | ICD-10-CM | POA: Diagnosis not present

## 2016-01-29 ENCOUNTER — Other Ambulatory Visit: Payer: Self-pay | Admitting: Internal Medicine

## 2016-03-01 DIAGNOSIS — R1012 Left upper quadrant pain: Secondary | ICD-10-CM | POA: Diagnosis not present

## 2016-03-01 DIAGNOSIS — K219 Gastro-esophageal reflux disease without esophagitis: Secondary | ICD-10-CM | POA: Diagnosis not present

## 2016-05-10 ENCOUNTER — Emergency Department (HOSPITAL_COMMUNITY)
Admission: EM | Admit: 2016-05-10 | Discharge: 2016-05-10 | Disposition: A | Discharge status: Home / Self Care | Payer: Medicare HMO | Attending: Emergency Medicine | Admitting: Emergency Medicine

## 2016-05-10 ENCOUNTER — Emergency Department (HOSPITAL_COMMUNITY): Payer: Medicare HMO

## 2016-05-10 ENCOUNTER — Encounter (HOSPITAL_COMMUNITY): Payer: Self-pay | Admitting: Emergency Medicine

## 2016-05-10 DIAGNOSIS — R079 Chest pain, unspecified: Secondary | ICD-10-CM | POA: Diagnosis present

## 2016-05-10 DIAGNOSIS — R1013 Epigastric pain: Secondary | ICD-10-CM | POA: Diagnosis not present

## 2016-05-10 DIAGNOSIS — I10 Essential (primary) hypertension: Secondary | ICD-10-CM | POA: Diagnosis not present

## 2016-05-10 DIAGNOSIS — Z79899 Other long term (current) drug therapy: Secondary | ICD-10-CM | POA: Insufficient documentation

## 2016-05-10 DIAGNOSIS — F329 Major depressive disorder, single episode, unspecified: Secondary | ICD-10-CM | POA: Insufficient documentation

## 2016-05-10 DIAGNOSIS — E119 Type 2 diabetes mellitus without complications: Secondary | ICD-10-CM | POA: Diagnosis not present

## 2016-05-10 DIAGNOSIS — R69 Illness, unspecified: Secondary | ICD-10-CM | POA: Diagnosis not present

## 2016-05-10 DIAGNOSIS — Z7984 Long term (current) use of oral hypoglycemic drugs: Secondary | ICD-10-CM | POA: Diagnosis not present

## 2016-05-10 DIAGNOSIS — Z7982 Long term (current) use of aspirin: Secondary | ICD-10-CM | POA: Diagnosis not present

## 2016-05-10 DIAGNOSIS — R0789 Other chest pain: Secondary | ICD-10-CM | POA: Diagnosis not present

## 2016-05-10 LAB — URINALYSIS, ROUTINE W REFLEX MICROSCOPIC
BILIRUBIN URINE: NEGATIVE
GLUCOSE, UA: NEGATIVE mg/dL
HGB URINE DIPSTICK: NEGATIVE
KETONES UR: NEGATIVE mg/dL
Leukocytes, UA: NEGATIVE
NITRITE: NEGATIVE
PH: 5.5 (ref 5.0–8.0)
Protein, ur: NEGATIVE mg/dL
Specific Gravity, Urine: 1.027 (ref 1.005–1.030)

## 2016-05-10 LAB — CBC WITH DIFFERENTIAL/PLATELET
BASOS PCT: 1 %
Basophils Absolute: 0.2 10*3/uL — ABNORMAL HIGH (ref 0.0–0.1)
Eosinophils Absolute: 0.8 10*3/uL — ABNORMAL HIGH (ref 0.0–0.7)
Eosinophils Relative: 6 %
HEMATOCRIT: 39.5 % (ref 36.0–46.0)
Hemoglobin: 13 g/dL (ref 12.0–15.0)
Lymphocytes Relative: 25 %
Lymphs Abs: 3.1 10*3/uL (ref 0.7–4.0)
MCH: 27.1 pg (ref 26.0–34.0)
MCHC: 32.9 g/dL (ref 30.0–36.0)
MCV: 82.3 fL (ref 78.0–100.0)
MONO ABS: 1.3 10*3/uL — AB (ref 0.1–1.0)
MONOS PCT: 11 %
NEUTROS ABS: 6.8 10*3/uL (ref 1.7–7.7)
Neutrophils Relative %: 57 %
Platelets: 508 10*3/uL — ABNORMAL HIGH (ref 150–400)
RBC: 4.8 MIL/uL (ref 3.87–5.11)
RDW: 14.3 % (ref 11.5–15.5)
WBC: 12.1 10*3/uL — ABNORMAL HIGH (ref 4.0–10.5)

## 2016-05-10 LAB — COMPREHENSIVE METABOLIC PANEL
ALT: 26 U/L (ref 14–54)
AST: 44 U/L — AB (ref 15–41)
Albumin: 3.8 g/dL (ref 3.5–5.0)
Alkaline Phosphatase: 114 U/L (ref 38–126)
Anion gap: 8 (ref 5–15)
BILIRUBIN TOTAL: 0.6 mg/dL (ref 0.3–1.2)
BUN: 15 mg/dL (ref 6–20)
CHLORIDE: 101 mmol/L (ref 101–111)
CO2: 27 mmol/L (ref 22–32)
CREATININE: 0.76 mg/dL (ref 0.44–1.00)
Calcium: 9.1 mg/dL (ref 8.9–10.3)
GFR calc Af Amer: >60 mL/min (ref >60)
Glucose, Bld: 127 mg/dL — ABNORMAL HIGH (ref 65–99)
Potassium: 3.6 mmol/L (ref 3.5–5.1)
Sodium: 136 mmol/L (ref 135–145)
TOTAL PROTEIN: 8 g/dL (ref 6.5–8.1)

## 2016-05-10 LAB — TROPONIN I
Troponin I: <0.03 ng/mL (ref <0.031)
Troponin I: <0.03 ng/mL (ref <0.031)

## 2016-05-10 LAB — LIPASE, BLOOD: LIPASE: 24 U/L (ref 11–51)

## 2016-05-10 MED ORDER — IOPAMIDOL (ISOVUE-300) INJECTION 61%
100.0000 mL | Freq: Once | INTRAVENOUS | Status: AC | PRN
  Administered 2016-05-10: 100 mL via INTRAVENOUS

## 2016-05-10 MED ORDER — ONDANSETRON HCL 4 MG/2ML IJ SOLN
INTRAMUSCULAR | Status: DC
Start: 2016-05-10 — End: 2016-05-10
  Filled 2016-05-10: qty 2

## 2016-05-10 MED ORDER — PANTOPRAZOLE SODIUM 20 MG PO TBEC: 20.0000 mg | DELAYED_RELEASE_TABLET | Freq: Every day | ORAL | Status: DC

## 2016-05-10 MED ORDER — ONDANSETRON HCL 4 MG/2ML IJ SOLN
4.0000 mg | Freq: Once | INTRAMUSCULAR | Status: AC
  Administered 2016-05-10: 4 mg via INTRAVENOUS

## 2016-05-10 MED ORDER — SODIUM CHLORIDE 0.9 % IV SOLN
INTRAVENOUS | Status: DC
  Administered 2016-05-10: 12:00:00 via INTRAVENOUS

## 2016-05-10 MED ORDER — PANTOPRAZOLE SODIUM 40 MG PO TBEC
40.0000 mg | DELAYED_RELEASE_TABLET | Freq: Once | ORAL | Status: AC
  Administered 2016-05-10: 40 mg via ORAL
  Filled 2016-05-10: qty 1

## 2016-05-10 MED ORDER — MORPHINE SULFATE (PF) 4 MG/ML IV SOLN
INTRAVENOUS | Status: AC
  Filled 2016-05-10: qty 1

## 2016-05-10 MED ORDER — FAMOTIDINE IN NACL 20-0.9 MG/50ML-% IV SOLN
20.0000 mg | Freq: Once | INTRAVENOUS | Status: AC
  Administered 2016-05-10: 20 mg via INTRAVENOUS
  Filled 2016-05-10: qty 50

## 2016-05-10 MED ORDER — HYDROCODONE-ACETAMINOPHEN 5-325 MG PO TABS
2.0000 | ORAL_TABLET | Freq: Once | ORAL | Status: AC
  Administered 2016-05-10: 2 via ORAL
  Filled 2016-05-10: qty 2

## 2016-05-10 MED ORDER — MORPHINE SULFATE (PF) 4 MG/ML IV SOLN
4.0000 mg | Freq: Once | INTRAVENOUS | Status: AC
  Administered 2016-05-10: 4 mg via INTRAVENOUS

## 2016-05-10 MED ORDER — GI COCKTAIL ~~LOC~~
30.0000 mL | Freq: Once | ORAL | Status: AC
  Administered 2016-05-10: 30 mL via ORAL
  Filled 2016-05-10: qty 30

## 2016-05-10 NOTE — ED Provider Notes (Signed)
CSN: [id number]     Arrival date & time 05/10/16  0818 History   First MD Initiated Contact with Patient 05/10/16 0820     Chief Complaint  Patient presents with  . Chest Pain     (Consider location/radiation/quality/duration/timing/severity/associated sxs/prior Treatment) HPI Patient reports that she had a sudden onset of epigastric and lower chest pain this morning. It started about 30 minutes prior to arrival to the emergency department. She reports it is severe and localized. It is made worse by movements and deep breath. No recent fevers or chills. No recent cough, vomiting or diarrhea. The patient does report she is scheduled to get an upper endoscopy for a possible mass that might be cancerous. Past Medical History  Diagnosis Date  . Chronic back pain   . Depression   . Hypertension   . Diabetes mellitus without complication ([place])   . Chronic combined systolic and diastolic CHF (congestive heart failure) ([place])     a. Echo (08/02/13):  EF 45-50%, diff HK, Gr 1 DD, MAC, mild LAE, normal RVSF, PASP 36;  b. CM likely related to untreated HTN   Past Surgical History  Procedure Laterality Date  . Abdominal hysterectomy  11/2011  . Cholecystectomy  1988  . Splenectomy, total  1988  . Gastric bypass     History reviewed. No pertinent family history. Social History  Substance Use Topics  . Smoking status: Never Smoker   . Smokeless tobacco: Never Used  . Alcohol Use: No   OB History    No data available     Review of Systems 10 Systems reviewed and are negative for acute change except as noted in the HPI.    Allergies  Nsaids  Home Medications   Prior to Admission medications   Medication Sig Start Date End Date Taking? Authorizing Provider  Alcohol Swabs PADS 100 11/26/14  Yes Tresa Garter, MD  Ascorbic Acid (VITAMIN C) 1000 MG tablet Take 1,000 mg by mouth daily.   Yes Historical Provider, MD  aspirin EC 81 MG tablet Take 1 tablet (81 mg total) by mouth daily.  11/26/14  Yes Tresa Garter, MD  carvedilol (COREG) 3.125 MG tablet Take 1 tablet (3.125 mg total) by mouth 2 (two) times daily with a meal. Must have office visit for refills 02/02/16  Yes Tresa Garter, MD  cyclobenzaprine (FLEXERIL) 10 MG tablet Take 1 tablet (10 mg total) by mouth 3 (three) times daily as needed for muscle spasms. 11/26/14  Yes Tresa Garter, MD  furosemide (LASIX) 40 MG tablet Take 1 tablet (40 mg total) by mouth daily. 11/26/14  Yes Tresa Garter, MD  glucose blood test strip Use as instructed 11/26/14  Yes Olugbemiga E Doreene Burke, MD  glucose monitoring kit (FREESTYLE) monitoring kit 1 each by Does not apply route as needed for other. Please check cbg's twice a day. 11/26/14  Yes Tresa Garter, MD  Lancets (FREESTYLE) lancets Use as instructed 11/26/14  Yes Tresa Garter, MD  lisinopril (PRINIVIL,ZESTRIL) 2.5 MG tablet Take 1 tablet (2.5 mg total) by mouth daily. Must have office visit for refills 02/02/16  Yes Tresa Garter, MD  lovastatin (MEVACOR) 20 MG tablet Take 1 tablet (20 mg total) by mouth daily with breakfast. Must have office visit for refills 01/23/16  Yes Tresa Garter, MD  metFORMIN (GLUCOPHAGE) 500 MG tablet Take 1 tablet (500 mg total) by mouth 2 (two) times daily with a meal. Must have office visit for refills  01/23/16  Yes Tresa Garter, MD  naproxen sodium (ANAPROX) 220 MG tablet Take 440 mg by mouth every 4 (four) hours as needed (for pain).   Yes Historical Provider, MD  Sennosides (EX-LAX PO) Take 1-2 tablets by mouth daily as needed (for constipation).   Yes Historical Provider, MD  traMADol (ULTRAM) 50 MG tablet Take 50 mg by mouth every 6 (six) hours as needed for moderate pain.   Yes Historical Provider, MD  nortriptyline (PAMELOR) 25 MG capsule Take 1-3 capsules (25-75 mg total) by mouth at bedtime. Patient not taking: Reported on 05/10/2016 11/26/14   Tresa Garter, MD  NP THYROID 15 MG tablet Take 15  mg by mouth daily. 02/12/16   Historical Provider, MD  pantoprazole (PROTONIX) 20 MG tablet Take 1 tablet (20 mg total) by mouth daily. 05/10/16   Charlesetta Shanks, MD  polyethylene glycol (MIRALAX / GLYCOLAX) packet Take 17 g by mouth daily. Patient not taking: Reported on 11/26/2014 08/04/13   Hosie Poisson, MD   BP 111/54 mmHg  Pulse 72  Resp 17  SpO2 96% Physical Exam  Constitutional: She is oriented to person, place, and time.  Patient is morbidly obese. She is in moderate to severe pain. Mental status is clear. Skin is warm and dry. No respiratory distress.  HENT:  Head: Normocephalic and atraumatic.  Mouth/Throat: Oropharynx is clear and moist.  Eyes: EOM are normal.  Cardiovascular: Normal rate, regular rhythm, normal heart sounds and intact distal pulses.   Pulmonary/Chest: Effort normal and breath sounds normal.  Abdominal: Soft. She exhibits no distension. There is tenderness.  Patient endorses severe tenderness to palpation in the high epigastrium. Lower abdomen is nontender.  Musculoskeletal: Normal range of motion. She exhibits no edema or tenderness.  Neurological: She is alert and oriented to person, place, and time. She exhibits normal muscle tone. Coordination normal.  Skin: Skin is warm and dry.  Psychiatric:  Patient is anxious.    ED Course  Procedures (including critical care time) Labs Review Labs Reviewed  COMPREHENSIVE METABOLIC PANEL - Abnormal; Notable for the following:    Glucose, Bld 127 (*)    AST 44 (*)    All other components within normal limits  CBC WITH DIFFERENTIAL/PLATELET - Abnormal; Notable for the following:    WBC 12.1 (*)    Platelets 508 (*)    Monocytes Absolute 1.3 (*)    Eosinophils Absolute 0.8 (*)    Basophils Absolute 0.2 (*)    All other components within normal limits  URINALYSIS, ROUTINE W REFLEX MICROSCOPIC (NOT AT [facility]) - Abnormal; Notable for the following:    APPearance CLOUDY (*)    All other components within normal  limits  LIPASE, BLOOD  TROPONIN I    Imaging Review Dg Chest 2 View  05/10/2016  CLINICAL DATA:  Mid chest pressure beginning at 7 a.m. today. EXAM: CHEST  2 VIEW COMPARISON:  CT chest and PA and lateral chest 08/01/2013. FINDINGS: The lungs are clear. Heart size is normal. No pneumothorax or pleural effusion. No focal bony abnormality. IMPRESSION: No acute disease. Electronically Signed   By: Inge Rise M.D.   On: 05/10/2016 10:12   Ct Chest W Contrast  05/10/2016  CLINICAL DATA:  Epigastric chest pressure and tightness which is worse with deep breath EXAM: CT CHEST, ABDOMEN, AND PELVIS WITH CONTRAST TECHNIQUE: Multidetector CT imaging of the chest, abdomen and pelvis was performed following the standard protocol during bolus administration of intravenous contrast. CONTRAST:  146m ISOVUE-300  IOPAMIDOL (ISOVUE-300) INJECTION 61% COMPARISON:  CT abdomen and pelvis from 01/16/2016. CT chest from 08/01/2013 FINDINGS: CT CHEST FINDINGS Mediastinum/Lymph Nodes: There is no axillary lymphadenopathy. No mediastinal lymphadenopathy. There is no hilar lymphadenopathy. The heart size is normal. No pericardial effusion. The esophagus has normal imaging features. Lungs/Pleura: No focal airspace consolidation. No pulmonary edema or pleural effusion. Subsegmental atelectasis noted in the lingula. There is compressive atelectasis in posterior lung bases bilaterally. Musculoskeletal: Bone windows reveal no worrisome lytic or sclerotic osseous lesions. CT ABDOMEN PELVIS FINDINGS Hepatobiliary: The liver shows diffusely decreased attenuation suggesting steatosis. Liver measures 20.9 cm in craniocaudal length. Gallbladder surgically absent. No intrahepatic or extrahepatic biliary dilation. Pancreas: No focal mass lesion. No dilatation of the main duct. No intraparenchymal cyst. No peripancreatic edema. Spleen: Spleen is surgically absent. Adrenals/Urinary Tract: No adrenal nodule or mass. No enhancing lesion in either  kidney. No hydronephrosis. No evidence for hydroureter. The urinary bladder appears normal for the degree of distention. Stomach/Bowel: patient is status post gastric bypass. Duodenum is normally positioned as is the ligament of Treitz. No small bowel wall thickening. No small bowel dilatation. The terminal ileum is normal. The appendix is not visualized, but there is no edema or inflammation in the region of the cecum. No gross colonic mass. No colonic wall thickening. No substantial diverticular change. Vascular/Lymphatic: No abdominal aortic aneurysm. No abdominal aortic atherosclerotic calcification. There is no gastrohepatic or hepatoduodenal ligament lymphadenopathy. No intraperitoneal or retroperitoneal lymphadenopathy. No pelvic sidewall lymphadenopathy. Reproductive: Uterus is surgically absent. There is no adnexal mass. Other: No intraperitoneal free fluid. Musculoskeletal: Degenerative disc disease is noted in the upper lumbar spine at the lumbosacral junction. IMPRESSION: 1. No findings to explain the patient's history of epigastric pressure and tightness. No acute findings in the chest, abdomen, or pelvis. 2. Hepatomegaly with steatosis. 3. Postsurgical change in the stomach. 4. Status post hysterectomy and cholecystectomy. Electronically Signed   By: Misty Stanley M.D.   On: 05/10/2016 12:46   Ct Abdomen Pelvis W Contrast  05/10/2016  CLINICAL DATA:  Epigastric chest pressure and tightness which is worse with deep breath EXAM: CT CHEST, ABDOMEN, AND PELVIS WITH CONTRAST TECHNIQUE: Multidetector CT imaging of the chest, abdomen and pelvis was performed following the standard protocol during bolus administration of intravenous contrast. CONTRAST:  152m ISOVUE-300 IOPAMIDOL (ISOVUE-300) INJECTION 61% COMPARISON:  CT abdomen and pelvis from 01/16/2016. CT chest from 08/01/2013 FINDINGS: CT CHEST FINDINGS Mediastinum/Lymph Nodes: There is no axillary lymphadenopathy. No mediastinal lymphadenopathy.  There is no hilar lymphadenopathy. The heart size is normal. No pericardial effusion. The esophagus has normal imaging features. Lungs/Pleura: No focal airspace consolidation. No pulmonary edema or pleural effusion. Subsegmental atelectasis noted in the lingula. There is compressive atelectasis in posterior lung bases bilaterally. Musculoskeletal: Bone windows reveal no worrisome lytic or sclerotic osseous lesions. CT ABDOMEN PELVIS FINDINGS Hepatobiliary: The liver shows diffusely decreased attenuation suggesting steatosis. Liver measures 20.9 cm in craniocaudal length. Gallbladder surgically absent. No intrahepatic or extrahepatic biliary dilation. Pancreas: No focal mass lesion. No dilatation of the main duct. No intraparenchymal cyst. No peripancreatic edema. Spleen: Spleen is surgically absent. Adrenals/Urinary Tract: No adrenal nodule or mass. No enhancing lesion in either kidney. No hydronephrosis. No evidence for hydroureter. The urinary bladder appears normal for the degree of distention. Stomach/Bowel: patient is status post gastric bypass. Duodenum is normally positioned as is the ligament of Treitz. No small bowel wall thickening. No small bowel dilatation. The terminal ileum is normal. The appendix is not visualized, but  there is no edema or inflammation in the region of the cecum. No gross colonic mass. No colonic wall thickening. No substantial diverticular change. Vascular/Lymphatic: No abdominal aortic aneurysm. No abdominal aortic atherosclerotic calcification. There is no gastrohepatic or hepatoduodenal ligament lymphadenopathy. No intraperitoneal or retroperitoneal lymphadenopathy. No pelvic sidewall lymphadenopathy. Reproductive: Uterus is surgically absent. There is no adnexal mass. Other: No intraperitoneal free fluid. Musculoskeletal: Degenerative disc disease is noted in the upper lumbar spine at the lumbosacral junction. IMPRESSION: 1. No findings to explain the patient's history of  epigastric pressure and tightness. No acute findings in the chest, abdomen, or pelvis. 2. Hepatomegaly with steatosis. 3. Postsurgical change in the stomach. 4. Status post hysterectomy and cholecystectomy. Electronically Signed   By: Misty Stanley M.D.   On: 05/10/2016 12:46   I have personally reviewed and evaluated these images and lab results as part of my medical decision-making.   EKG Interpretation   Date/Time:  Monday May 10 2016 08:25:54 EDT Ventricular Rate:  80 PR Interval:  158 QRS Duration: 91 QT Interval:  446 QTC Calculation: 514 R Axis:   18 Text Interpretation:  Sinus rhythm Low voltage, precordial leads Confirmed  by Johnney Killian, MD, Jeannie Done [phone]) on 05/10/2016 9:26:22 AM      MDM   Final diagnoses:  Epigastric pain   Patient had acute and severe epigastric and lower chest pain. Pain was reproducible on palpation. At this time I most suspect GI etiology such as esophageal spasm. CT chest and abdomen do not show acute pathology. Patient's EKG does not show ischemic changes and pain was atypical for cardiac etiology. Patient has scheduled follow-up with gastroenterology for a upper endoscopy. At this time I will advise she take Protonix daily. The patient be counseled on signs and symptoms for which to return.    Charlesetta Shanks, MD 05/10/16 1401

## 2016-05-10 NOTE — Discharge Instructions (Signed)
Suspected Esophageal Spasm An esophageal spasm is a muscle spasm of the tube that connects the back of your throat to your stomach (esophagus). Your esophagus normally moves food and liquids down into your stomach with smooth, wavelike muscle contractions. If you have esophageal spasms, abnormal muscle contractions in your esophagus can be painful and cause you to have difficulty swallowing (dysphagia). There are two types of esophageal spasms. You may have one or both types:  Diffuse esophageal spasms. These are irregular, uncoordinated muscle movements of the esophagus. The diffuse type causes more dysphagia.  Nutcracker esophagus. This is a type of muscle contraction that is coordinated but too strong. The muscles move in a regular order, but the contraction is stronger than necessary. The nutcracker type of esophageal spasm is more painful. Severe cases of esophageal spasm can make it hard to eat well and do all your usual activities. Esophageal spasms often occur along with severe heartburn (reflux esophagitis). Swallowed foods or liquids may also come back up into your throat (regurgitation).  CAUSES  The cause of esophageal spasm is not known. RISK FACTORS You may have a higher risk for esophageal spasm if you:  Are female.  Are an older person.  Eat very quickly.  Swallow foods or drinks that are very hot or very cold.  Are depressed or anxious. SIGNS AND SYMPTOMS  Signs and symptoms can vary from day to day. They may be mild or severe. They may last for minutes or hours. Common signs and symptoms include:  Chest pain. This may feel like a heart attack.  Back pain.  Dysphagia.  Heartburn.  The feeling that something is stuck in your throat (globus).  Regurgitation of foods or liquids. DIAGNOSIS  Your health care provider may suspect esophageal spasm based on your symptoms and a physical exam. Tests may be done to help confirm the diagnosis. These may  include:  Endoscopy. A flexible telescope is passed into your esophagus.  Barium swallow. An X-ray is done while you drink a substance (contrast material) that shows up on X-ray.  Esophageal manometry. Pressure measurements of the inside of your esophagus are taken while you swallow. TREATMENT  Mild cases of esophageal spasms may not need to be treated. You may be able to manage the spasms by avoiding the foods and eating habits that trigger them. Treatment for spasms that are more severe or frequent can include the following:  You may be given medicine to:  Relax the esophageal muscles.  Relieve muscle spasms (calcium channel blockers and nitrates).  Block nerve endings in the esophagus. This is done with an injection of a certain toxin (botulinum).  Relieve heartburn (proton pump inhibitors).  Antidepressant medicines are sometimes used to ease symptoms.  For severe cases, surgery is sometimes done to reduce esophageal muscle contractions (myotomy). HOME CARE INSTRUCTIONS   Take medicines only as directed by your health care provider.  Do not eat foods that trigger heartburn or esophageal spasms.  Eat your meals slowly.  Chew your food completely.  Avoid foods and drinks that are very hot or very cold.  Find ways to manage stress.  Ask for help if you struggle with depression or anxiety.  Keep all follow-up visits as directed by your health care provider. This is important. SEEK MEDICAL CARE IF:   Your symptoms change or get worse.  You are losing weight because of dysphagia.  Your medicine is not helping your symptoms.  Your esophageal spasms are interfering with your quality of life.  SEEK IMMEDIATE MEDICAL CARE IF:   You have very bad or unusual chest pain.  You have trouble breathing.  You choke. MAKE SURE YOU:  Understand these instructions.  Will watch your condition.  Will get help right away if you are not doing well or get worse.   This  information is not intended to replace advice given to you by your health care provider. Make sure you discuss any questions you have with your health care provider.   Document Released: 02/05/2003 Document Revised: 12/06/2014 Document Reviewed: 02/08/2014 Elsevier Interactive Patient Education 2016 [employer] Nonspecific Chest Pain  Chest pain can be caused by many different conditions. There is always a chance that your pain could be related to something serious, such as a heart attack or a blood clot in your lungs. Chest pain can also be caused by conditions that are not life-threatening. If you have chest pain, it is very important to follow up with your health care provider. CAUSES  Chest pain can be caused by:  Heartburn.  Pneumonia or bronchitis.  Anxiety or stress.  Inflammation around your heart (pericarditis) or lung (pleuritis or pleurisy).  A blood clot in your lung.  A collapsed lung (pneumothorax). It can develop suddenly on its own (spontaneous pneumothorax) or from trauma to the chest.  Shingles infection (varicella-zoster virus).  Heart attack.  Damage to the bones, muscles, and cartilage that make up your chest wall. This can include:  Bruised bones due to injury.  Strained muscles or cartilage due to frequent or repeated coughing or overwork.  Fracture to one or more ribs.  Sore cartilage due to inflammation (costochondritis). RISK FACTORS  Risk factors for chest pain may include:  Activities that increase your risk for trauma or injury to your chest.  Respiratory infections or conditions that cause frequent coughing.  Medical conditions or overeating that can cause heartburn.  Heart disease or family history of heart disease.  Conditions or health behaviors that increase your risk of developing a blood clot.  Having had chicken pox (varicella zoster). SIGNS AND SYMPTOMS Chest pain can feel like:  Burning or tingling on the surface of your  chest or deep in your chest.  Crushing, pressure, aching, or squeezing pain.  Dull or sharp pain that is worse when you move, cough, or take a deep breath.  Pain that is also felt in your back, neck, shoulder, or arm, or pain that spreads to any of these areas. Your chest pain may come and go, or it may stay constant. DIAGNOSIS Lab tests or other studies may be needed to find the cause of your pain. Your health care provider may have you take a test called an ambulatory ECG (electrocardiogram). An ECG records your heartbeat patterns at the time the test is performed. You may also have other tests, such as:  Transthoracic echocardiogram (TTE). During echocardiography, sound waves are used to create a picture of all of the heart structures and to look at how blood flows through your heart.  Transesophageal echocardiogram (TEE).This is a more advanced imaging test that obtains images from inside your body. It allows your health care provider to see your heart in finer detail.  Cardiac monitoring. This allows your health care provider to monitor your heart rate and rhythm in real time.  Holter monitor. This is a portable device that records your heartbeat and can help to diagnose abnormal heartbeats. It allows your health care provider to track your heart activity for several days, if  needed.  Stress tests. These can be done through exercise or by taking medicine that makes your heart beat more quickly.  Blood tests.  Imaging tests. TREATMENT  Your treatment depends on what is causing your chest pain. Treatment may include:  Medicines. These may include:  Acid blockers for heartburn.  Anti-inflammatory medicine.  Pain medicine for inflammatory conditions.  Antibiotic medicine, if an infection is present.  Medicines to dissolve blood clots.  Medicines to treat coronary artery disease.  Supportive care for conditions that do not require medicines. This may  include:  Resting.  Applying heat or cold packs to injured areas.  Limiting activities until pain decreases. HOME CARE INSTRUCTIONS  If you were prescribed an antibiotic medicine, finish it all even if you start to feel better.  Avoid any activities that bring on chest pain.  Do not use any tobacco products, including cigarettes, chewing tobacco, or electronic cigarettes. If you need help quitting, ask your health care provider.  Do not drink alcohol.  Take medicines only as directed by your health care provider.  Keep all follow-up visits as directed by your health care provider. This is important. This includes any further testing if your chest pain does not go away.  If heartburn is the cause for your chest pain, you may be told to keep your head raised (elevated) while sleeping. This reduces the chance that acid will go from your stomach into your esophagus.  Make lifestyle changes as directed by your health care provider. These may include:  Getting regular exercise. Ask your health care provider to suggest some activities that are safe for you.  Eating a heart-healthy diet. A registered dietitian can help you to learn healthy eating options.  Maintaining a healthy weight.  Managing diabetes, if necessary.  Reducing stress. SEEK MEDICAL CARE IF:  Your chest pain does not go away after treatment.  You have a rash with blisters on your chest.  You have a fever. SEEK IMMEDIATE MEDICAL CARE IF:   Your chest pain is worse.  You have an increasing cough, or you cough up blood.  You have severe abdominal pain.  You have severe weakness.  You faint.  You have chills.  You have sudden, unexplained chest discomfort.  You have sudden, unexplained discomfort in your arms, back, neck, or jaw.  You have shortness of breath at any time.  You suddenly start to sweat, or your skin gets clammy.  You feel nauseous or you vomit.  You suddenly feel light-headed or  dizzy.  Your heart begins to beat quickly, or it feels like it is skipping beats. These symptoms may represent a serious problem that is an emergency. Do not wait to see if the symptoms will go away. Get medical help right away. Call your local emergency services (911 in the [place] Do not drive yourself to the hospital.   This information is not intended to replace advice given to you by your health care provider. Make sure you discuss any questions you have with your health care provider.   Document Released: 08/25/2005 Document Revised: 12/06/2014 Document Reviewed: 06/21/2014 Elsevier Interactive Patient Education [employer].

## 2016-05-10 NOTE — ED Notes (Signed)
MD at bedside. 

## 2016-05-10 NOTE — ED Notes (Signed)
Pt c/o epigastric chest pressure and tightness worsened by deep breath, breathing, or palpation. Last BM this morning.

## 2016-05-20 ENCOUNTER — Other Ambulatory Visit: Payer: Self-pay | Admitting: Gastroenterology

## 2016-05-24 ENCOUNTER — Encounter (HOSPITAL_COMMUNITY): Payer: Self-pay | Admitting: *Deleted

## 2016-05-27 ENCOUNTER — Ambulatory Visit (HOSPITAL_COMMUNITY)
Admission: RE | Admit: 2016-05-27 | Discharge: 2016-05-27 | Disposition: A | Discharge status: Home / Self Care | Payer: Medicare HMO | Source: Ambulatory Visit | Attending: Gastroenterology | Admitting: Gastroenterology

## 2016-05-27 ENCOUNTER — Encounter (HOSPITAL_COMMUNITY): Payer: Self-pay

## 2016-05-27 ENCOUNTER — Ambulatory Visit (HOSPITAL_COMMUNITY): Payer: Medicare HMO | Admitting: Anesthesiology

## 2016-05-27 ENCOUNTER — Encounter (HOSPITAL_COMMUNITY)
Admission: RE | Disposition: A | Discharge status: Home / Self Care | Payer: Self-pay | Source: Ambulatory Visit | Attending: Gastroenterology

## 2016-05-27 DIAGNOSIS — Z9884 Bariatric surgery status: Secondary | ICD-10-CM | POA: Diagnosis not present

## 2016-05-27 DIAGNOSIS — Z6841 Body Mass Index (BMI) 40.0 and over, adult: Secondary | ICD-10-CM | POA: Insufficient documentation

## 2016-05-27 DIAGNOSIS — E119 Type 2 diabetes mellitus without complications: Secondary | ICD-10-CM | POA: Diagnosis not present

## 2016-05-27 DIAGNOSIS — Z7984 Long term (current) use of oral hypoglycemic drugs: Secondary | ICD-10-CM | POA: Diagnosis not present

## 2016-05-27 DIAGNOSIS — D123 Benign neoplasm of transverse colon: Secondary | ICD-10-CM | POA: Diagnosis not present

## 2016-05-27 DIAGNOSIS — K219 Gastro-esophageal reflux disease without esophagitis: Secondary | ICD-10-CM | POA: Diagnosis not present

## 2016-05-27 DIAGNOSIS — Z7982 Long term (current) use of aspirin: Secondary | ICD-10-CM | POA: Insufficient documentation

## 2016-05-27 DIAGNOSIS — I10 Essential (primary) hypertension: Secondary | ICD-10-CM | POA: Diagnosis not present

## 2016-05-27 DIAGNOSIS — R1012 Left upper quadrant pain: Secondary | ICD-10-CM | POA: Diagnosis not present

## 2016-05-27 DIAGNOSIS — K648 Other hemorrhoids: Secondary | ICD-10-CM | POA: Insufficient documentation

## 2016-05-27 DIAGNOSIS — D125 Benign neoplasm of sigmoid colon: Secondary | ICD-10-CM | POA: Insufficient documentation

## 2016-05-27 DIAGNOSIS — Z79899 Other long term (current) drug therapy: Secondary | ICD-10-CM | POA: Diagnosis not present

## 2016-05-27 DIAGNOSIS — D120 Benign neoplasm of cecum: Secondary | ICD-10-CM | POA: Diagnosis not present

## 2016-05-27 HISTORY — PX: ESOPHAGOGASTRODUODENOSCOPY (EGD) WITH PROPOFOL: SHX5813

## 2016-05-27 HISTORY — PX: COLONOSCOPY WITH PROPOFOL: SHX5780

## 2016-05-27 LAB — GLUCOSE, CAPILLARY: Glucose-Capillary: 134 mg/dL — ABNORMAL HIGH (ref 65–99)

## 2016-05-27 SURGERY — ESOPHAGOGASTRODUODENOSCOPY (EGD) WITH PROPOFOL
Anesthesia: Monitor Anesthesia Care

## 2016-05-27 MED ORDER — SODIUM CHLORIDE 0.9 % IV SOLN: INTRAVENOUS | Status: DC

## 2016-05-27 MED ORDER — PROPOFOL 500 MG/50ML IV EMUL
INTRAVENOUS | Status: DC | PRN
  Administered 2016-05-27 (×3): 10 mg via INTRAVENOUS
  Administered 2016-05-27 (×2): 20 mg via INTRAVENOUS
  Administered 2016-05-27: 50 mg via INTRAVENOUS

## 2016-05-27 MED ORDER — PROPOFOL 10 MG/ML IV BOLUS
INTRAVENOUS | Status: AC
Start: 2016-05-27 — End: 2016-05-27
  Filled 2016-05-27: qty 40

## 2016-05-27 MED ORDER — PROPOFOL 10 MG/ML IV BOLUS
INTRAVENOUS | Status: AC
Start: 2016-05-27 — End: 2016-05-27
  Filled 2016-05-27: qty 20

## 2016-05-27 MED ORDER — PROPOFOL 10 MG/ML IV BOLUS
INTRAVENOUS | Status: AC
  Filled 2016-05-27: qty 20

## 2016-05-27 MED ORDER — PROPOFOL 500 MG/50ML IV EMUL
INTRAVENOUS | Status: DC | PRN
  Administered 2016-05-27: 100 ug/kg/min via INTRAVENOUS

## 2016-05-27 MED ORDER — LACTATED RINGERS IV SOLN
INTRAVENOUS | Status: DC
  Administered 2016-05-27: 1000 mL via INTRAVENOUS

## 2016-05-27 MED ORDER — LIDOCAINE HCL (CARDIAC) 20 MG/ML IV SOLN
INTRAVENOUS | Status: AC
Start: 2016-05-27 — End: 2016-05-27
  Filled 2016-05-27: qty 5

## 2016-05-27 SURGICAL SUPPLY — 24 items

## 2016-05-27 NOTE — Anesthesia Preprocedure Evaluation (Addendum)
Anesthesia Evaluation  Patient identified by MRN, date of birth, ID band Patient awake    Reviewed: Allergy & Precautions, NPO status , Patient's Chart, lab work & pertinent test results, reviewed documented beta blocker date and time   History of Anesthesia Complications Negative for: history of anesthetic complications  Airway Mallampati: II  TM Distance: >3 FB Neck ROM: Full    Dental  (+) Teeth Intact, Dental Advisory Given   Pulmonary neg pulmonary ROS,    Pulmonary exam normal        Cardiovascular hypertension, Pt. on medications Normal cardiovascular exam  Echo (08/02/13): EF 45-50%, diff HK, Gr 1 DD, MAC, mild LAE, normal RVSF, PASP 36; b. CM likely related to untreated HTN   Neuro/Psych PSYCHIATRIC DISORDERS Depression negative neurological ROS     GI/Hepatic negative GI ROS, Neg liver ROS,   Endo/Other  diabetesMorbid obesity  Renal/GU negative Renal ROS     Musculoskeletal   Abdominal   Peds  Hematology   Anesthesia Other Findings   Reproductive/Obstetrics                           Anesthesia Physical Anesthesia Plan  ASA: III  Anesthesia Plan: MAC   Post-op Pain Management:    Induction:   Airway Management Planned: Natural Airway  Additional Equipment:   Intra-op Plan:   Post-operative Plan:   Informed Consent: I have reviewed the patients History and Physical, chart, labs and discussed the procedure including the risks, benefits and alternatives for the proposed anesthesia with the patient or authorized representative who has indicated his/her understanding and acceptance.   Dental advisory given  Plan Discussed with: Anesthesiologist and CRNA  Anesthesia Plan Comments: (No beta blocker for 1 month)       Anesthesia Quick Evaluation

## 2016-05-27 NOTE — H&P (Signed)
  Date of Initial H&P: 05/13/16  History reviewed, patient examined, no change in status, stable for surgery.

## 2016-05-27 NOTE — Discharge Instructions (Addendum)
Will call you when pathology results are complete.  Esophagogastroduodenoscopy, Care After Refer to this sheet in the next few weeks. These instructions provide you with information about caring for yourself after your procedure. Your health care provider may also give you more specific instructions. Your treatment has been planned according to current medical practices, but problems sometimes occur. Call your health care provider if you have any problems or questions after your procedure. WHAT TO EXPECT AFTER THE PROCEDURE After your procedure, it is typical to feel:  Soreness in your throat.  Pain with swallowing.  Sick to your stomach (nauseous).  Bloated.  Dizzy.  Fatigued. HOME CARE INSTRUCTIONS  Do not eat or drink anything until the numbing medicine (local anesthetic) has worn off and your gag reflex has returned. You will know that the local anesthetic has worn off when you can swallow comfortably.  Do not drive or operate machinery until directed by your health care provider.  Take medicines only as directed by your health care provider. SEEK MEDICAL CARE IF:   You cannot stop coughing.  You are not urinating at all or less than usual. SEEK IMMEDIATE MEDICAL CARE IF:  You have difficulty swallowing.  You cannot eat or drink.  You have worsening throat or chest pain.  You have dizziness or lightheadedness or you faint.  You have nausea or vomiting.  You have chills.  You have a fever.  You have severe abdominal pain.  You have black, tarry, or bloody stools.   This information is not intended to replace advice given to you by your health care provider. Make sure you discuss any questions you have with your health care provider.   Document Released: 11/01/2012 Document Revised: 12/06/2014 Document Reviewed: 11/01/2012 Elsevier Interactive Patient Education 2016 [employer].  Colonoscopy, Care After These instructions give you information on caring for  yourself after your procedure. Your doctor may also give you more specific instructions. Call your doctor if you have any problems or questions after your procedure. HOME CARE Do not drive for 24 hours. Do not sign important papers or use machinery for 24 hours. You may shower. You may go back to your usual activities, but go slower for the first 24 hours. Take rest breaks often during the first 24 hours. Walk around or use warm packs on your belly (abdomen) if you have belly cramping or gas. Drink enough fluids to keep your pee (urine) clear or pale yellow. Resume your normal diet. Avoid heavy or fried foods. Avoid drinking alcohol for 24 hours or as told by your doctor. Only take medicines as told by your doctor. If a tissue sample (biopsy) was taken during the procedure:  Do not take aspirin or blood thinners for 7 days, or as told by your doctor. Do not drink alcohol for 7 days, or as told by your doctor. Eat soft foods for the first 24 hours. GET HELP IF: You still have a small amount of blood in your poop (stool) 2-3 days after the procedure. GET HELP RIGHT AWAY IF: You have more than a small amount of blood in your poop. You see clumps of tissue (blood clots) in your poop. Your belly is puffy (swollen). You feel sick to your stomach (nauseous) or throw up (vomit). You have a fever. You have belly pain that gets worse and medicine does not help. MAKE SURE YOU: Understand these instructions. Will watch your condition. Will get help right away if you are not doing well or get  worse.   This information is not intended to replace advice given to you by your health care provider. Make sure you discuss any questions you have with your health care provider.   Document Released: 12/18/2010 Document Revised: 11/20/2013 Document Reviewed: 07/23/2013 Elsevier Interactive Patient Education [employer].

## 2016-05-27 NOTE — Interval H&P Note (Signed)
History and Physical Interval Note:  05/27/2016 9:08 AM  [name]  has presented today for surgery, with the diagnosis of luq pain/acid reflux  The various methods of treatment have been discussed with the patient and family. After consideration of risks, benefits and other options for treatment, the patient has consented to  Procedure(s): ESOPHAGOGASTRODUODENOSCOPY (EGD) WITH PROPOFOL (N/A) COLONOSCOPY WITH PROPOFOL (N/A) as a surgical intervention .  The patient's history has been reviewed, patient examined, no change in status, stable for surgery.  I have reviewed the patient's chart and labs.  Questions were answered to the patient's satisfaction.     [place] C.

## 2016-05-27 NOTE — Op Note (Signed)
[facility] Patient Name: Kathleen Miller Procedure Date: 05/27/2016 MRN: [id number] Attending MD: Lear Ng , MD Date of Birth: 03-05-1946 CSN: [id number] Age: 70 Admit Type: Outpatient Procedure:                Upper GI endoscopy Indications:              Abdominal pain in the left upper quadrant,                            Esophageal reflux Providers:                Lear Ng, MD, Hilma Favors, RN, Alfonso Patten, Technician, Rosario Adie, CRNA Referring MD:              Medicines:                Propofol per Anesthesia, Monitored Anesthesia Care Complications:            No immediate complications. Estimated Blood Loss:     Estimated blood loss: none. Procedure:                Pre-Anesthesia Assessment:                           - Prior to the procedure, a History and Physical                            was performed, and patient medications and                            allergies were reviewed. The patient's tolerance of                            previous anesthesia was also reviewed. The risks                            and benefits of the procedure and the sedation                            options and risks were discussed with the patient.                            All questions were answered, and informed consent                            was obtained. Prior Anticoagulants: The patient has                            taken no previous anticoagulant or antiplatelet                            agents. ASA Grade Assessment: III - A patient with  severe systemic disease. After reviewing the risks                            and benefits, the patient was deemed in                            satisfactory condition to undergo the procedure.                           After obtaining informed consent, the endoscope was                            passed under direct vision. Throughout the                        procedure, the patient's blood pressure, pulse, and                            oxygen saturations were monitored continuously. The                            [id number]) scope was introduced through the                            mouth, and advanced to the afferent and efferent                            jejunal loops. The upper GI endoscopy was                            accomplished without difficulty. The patient                            tolerated the procedure well. Scope In: Scope Out: Findings:      The examined esophagus was normal.      The Z-line was regular and was found 42 cm from the incisors.      Evidence of a Roux-en-Y gastrojejunostomy was found. The gastrojejunal       anastomosis was characterized by healthy appearing mucosa. This was       traversed. The pouch-to-jejunum limb was characterized by healthy       appearing mucosa. The jejunojejunal anastomosis was characterized by       healthy appearing mucosa.      The examined jejunum was normal. Impression:               - Normal esophagus.                           - Z-line regular, 42 cm from the incisors.                           - Roux-en-Y gastrojejunostomy with gastrojejunal                            anastomosis characterized by healthy appearing  mucosa.                           - Normal examined jejunum.                           - No specimens collected. Moderate Sedation:      N/A- Per Anesthesia Care Recommendation:           - Use Prilosec (omeprazole) 20 mg PO daily.                           - Follow an antireflux regimen.                           - Continue present medications. Procedure Code(s):        --- Professional ---                           [phone], Esophagogastroduodenoscopy, flexible,                            transoral; diagnostic, including collection of                            specimen(s) by brushing or washing, when  performed                            (separate procedure) Diagnosis Code(s):        --- Professional ---                           K21.9, Gastro-esophageal reflux disease without                            esophagitis                           R10.12, Left upper quadrant pain                           Z98.0, Intestinal bypass and anastomosis status CPT copyright 2016 [employer]. All rights reserved. The codes documented in this report are preliminary and upon coder review may  be revised to meet current compliance requirements. Lear Ng, MD 05/27/2016 10:28:07 AM This report has been signed electronically. Number of Addenda: 0

## 2016-05-27 NOTE — Op Note (Signed)
[facility] Patient Name: Kathleen Miller Procedure Date: 05/27/2016 MRN: [id number] Attending MD: Lear Ng , MD Date of Birth: 1946-04-21 CSN: [id number] Age: 70 Admit Type: Outpatient Procedure:                Colonoscopy Indications:              This is the patient's first colonoscopy, Abdominal                            pain in the left upper quadrant Providers:                Lear Ng, MD, Hilma Favors, RN, Alfonso Patten, Technician, Rosario Adie, CRNA Referring MD:              Medicines:                Propofol per Anesthesia, Monitored Anesthesia Care Complications:            No immediate complications. Estimated Blood Loss:     Estimated blood loss was minimal. Procedure:                Pre-Anesthesia Assessment:                           - Prior to the procedure, a History and Physical                            was performed, and patient medications and                            allergies were reviewed. The patient's tolerance of                            previous anesthesia was also reviewed. The risks                            and benefits of the procedure and the sedation                            options and risks were discussed with the patient.                            All questions were answered, and informed consent                            was obtained. Prior Anticoagulants: The patient has                            taken no previous anticoagulant or antiplatelet                            agents. ASA Grade Assessment: III - A patient with  severe systemic disease. After reviewing the risks                            and benefits, the patient was deemed in                            satisfactory condition to undergo the procedure.                           After obtaining informed consent, the colonoscope                            was passed under direct  vision. Throughout the                            procedure, the patient's blood pressure, pulse, and                            oxygen saturations were monitored continuously. The                            [id number] scope was introduced through                            the anus and advanced to the the cecum, identified                            by appendiceal orifice and ileocecal valve. The                            colonoscopy was performed with difficulty due to                            significant looping and a tortuous colon.                            Successful completion of the procedure was aided by                            straightening and shortening the scope to obtain                            bowel loop reduction and applying abdominal                            pressure. The patient tolerated the procedure                            fairly well. The quality of the bowel preparation                            was adequate and good. The terminal ileum,  ileocecal valve, appendiceal orifice, and rectum                            were photographed. Scope In: 9:24:02 AM Scope Out: 9:58:57 AM Scope Withdrawal Time: 0 hours 21 minutes 20 seconds  Total Procedure Duration: 0 hours 34 minutes 55 seconds  Findings:      The perianal and digital rectal examinations were normal.      A 10 mm polyp was found in the sigmoid colon. The polyp was sessile. The       polyp was removed with a hot snare. Resection and retrieval were       complete. Estimated blood loss: none.      A 10 mm polyp was found in the cecum. The polyp was semi-sessile. The       polyp was removed with a hot snare. Resection and retrieval were       complete. Estimated blood loss: none.      A 5 mm polyp was found in the transverse colon. The polyp was       semi-sessile. The polyp was removed with a cold biopsy forceps.       Resection and retrieval were complete.  Estimated blood loss was minimal.      Normal mucosa was found in the entire colon. Biopsies for histology were       taken with a cold forceps from the left colon and transverse colon for       evaluation of microscopic colitis. Estimated blood loss was minimal.      Internal hemorrhoids were found during retroflexion. The hemorrhoids       were small and Grade I (internal hemorrhoids that do not prolapse).      The terminal ileum appeared normal. Impression:               - One 10 mm polyp in the sigmoid colon, removed                            with a hot snare. Resected and retrieved.                           - One 10 mm polyp in the cecum, removed with a hot                            snare. Resected and retrieved.                           - One 5 mm polyp in the transverse colon, removed                            with a cold biopsy forceps. Resected and retrieved.                           - Normal mucosa in the entire examined colon.                            Biopsied.                           -  Internal hemorrhoids.                           - The examined portion of the ileum was normal. Moderate Sedation:      N/A- Per Anesthesia Care Recommendation:           - Await pathology results.                           - Continue present medications.                           - Repeat colonoscopy for surveillance based on                            pathology results.                           - Resume previous diet. Procedure Code(s):        --- Professional ---                           [phone], Colonoscopy, flexible; with removal of                            tumor(s), polyp(s), or other lesion(s) by snare                            technique                           45380, 59, Colonoscopy, flexible; with biopsy,                            single or multiple Diagnosis Code(s):        --- Professional ---                           R10.12, Left upper quadrant pain                            D12.3, Benign neoplasm of transverse colon (hepatic                            flexure or splenic flexure)                           D12.0, Benign neoplasm of cecum                           D12.5, Benign neoplasm of sigmoid colon                           K64.0, First degree hemorrhoids CPT copyright 2016 [employer]. All rights reserved. The codes documented in this report are preliminary and upon coder review may  be revised to meet current compliance requirements. Lear Ng, MD 05/27/2016 10:33:55 AM This report has been signed electronically. Number of Addenda:  0 

## 2016-05-27 NOTE — Transfer of Care (Signed)
Immediate Anesthesia Transfer of Care Note  Patient: Kathleen Miller  Procedure(s) Performed: Procedure(s): ESOPHAGOGASTRODUODENOSCOPY (EGD) WITH PROPOFOL (N/A) COLONOSCOPY WITH PROPOFOL (N/A)  Patient Location: PACU  Anesthesia Type:MAC  Level of Consciousness: awake, alert  and oriented  Airway & Oxygen Therapy: Patient Spontanous Breathing and Patient connected to nasal cannula oxygen  Post-op Assessment: Report given to RN and Post -op Vital signs reviewed and stable  Post vital signs: Reviewed and stable  Last Vitals:  Filed Vitals:   05/27/16 0808  BP: 147/68  Pulse: 78  Temp: 36.7 C  Resp: 16    Last Pain: There were no vitals filed for this visit.       Complications: No apparent anesthesia complications

## 2016-05-27 NOTE — Anesthesia Postprocedure Evaluation (Signed)
Anesthesia Post Note  Patient: Kathleen Miller  Procedure(s) Performed: Procedure(s) (LRB): ESOPHAGOGASTRODUODENOSCOPY (EGD) WITH PROPOFOL (N/A) COLONOSCOPY WITH PROPOFOL (N/A)  Patient location during evaluation: PACU Anesthesia Type: MAC Level of consciousness: awake and alert Pain management: pain level controlled Vital Signs Assessment: post-procedure vital signs reviewed and stable Respiratory status: spontaneous breathing and respiratory function stable Cardiovascular status: stable Anesthetic complications: no    Last Vitals:  Filed Vitals:   05/27/16 1030 05/27/16 1040  BP: 125/69 123/74  Pulse: 75 77  Temp:    Resp: 21 22    Last Pain:  Filed Vitals:   05/27/16 1046  PainSc: 3                  [name] DANIEL

## 2016-05-28 ENCOUNTER — Encounter (HOSPITAL_COMMUNITY): Payer: Self-pay | Admitting: Gastroenterology

## 2016-05-31 ENCOUNTER — Encounter (HOSPITAL_COMMUNITY): Payer: Self-pay | Admitting: Gastroenterology

## 2016-07-09 ENCOUNTER — Encounter: Payer: Self-pay | Admitting: *Deleted

## 2016-07-09 ENCOUNTER — Telehealth: Payer: Self-pay | Admitting: *Deleted

## 2016-07-09 NOTE — Telephone Encounter (Signed)
Pre-Visit Call completed with patient and chart updated.   Pre-Visit Info documented in Specialty Comments under [employer].    

## 2016-07-12 ENCOUNTER — Encounter: Payer: Self-pay | Admitting: Family Medicine

## 2016-07-12 ENCOUNTER — Ambulatory Visit (INDEPENDENT_AMBULATORY_CARE_PROVIDER_SITE_OTHER): Payer: Medicare HMO | Admitting: Family Medicine

## 2016-07-12 VITALS — BP 147/88 | HR 74 | Temp 98.4°F | Ht 63.0 in | Wt 291.6 lb

## 2016-07-12 DIAGNOSIS — IMO0001 Reserved for inherently not codable concepts without codable children: Secondary | ICD-10-CM

## 2016-07-12 DIAGNOSIS — R7989 Other specified abnormal findings of blood chemistry: Secondary | ICD-10-CM | POA: Diagnosis not present

## 2016-07-12 DIAGNOSIS — R030 Elevated blood-pressure reading, without diagnosis of hypertension: Secondary | ICD-10-CM

## 2016-07-12 DIAGNOSIS — Z711 Person with feared health complaint in whom no diagnosis is made: Secondary | ICD-10-CM | POA: Diagnosis not present

## 2016-07-12 DIAGNOSIS — E785 Hyperlipidemia, unspecified: Secondary | ICD-10-CM | POA: Insufficient documentation

## 2016-07-12 DIAGNOSIS — R635 Abnormal weight gain: Secondary | ICD-10-CM | POA: Diagnosis not present

## 2016-07-12 DIAGNOSIS — Z131 Encounter for screening for diabetes mellitus: Secondary | ICD-10-CM | POA: Diagnosis not present

## 2016-07-12 LAB — LIPID PANEL
CHOLESTEROL: 198 mg/dL (ref 0–200)
HDL: 37 mg/dL — AB (ref >39.00)
NonHDL: 161.23
TRIGLYCERIDES: 247 mg/dL — AB (ref 0.0–149.0)
Total CHOL/HDL Ratio: 5
VLDL: 49.4 mg/dL — AB (ref 0.0–40.0)

## 2016-07-12 LAB — HEMOGLOBIN A1C: Hgb A1c MFr Bld: 6.5 % (ref 4.6–6.5)

## 2016-07-12 LAB — CBC
HEMATOCRIT: 36.2 % (ref 36.0–46.0)
HEMOGLOBIN: 11.7 g/dL — AB (ref 12.0–15.0)
MCHC: 32.4 g/dL (ref 30.0–36.0)
MCV: 81.9 fl (ref 78.0–100.0)
PLATELETS: 478 10*3/uL — AB (ref 150.0–400.0)
RBC: 4.42 Mil/uL (ref 3.87–5.11)
RDW: 15.2 % (ref 11.5–15.5)
WBC: 9.8 10*3/uL (ref 4.0–10.5)

## 2016-07-12 LAB — COMPREHENSIVE METABOLIC PANEL
ALBUMIN: 3.8 g/dL (ref 3.5–5.2)
ALK PHOS: 121 U/L — AB (ref 39–117)
ALT: 24 U/L (ref 0–35)
AST: 22 U/L (ref 0–37)
BILIRUBIN TOTAL: 0.3 mg/dL (ref 0.2–1.2)
BUN: 12 mg/dL (ref 6–23)
CALCIUM: 9.5 mg/dL (ref 8.4–10.5)
CO2: 28 mEq/L (ref 19–32)
Chloride: 101 mEq/L (ref 96–112)
Creatinine, Ser: 0.77 mg/dL (ref 0.40–1.20)
GFR: 79.23 mL/min (ref >60.00)
Glucose, Bld: 106 mg/dL — ABNORMAL HIGH (ref 70–99)
POTASSIUM: 4.1 meq/L (ref 3.5–5.1)
Sodium: 135 mEq/L (ref 135–145)
TOTAL PROTEIN: 7.5 g/dL (ref 6.0–8.3)

## 2016-07-12 LAB — LDL CHOLESTEROL, DIRECT: Direct LDL: 124 mg/dL

## 2016-07-12 LAB — TSH: TSH: 2.17 u[IU]/mL (ref 0.35–4.50)

## 2016-07-12 NOTE — Patient Instructions (Signed)
We will get labs today to monitor your blood sugar, thyroid, liver and kidney function and blood count.  You have CT scans of your chest, abdomen and pelvis in June that did not show any cause of the swelling that you have noticed.  Your negative scans along with the long term nature of this symptom are evidence that this is not anything dangerous (although I cannot give you an exact explanation for your sensation of swelling)  I will be in touch with your lab results asap on mychart.  Let's plan to meet in about one month to follow-up and check your blood pressure

## 2016-07-12 NOTE — Progress Notes (Signed)
Pre visit review using our clinic review tool, if applicable. No additional management support is needed unless otherwise documented below in the visit note. 

## 2016-07-12 NOTE — Progress Notes (Signed)
[place] [facility] [address], [place], [place] [address] [phone]  Date:  07/12/2016   Name:  Kathleen Miller   DOB:  [birth year]   MRN:  [id number]  PCP:  Lamar Blinks, MD    Chief Complaint: Establish Care (Pt here to est care. c/o swelling under left breast and goes down left side. Pt states that she was admitted herslef into the hospital two yrs ago for same sx's. )   History of Present Illness:  Kathleen Miller is a 70 y.o. very pleasant female patient who presents with the following:  Here today as a new patient- history of GERD, ?DM, CHF, HTN, obesity Her GI is Dr. Michail Sermon- he recently did an upper GI and colonoscopy that were negative.    She has pre-diabetes but she does not have DM per her knowledge.  She also was not really aware of having CHF and does not have any sx of same.  Most recent echo was in 2014, EF 40-45%.  In 2014 she went to the hospital in 2014 and was admitted for chest pressure; eval did not show CAD, she had mild CHF and was discharged with medical therapy.  Since that time she describes "swelling under my left breast, it goes all the way down to my belly button.  Every now and then I will have a sharp pain. The swelling is actually hard to the touch."  She states that she has been mentioning this to "every doctor that I see."  This is why she has decided to see a new PCP "to find out if I have cancer" (causing the swelling).  Then states that she started taking a tsp of apple cider vinegar 3x a week and that this seems to have cured the swelling by triggering a bowel movement. Apparently she is supposed to have dental work but told her dentist that she may have a terminal illness so he wanted to make sure it was appropriate to proceed with her planned dental work.   She did have a CTof her chest, abd and pelvis in June which did not show anything to explain the swelling she has noticed.   She  notes that she had gastric bypass in the 1980s, "but it didn't take."  Also states that she had a hysterctomy about 5 years ago "and they nicked an organ, I don't remember which organ, that might be what it causing this."  She then was in "a work related accident" in 2011, "and I gained 130 lbs," and now has chronic pain She is seen by Dr. Ace Gins and is treated with flexeril and tramadol prn  She uses lasix once a week for swelling of her legs, may take on occasion other days too She trains employees for [employer]  She was on lisinopril and coreg, but she stopped taking them in March as she was running out.  She states that her BP is always ok when she will see [place] and she does not know if she needs BP medication anyway  BP Readings from Last 3 Encounters:  07/12/16 (!) 147/88  05/27/16 123/74  05/10/16 (!) 111/54    Patient Active Problem List   Diagnosis Date Noted  . LUQ pain 05/27/2016  . GERD (gastroesophageal reflux disease) 05/27/2016  . Hyperkalemia 03/19/2015  . Encounter for screening mammogram for breast cancer 11/26/2014  . Slow transit constipation 06/06/2014  . Breast cancer screening 06/06/2014  .  DM (diabetes mellitus) type 2, uncontrolled, with ketoacidosis ([place]) 03/05/2014  . Diabetes ([place]) 12/04/2013  . Chronic combined systolic and diastolic heart failure ([place]) 12/04/2013  . Essential hypertension, benign 12/04/2013  . Back pain, chronic 12/04/2013  . Morbid obesity ([place]) 08/04/2013  . Acute combined systolic and diastolic congestive heart failure ([place]) 08/02/2013  . Type II or unspecified type diabetes mellitus without mention of complication, uncontrolled 08/02/2013    Past Medical History:  Diagnosis Date  . Chronic back pain   . Chronic combined systolic and diastolic CHF (congestive heart failure) ([place])    a. Echo (08/02/13):  EF 45-50%, diff HK, Gr 1 DD, MAC, mild LAE, normal RVSF, PASP 36;  b. CM likely related to untreated HTN  . Depression    . Diabetes mellitus without complication ([place])   . Hypertension     Past Surgical History:  Procedure Laterality Date  . ABDOMINAL HYSTERECTOMY  11/2011  . CHOLECYSTECTOMY  1988  . COLONOSCOPY WITH PROPOFOL N/A 05/27/2016   Procedure: COLONOSCOPY WITH PROPOFOL;  Surgeon: Wilford Corner, MD;  Location: [facility];  Service: Endoscopy;  Laterality: N/A;  . ESOPHAGOGASTRODUODENOSCOPY (EGD) WITH PROPOFOL N/A 05/27/2016   Procedure: ESOPHAGOGASTRODUODENOSCOPY (EGD) WITH PROPOFOL;  Surgeon: Wilford Corner, MD;  Location: [facility];  Service: Endoscopy;  Laterality: N/A;  . GASTRIC BYPASS    . SPLENECTOMY, TOTAL  1988    Social History  Substance Use Topics  . Smoking status: Never Smoker  . Smokeless tobacco: Never Used  . Alcohol use No    No family history on file.  Allergies  Allergen Reactions  . Nsaids Swelling and Other (See Comments)    Water on the pain     Medication list has been reviewed and updated.  Current Outpatient Prescriptions on File Prior to Visit  Medication Sig Dispense Refill  . Ascorbic Acid (VITAMIN C) 1000 MG tablet Take 1,000 mg by mouth daily.    Marland Kitchen aspirin EC 81 MG tablet Take 1 tablet (81 mg total) by mouth daily. 90 tablet 3  . carvedilol (COREG) 3.125 MG tablet Take 1 tablet (3.125 mg total) by mouth 2 (two) times daily with a meal. Must have office visit for refills (Patient not taking: Reported on 07/09/2016) 60 tablet 0  . cyclobenzaprine (FLEXERIL) 10 MG tablet Take 1 tablet (10 mg total) by mouth 3 (three) times daily as needed for muscle spasms. 180 tablet 3  . furosemide (LASIX) 40 MG tablet Take 1 tablet (40 mg total) by mouth daily. (Patient not taking: Reported on 07/09/2016) 90 tablet 3  . lisinopril (PRINIVIL,ZESTRIL) 2.5 MG tablet Take 1 tablet (2.5 mg total) by mouth daily. Must have office visit for refills (Patient not taking: Reported on 07/09/2016) 30 tablet 0  . lovastatin (MEVACOR) 20 MG tablet Take 1 tablet (20 mg total) by  mouth daily with breakfast. Must have office visit for refills (Patient not taking: Reported on 07/09/2016) 30 tablet 0  . metFORMIN (GLUCOPHAGE) 500 MG tablet Take 1 tablet (500 mg total) by mouth 2 (two) times daily with a meal. Must have office visit for refills (Patient not taking: Reported on 07/09/2016) 60 tablet 0  . Sennosides (EX-LAX PO) Take 1-2 tablets by mouth daily as needed (for constipation).    . traMADol (ULTRAM) 50 MG tablet Take 50 mg by mouth every 6 (six) hours as needed for moderate pain.     No current facility-administered medications on file prior to visit.     Review of Systems:  As per HPI- otherwise negative.   Physical Examination: Vitals:   07/12/16 0936 07/12/16 0939  BP: (!) 146/89 (!) 147/88  Pulse: 74   Temp: 98.4 F (36.9 C)    Vitals:   07/12/16 0936  Weight: 291 lb 9.6 oz (132.3 kg)  Height: 5\' 3"  (1.6 m)   Body mass index is 51.65 kg/m. Ideal Body Weight: Weight in (lb) to have BMI = 25: 140.8  GEN: WDWN, NAD, Non-toxic, A & O x 3, very obese HEENT: Atraumatic, Normocephalic. Neck supple. No masses, No LAD. Bilateral TM wnl, oropharynx normal.  PEERL,EOMI.   Ears and Nose: No external deformity. CV: RRR, No M/G/R. No JVD. No thrill. No extra heart sounds. PULM: CTA B, no wheezes, crackles, rhonchi. No retractions. No resp. distress. No accessory muscle use. ABD: S, NT, ND, +BS. No rebound. No HSM.  No visible or palpable bulges, hardness or hernia I am not able to appreciate any asymmetry of her abdomen or any unusual swelling.  She does have a midline scar over her abdomen from her gastric bypass operation EXTR: No c/c/e NEURO Normal gait for pt- uses a cane, slow gait PSYCH: Normally interactive. Conversant. Not depressed or anxious appearing.  Calm demeanor.    Assessment and Plan: Screening for diabetes mellitus - Plan: Comprehensive metabolic panel, Hemoglobin A1c  Weight gain - Plan: CBC, Comprehensive metabolic panel, Lipid  panel, TSH, Hemoglobin A1c  Morbid obesity, unspecified obesity type ([place]) - Plan: Lipid panel, TSH  Elevated blood pressure - Plan: TSH  Concern about cancer without diagnosis   I spent 45 minutes face to face with pt today, more than 50% in counseling and education New patient with complex history and concern about a long term swelling in her left belly (since 2014) that she was concerned meant that she had terminal cancer. Reassured her that recent CT scans were normal so I do not think that she has a cancerous mass.  Letter to her dentist There is a question of pre-diabetes vs DM- will check an A1c and other labs as above today Plan to recheck in 1 month to look at her BP and check on her progress   Signed Lamar Blinks, MD

## 2016-07-18 ENCOUNTER — Encounter: Payer: Self-pay | Admitting: Family Medicine

## 2016-07-18 DIAGNOSIS — G894 Chronic pain syndrome: Secondary | ICD-10-CM | POA: Insufficient documentation

## 2016-08-11 DIAGNOSIS — R69 Illness, unspecified: Secondary | ICD-10-CM | POA: Diagnosis not present

## 2016-08-12 ENCOUNTER — Ambulatory Visit (INDEPENDENT_AMBULATORY_CARE_PROVIDER_SITE_OTHER): Payer: Medicare HMO | Admitting: Family Medicine

## 2016-08-12 VITALS — BP 138/67 | HR 80 | Temp 98.4°F | Ht 63.0 in | Wt 289.4 lb

## 2016-08-12 DIAGNOSIS — R748 Abnormal levels of other serum enzymes: Secondary | ICD-10-CM | POA: Diagnosis not present

## 2016-08-12 DIAGNOSIS — E119 Type 2 diabetes mellitus without complications: Secondary | ICD-10-CM

## 2016-08-12 DIAGNOSIS — D649 Anemia, unspecified: Secondary | ICD-10-CM

## 2016-08-12 DIAGNOSIS — E785 Hyperlipidemia, unspecified: Secondary | ICD-10-CM

## 2016-08-12 DIAGNOSIS — Z711 Person with feared health complaint in whom no diagnosis is made: Secondary | ICD-10-CM

## 2016-08-12 DIAGNOSIS — R030 Elevated blood-pressure reading, without diagnosis of hypertension: Secondary | ICD-10-CM

## 2016-08-12 DIAGNOSIS — Z119 Encounter for screening for infectious and parasitic diseases, unspecified: Secondary | ICD-10-CM

## 2016-08-12 DIAGNOSIS — IMO0001 Reserved for inherently not codable concepts without codable children: Secondary | ICD-10-CM

## 2016-08-12 LAB — CBC
HCT: 36.7 % (ref 36.0–46.0)
Hemoglobin: 12.1 g/dL (ref 12.0–15.0)
MCHC: 32.9 g/dL (ref 30.0–36.0)
MCV: 80.5 fl (ref 78.0–100.0)
Platelets: 481 10*3/uL — ABNORMAL HIGH (ref 150.0–400.0)
RBC: 4.56 Mil/uL (ref 3.87–5.11)
RDW: 14.5 % (ref 11.5–15.5)
WBC: 9.4 10*3/uL (ref 4.0–10.5)

## 2016-08-12 LAB — HEPATIC FUNCTION PANEL
ALK PHOS: 124 U/L — AB (ref 39–117)
ALT: 20 U/L (ref 0–35)
AST: 20 U/L (ref 0–37)
Albumin: 3.7 g/dL (ref 3.5–5.2)
BILIRUBIN DIRECT: 0.1 mg/dL (ref 0.0–0.3)
Total Bilirubin: 0.3 mg/dL (ref 0.2–1.2)
Total Protein: 7.4 g/dL (ref 6.0–8.3)

## 2016-08-12 LAB — MICROALBUMIN / CREATININE URINE RATIO
CREATININE, U: 152.9 mg/dL
MICROALB/CREAT RATIO: 0.5 mg/g (ref 0.0–30.0)

## 2016-08-12 LAB — HEPATITIS C ANTIBODY: HCV Ab: NEGATIVE

## 2016-08-12 MED ORDER — LOVASTATIN 20 MG PO TABS: 20.0000 mg | ORAL_TABLET | Freq: Every day | ORAL | 3 refills | Status: DC

## 2016-08-12 MED ORDER — METFORMIN HCL 500 MG PO TABS: 500.0000 mg | ORAL_TABLET | Freq: Two times a day (BID) | ORAL | 3 refills | Status: DC

## 2016-08-12 NOTE — Progress Notes (Signed)
[place] at [facility] [address], [place], [place] [address] [phone]  Date:  08/12/2016   Name:  Kathleen Miller   DOB:  09-Mar-1946   MRN:  [id number]  PCP:  Lamar Blinks, MD    Chief Complaint: Follow-up (Pt here for f/u visit. )   History of Present Illness:  Kathleen Miller is a 71 y.o. very pleasant female patient who presents with the following:  Pt was seen here one month ago to establish care and do labs. See note from 8/14- partial HPI as follows.   In 2014 she went to the hospital in 2014 and was admitted for chest pressure; eval did not show CAD, she had mild CHF and was discharged with medical therapy.  Since that time she describes "swelling under my left breast, it goes all the way down to my belly button.  Every now and then I will have a sharp pain. The swelling is actually hard to the touch."  She states that she has been mentioning this to "every doctor that I see."  This is why she has decided to see a new PCP "to find out if I have cancer" (causing the swelling).  Then states that she started taking a tsp of apple cider vinegar 3x a week and that this seems to have cured the swelling by triggering a bowel movement. Apparently she is supposed to have dental work but told her dentist that she may have a terminal illness so he wanted to make sure it was appropriate to proceed with her planned dental work.  She did have a CTof her chest, abd and pelvis in June which did not show anything to explain the swelling she has noticed.  She notes that she had gastric bypass in the 1980s, "but it didn't take."  Also states that she had a hysterctomy about 5 years ago "and they nicked an organ, I don't remember which organ, that might be what it causing this."  She then was in "a work related accident" in 2011, "and I gained 130 lbs," and now has chronic pain She is seen by Dr. Ace Gins and is treated with flexeril and tramadol  prn  Sent mychart message regarding her labs as below,  She reports that she never saw this message and was not aware of her lab results.  Here are today's labs.  Thyroid is normal. Your CMP is normal except for a minimal elevation in your alkaline phosphatase; this elevation is so minimal that I do not think it is clinically significant- we can recheck this in a few months.  Your blood count shows a mild anemia- we will want to recheck this in a couple of months.  Your triglycerides are high and your HDL is low; this combination gives you a less favorable overall cholesterol picture. Finally, your A1c is at 6.5% which does qualify as diabetes.    I would recommend that we start a diabetes medication, specifically metformin, and that we also start a cholesterol med. Would this be ok with you? Please send me a message and let me know. Also, let's plan to recheck in 3 months to follow-up on labs.   She is again worried about the "swelling in my left abdomen" that has been present for the last 2 years.  CT scans of chest/ abd/ pelvis in June were normal.  She states that this swelling is most visible when she stands up, and  having a BM makes it go away.  Of note she has had a couple of complicated abd/ pelvic operations during her life and has a scar from her sternum to her pubis.  The area of her concern is just lateral to her healed incision scar  She also notes constipation over the last 2-3 months. This is a relatively new thing to her  She did have a negative colonoscopy in June.  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 She is hoping to save enough money to have her teeth repaired  She would like a referral to opthalmology- she lives in [place] and would like to see someone there.    Office Visit on 07/12/2016  Component Date Value Ref Range Status  . WBC 07/12/2016 9.8  4.0 - 10.5 K/uL Final  . RBC  07/12/2016 4.42  3.87 - 5.11 Mil/uL Final  . Platelets 07/12/2016 478.0* 150.0 - 400.0 K/uL Final  . Hemoglobin 07/12/2016 11.7* 12.0 - 15.0 g/dL Final  . HCT 07/12/2016 36.2  36.0 - 46.0 % Final  . MCV 07/12/2016 81.9  78.0 - 100.0 fl Final  . MCHC 07/12/2016 32.4  30.0 - 36.0 g/dL Final  . RDW 07/12/2016 15.2  11.5 - 15.5 % Final  . Sodium 07/12/2016 135  135 - 145 mEq/L Final  . Potassium 07/12/2016 4.1  3.5 - 5.1 mEq/L Final  . Chloride 07/12/2016 101  96 - 112 mEq/L Final  . CO2 07/12/2016 28  19 - 32 mEq/L Final  . Glucose, Bld 07/12/2016 106* 70 - 99 mg/dL Final  . BUN 07/12/2016 12  6 - 23 mg/dL Final  . Creatinine, Ser 07/12/2016 0.77  0.40 - 1.20 mg/dL Final  . Total Bilirubin 07/12/2016 0.3  0.2 - 1.2 mg/dL Final  . Alkaline Phosphatase 07/12/2016 121* 39 - 117 U/L Final  . AST 07/12/2016 22  0 - 37 U/L Final  . ALT 07/12/2016 24  0 - 35 U/L Final  . Total Protein 07/12/2016 7.5  6.0 - 8.3 g/dL Final  . Albumin 07/12/2016 3.8  3.5 - 5.2 g/dL Final  . Calcium 07/12/2016 9.5  8.4 - 10.5 mg/dL Final  . GFR 07/12/2016 79.23  >60.00 mL/min Final  . Cholesterol 07/12/2016 198  0 - 200 mg/dL Final  . Triglycerides 07/12/2016 247.0* 0.0 - 149.0 mg/dL Final  . HDL 07/12/2016 37.00* >39.00 mg/dL Final  . VLDL 07/12/2016 49.4* 0.0 - 40.0 mg/dL Final  . Total CHOL/HDL Ratio 07/12/2016 5   Final  . NonHDL 07/12/2016 161.23   Final  . TSH 07/12/2016 2.17  0.35 - 4.50 uIU/mL Final  . Hgb A1c MFr Bld 07/12/2016 6.5  4.6 - 6.5 % Final  . Direct LDL 07/12/2016 124.0  mg/dL Final     Patient Active Problem List   Diagnosis Date Noted  . Chronic pain syndrome 07/18/2016  . Dyslipidemia 07/12/2016  . LUQ pain 05/27/2016  . GERD (gastroesophageal reflux disease) 05/27/2016  . Encounter for screening mammogram for breast cancer 11/26/2014  . Slow transit constipation 06/06/2014  . Diabetes ([place]) 12/04/2013  . Chronic combined systolic and diastolic heart failure ([place]) 12/04/2013  .  Essential hypertension, benign 12/04/2013  . Spinal stenosis of lumbar region 12/04/2013  . Morbid obesity ([place]) 08/04/2013  . Acute combined systolic and diastolic congestive heart failure ([place]) 08/02/2013  . Type II or unspecified type diabetes mellitus without mention of  complication, uncontrolled 08/02/2013    Past Medical History:  Diagnosis Date  . Chronic back pain   . Chronic combined systolic and diastolic CHF (congestive heart failure) ([place])    a. Echo (08/02/13):  EF 45-50%, diff HK, Gr 1 DD, MAC, mild LAE, normal RVSF, PASP 36;  b. CM likely related to untreated HTN  . Depression   . Diabetes mellitus without complication ([place])   . Hypertension     Past Surgical History:  Procedure Laterality Date  . ABDOMINAL HYSTERECTOMY  11/2011  . CHOLECYSTECTOMY  1988  . COLONOSCOPY WITH PROPOFOL N/A 05/27/2016   Procedure: COLONOSCOPY WITH PROPOFOL;  Surgeon: Wilford Corner, MD;  Location: [facility];  Service: Endoscopy;  Laterality: N/A;  . ESOPHAGOGASTRODUODENOSCOPY (EGD) WITH PROPOFOL N/A 05/27/2016   Procedure: ESOPHAGOGASTRODUODENOSCOPY (EGD) WITH PROPOFOL;  Surgeon: Wilford Corner, MD;  Location: [facility];  Service: Endoscopy;  Laterality: N/A;  . GASTRIC BYPASS    . SPLENECTOMY, TOTAL  1988    Social History  Substance Use Topics  . Smoking status: Never Smoker  . Smokeless tobacco: Never Used  . Alcohol use No    No family history on file.  Allergies  Allergen Reactions  . Nsaids Swelling and Other (See Comments)    Water on the pain     Medication list has been reviewed and updated.  Current Outpatient Prescriptions on File Prior to Visit  Medication Sig Dispense Refill  . Ascorbic Acid (VITAMIN C) 1000 MG tablet Take 1,000 mg by mouth daily.    Marland Kitchen aspirin EC 81 MG tablet Take 1 tablet (81 mg total) by mouth daily. 90 tablet 3  . cyclobenzaprine (FLEXERIL) 10 MG tablet Take 1 tablet (10 mg total) by mouth 3 (three) times daily as needed for muscle  spasms. 180 tablet 3  . furosemide (LASIX) 40 MG tablet Take 1 tablet (40 mg total) by mouth daily. 90 tablet 3  . Sennosides (EX-LAX PO) Take 1-2 tablets by mouth daily as needed (for constipation).    . traMADol (ULTRAM) 50 MG tablet Take 50 mg by mouth every 6 (six) hours as needed for moderate pain.     No current facility-administered medications on file prior to visit.     Review of Systems:  As per HPI- otherwise negative.   Physical Examination: Vitals:   08/12/16 0900  BP: 138/67  Pulse: 80  Temp: 98.4 F (36.9 C)   Vitals:   08/12/16 0900  Weight: 289 lb 6.4 oz (131.3 kg)  Height: 5\' 3"  (1.6 m)   Body mass index is 51.26 kg/m. Ideal Body Weight: Weight in (lb) to have BMI = 25: 140.8  GEN: WDWN, NAD, Non-toxic, A & O x 3, obese, otherwise looks well HEENT: Atraumatic, Normocephalic. Neck supple. No masses, No LAD. Ears and Nose: No external deformity. CV: RRR, No M/G/R. No JVD. No thrill. No extra heart sounds. PULM: CTA B, no wheezes, crackles, rhonchi. No retractions. No resp. distress. No accessory muscle use. ABD: S, NT, ND, +BS. No rebound. No HSM.  See below for more details EXTR: No c/c/e NEURO moves slowly, uses cane PSYCH: Normally interactive. Conversant. Not depressed or anxious appearing.  Calm demeanor.  She has an incision from the sternum to the pubis from a past operation.  She notes the area of concern lateral to the umbilicus on the left; her surgical scar also tracks left of her umbilicus. When she stands up a subtle bulge is seen in this area which is not appreciated  when she is supine   Assessment and Plan: Controlled type 2 diabetes mellitus without complication, without long-term current use of insulin ([facility]) - Plan: metFORMIN (GLUCOPHAGE) 500 MG tablet, Ambulatory referral to Ophthalmology, Urine Microalbumin w/creat. ratio  Dyslipidemia - Plan: lovastatin (MEVACOR) 20 MG tablet  Elevated alkaline phosphatase level - Plan: Hepatic  function panel  Anemia, unspecified anemia type - Plan: CBC  Screening examination for infectious disease - Plan: Hepatitis C antibody  Morbid obesity, unspecified obesity type ([place])  Elevated blood pressure  Concern about cancer without diagnosis  Went over her labs from June which she had not received, started her on metformin and lovastatin, referral for an eye exam Labs pending as above Explained to her that her abdominal bulge that she notes when standing is likely due to muscular wall weakness due to her history of abdominal surgery.  Again explained that she recently had a CT scan and that this is a highly sensitive test for detecting an abnl in the belly- certainly it would not fail to detect a palpable mass.  She is reassured.   See patient instructions for more details.       Signed Lamar Blinks, MD

## 2016-08-12 NOTE — Patient Instructions (Signed)
We will set you up to see an eye doctor- this is important to do annually for all diabetic patients We will start you on lovastatin for your cholesterol- take it once daily.   We will also start metformin for your diabetes. Start with taking it once a day, and increase to twice a day after a week.    Please work on trying to lose weight- even just 10- 15 lbs can really improve your health I'll be in touch with your labs, and let's plan to visit again in 3 months.

## 2016-08-13 ENCOUNTER — Other Ambulatory Visit (INDEPENDENT_AMBULATORY_CARE_PROVIDER_SITE_OTHER): Payer: Medicare HMO

## 2016-08-13 DIAGNOSIS — R748 Abnormal levels of other serum enzymes: Secondary | ICD-10-CM

## 2016-08-13 LAB — GAMMA GT: GGT: 20 U/L (ref 7–51)

## 2016-10-11 DIAGNOSIS — R7309 Other abnormal glucose: Secondary | ICD-10-CM | POA: Diagnosis not present

## 2016-10-11 DIAGNOSIS — Z79899 Other long term (current) drug therapy: Secondary | ICD-10-CM | POA: Diagnosis not present

## 2016-10-11 DIAGNOSIS — I10 Essential (primary) hypertension: Secondary | ICD-10-CM | POA: Diagnosis not present

## 2016-10-11 DIAGNOSIS — E119 Type 2 diabetes mellitus without complications: Secondary | ICD-10-CM | POA: Diagnosis not present

## 2016-10-11 DIAGNOSIS — E039 Hypothyroidism, unspecified: Secondary | ICD-10-CM | POA: Diagnosis not present

## 2016-10-11 DIAGNOSIS — E782 Mixed hyperlipidemia: Secondary | ICD-10-CM | POA: Diagnosis not present

## 2016-10-11 DIAGNOSIS — E559 Vitamin D deficiency, unspecified: Secondary | ICD-10-CM | POA: Diagnosis not present

## 2016-10-17 LAB — LIPID PANEL
CHOLESTEROL: 166 mg/dL (ref 0–200)
HDL: 36 mg/dL (ref 35–70)
LDL Cholesterol: 107 mg/dL
TRIGLYCERIDES: 214 mg/dL — AB (ref 40–160)

## 2016-10-17 LAB — HEMOGLOBIN A1C: Hemoglobin A1C: 6.2

## 2016-10-17 LAB — TSH: TSH: 2.31 u[IU]/mL (ref 0.41–5.90)

## 2016-11-11 ENCOUNTER — Ambulatory Visit (INDEPENDENT_AMBULATORY_CARE_PROVIDER_SITE_OTHER): Payer: Medicare HMO | Admitting: Family Medicine

## 2016-11-11 VITALS — BP 124/76 | HR 76 | Temp 98.8°F | Ht 63.0 in | Wt 288.6 lb

## 2016-11-11 DIAGNOSIS — E119 Type 2 diabetes mellitus without complications: Secondary | ICD-10-CM

## 2016-11-11 DIAGNOSIS — E785 Hyperlipidemia, unspecified: Secondary | ICD-10-CM

## 2016-11-11 DIAGNOSIS — I5042 Chronic combined systolic (congestive) and diastolic (congestive) heart failure: Secondary | ICD-10-CM

## 2016-11-11 DIAGNOSIS — R1900 Intra-abdominal and pelvic swelling, mass and lump, unspecified site: Secondary | ICD-10-CM | POA: Diagnosis not present

## 2016-11-11 DIAGNOSIS — R899 Unspecified abnormal finding in specimens from other organs, systems and tissues: Secondary | ICD-10-CM

## 2016-11-11 MED ORDER — METFORMIN HCL 500 MG PO TABS: 500.0000 mg | ORAL_TABLET | Freq: Two times a day (BID) | ORAL | 3 refills | Status: DC

## 2016-11-11 MED ORDER — LOVASTATIN 20 MG PO TABS: 20.0000 mg | ORAL_TABLET | Freq: Every day | ORAL | 3 refills | Status: DC

## 2016-11-11 MED ORDER — FUROSEMIDE 40 MG PO TABS: 40.0000 mg | ORAL_TABLET | Freq: Every day | ORAL | 3 refills | Status: DC

## 2016-11-11 NOTE — Patient Instructions (Addendum)
I think your idea about exercising on a treadmill is a great idea.  Your blood sugar appears to be under good control I also think that your cholesterol is reasonable- we will want to follow-up on your cholesterol in 4-6 months. If we could do fasting labs that would be idea!

## 2016-11-11 NOTE — Progress Notes (Signed)
[place] at [facility] [address], [place], [place] [address] [id number] [phone]  Date:  11/11/2016   Name:  Kathleen Miller   DOB:  05-10-1946   MRN:  [id number]  PCP:  Lamar Blinks, MD    Chief Complaint: No chief complaint on file.   History of Present Illness:  Kathleen Miller is a 70 y.o. very pleasant female patient who presents with the following:  Here today for a 3 month follow-up - plan from last visit in September-   Went over her labs from June which she had not received, started her on metformin and lovastatin, referral for an eye exam Labs pending as above Explained to her that her abdominal bulge that she notes when standing is likely due to muscular wall weakness due to her history of abdominal surgery.  Again explained that she recently had a CT scan and that this is a highly sensitive test for detecting an abnl in the belly- certainly it would not fail to detect a palpable mass.  She is reassured.   Today, she is feeling "ok." Foot exam is due, eye exam is due mammo is UTD dexa scan: never had  She sees Dr. Ace Gins for her pain management- they did some sort of blood panel through "[facility]," they have done a lot of non- standard labs such as alpha 1,2,3,4 levels, fibrinogen, C peptide, MPO levels.  Kathleen Miller reports that she was called in urgently to discuss her elevated MPO level.  She would like me to explain what these labs signify; advised that I am not familiar with these tests and would not generally order them.  Would defer to Dr. Ace Gins who ordered these tests.   She is on prn tramadol, prn flexeril for her chronic painat this time  She is on asa, lasix, lovastatin 20, metformin BID She has recently gotten a treadmill and hopes to get more exercise and lose weight She continues to be afraid that her left sided abd bulge is due to a serious disease that has not yet been discovered.  She notes that the  left side of her belly is more prominent when she is upright- she has a large midline scar on her belly.  Went over her recent CT again as above. Offered to have her see a Psychologist, sport and exercise or another doctor if she would like another opinion about this- she declines at this time    Weight is stable Wt Readings from Last 3 Encounters:  11/11/16 288 lb 9.6 oz (130.9 kg)  08/12/16 289 lb 6.4 oz (131.3 kg)  07/12/16 291 lb 9.6 oz (132.3 kg)    Lab Results  Component Value Date   HGBA1C 6.5 07/12/2016   She did get an A1c and lipid panel per Dr. Ace Gins last month which look ok- A1c is 6.2% Total CHL 166,tris are high and HDL is low however pt was not fasting Will abstract into chart   Patient Active Problem List   Diagnosis Date Noted  . Chronic pain syndrome 07/18/2016  . Dyslipidemia 07/12/2016  . LUQ pain 05/27/2016  . GERD (gastroesophageal reflux disease) 05/27/2016  . Encounter for screening mammogram for breast cancer 11/26/2014  . Slow transit constipation 06/06/2014  . Diabetes ([place]) 12/04/2013  . Chronic combined systolic and diastolic heart failure ([place]) 12/04/2013  . Essential hypertension, benign 12/04/2013  . Spinal stenosis of lumbar region 12/04/2013  . Morbid obesity ([place]) 08/04/2013  . Acute combined  systolic and diastolic congestive heart failure ([place]) 08/02/2013  . Type II or unspecified type diabetes mellitus without mention of complication, uncontrolled 08/02/2013    Past Medical History:  Diagnosis Date  . Chronic back pain   . Chronic combined systolic and diastolic CHF (congestive heart failure) ([place])    a. Echo (08/02/13):  EF 45-50%, diff HK, Gr 1 DD, MAC, mild LAE, normal RVSF, PASP 36;  b. CM likely related to untreated HTN  . Depression   . Diabetes mellitus without complication ([place])   . Hypertension     Past Surgical History:  Procedure Laterality Date  . ABDOMINAL HYSTERECTOMY  11/2011  . CHOLECYSTECTOMY  1988  . COLONOSCOPY WITH PROPOFOL N/A 05/27/2016    Procedure: COLONOSCOPY WITH PROPOFOL;  Surgeon: Wilford Corner, MD;  [facility];  Service: Endoscopy;  Laterality: N/A;  . ESOPHAGOGASTRODUODENOSCOPY (EGD) WITH PROPOFOL N/A 05/27/2016   Procedure: ESOPHAGOGASTRODUODENOSCOPY (EGD) WITH PROPOFOL;  Surgeon: Wilford Corner, MD;  [facility];  Service: Endoscopy;  Laterality: N/A;  . GASTRIC BYPASS    . SPLENECTOMY, TOTAL  1988    Social History  Substance Use Topics  . Smoking status: Never Smoker  . Smokeless tobacco: Never Used  . Alcohol use No    No family history on file.  Allergies  Allergen Reactions  . Nsaids Swelling and Other (See Comments)    Water on the pain     Medication list has been reviewed and updated.  Current Outpatient Prescriptions on File Prior to Visit  Medication Sig Dispense Refill  . Ascorbic Acid (VITAMIN C) 1000 MG tablet Take 1,000 mg by mouth daily.    Marland Kitchen aspirin EC 81 MG tablet Take 1 tablet (81 mg total) by mouth daily. 90 tablet 3  . cyclobenzaprine (FLEXERIL) 10 MG tablet Take 1 tablet (10 mg total) by mouth 3 (three) times daily as needed for muscle spasms. 180 tablet 3  . furosemide (LASIX) 40 MG tablet Take 1 tablet (40 mg total) by mouth daily. 90 tablet 3  . lovastatin (MEVACOR) 20 MG tablet Take 1 tablet (20 mg total) by mouth at bedtime. 90 tablet 3  . metFORMIN (GLUCOPHAGE) 500 MG tablet Take 1 tablet (500 mg total) by mouth 2 (two) times daily with a meal. 180 tablet 3  . Sennosides (EX-LAX PO) Take 1-2 tablets by mouth daily as needed (for constipation).    . traMADol (ULTRAM) 50 MG tablet Take 50 mg by mouth every 6 (six) hours as needed for moderate pain.     No current facility-administered medications on file prior to visit.     Review of Systems:  As per HPI- otherwise negative.   Physical Examination: Blood pressure 124/76, pulse 76, temperature 98.8 F (37.1 C), temperature source Oral, height 5\' 3"  (1.6 m), weight 288 lb 9.6 oz (130.9 kg), SpO2  95 %.  Ideal Body Weight:    GEN: WDWN, NAD, Non-toxic, A & O x 3, obese, looks well HEENT: Atraumatic, Normocephalic. Neck supple. No masses, No LAD. Ears and Nose: No external deformity. CV: RRR, No M/G/R. No JVD. No thrill. No extra heart sounds. PULM: CTA B, no wheezes, crackles, rhonchi. No retractions. No resp. distress. No accessory muscle use. ABD: S, NT, ND, +BS. No rebound. No HSM.  She has a large midline scar on her belly from prior operation.   EXTR: No c/c/e NEURO Normal gait.  PSYCH: Normally interactive. Conversant. Not depressed or anxious appearing.  Calm demeanor.    Assessment and  Plan: Abdominal wall bulge  Chronic combined systolic and diastolic heart failure ([facility]) - Plan: furosemide (LASIX) 40 MG tablet  Dyslipidemia - Plan: lovastatin (MEVACOR) 20 MG tablet  Controlled type 2 diabetes mellitus without complication, without long-term current use of insulin ([place]) - Plan: metFORMIN (GLUCOPHAGE) 500 MG tablet  Abnormal laboratory test  Again discussed her abd wall bulge. Explained that to the best of my knowledge this is due to muscle wall weakness and defect from prior operation. Offered to send for another opinion but she declines.  We did not get to go over her health maint today as she had many questions about recent extensive lab panel per Dr. Ace Gins Discussion as above Asked her to please see me for a re-check and FASTING labs - she plans to see me in February which is perfect Refilled her lasix and lovastatin, metformin today Recent A1c was at goal   Signed Lamar Blinks, MD

## 2016-11-11 NOTE — Progress Notes (Signed)
Pre visit review using our clinic review tool, if applicable. No additional management support is needed unless otherwise documented below in the visit note. 

## 2016-11-19 ENCOUNTER — Encounter: Payer: Self-pay | Admitting: Family Medicine

## 2016-12-29 DIAGNOSIS — H43812 Vitreous degeneration, left eye: Secondary | ICD-10-CM | POA: Diagnosis not present

## 2016-12-29 DIAGNOSIS — H43392 Other vitreous opacities, left eye: Secondary | ICD-10-CM | POA: Diagnosis not present

## 2017-01-20 ENCOUNTER — Telehealth: Payer: Self-pay | Admitting: *Deleted

## 2017-01-20 NOTE — Telephone Encounter (Signed)
Scheduled AWV 01/26/17 @8 .

## 2017-01-24 NOTE — Progress Notes (Signed)
Pre visit review using our clinic review tool, if applicable. No additional management support is needed unless otherwise documented below in the visit note. 

## 2017-01-24 NOTE — Progress Notes (Signed)
Subjective:   Kathleen Miller is a 71 y.o. female who presents for an Initial Medicare Annual Wellness Visit.  Concerned about chronic left abd mass. Denies pain but states "it is hard as a rock."  Pt concerned about thyroid, not loosing weight or sleeping well.   Review of Systems    No ROS.  Medicare Wellness Visit.  Cardiac Risk Factors include: advanced age (>66mn, >>63women);diabetes mellitus;hypertension;obesity (BMI >30kg/m2)   Sleep patterns: Naps I the late afternoon for 2 hours Sleeps 4-6 hrs/night.   Home Safety/Smoke Alarms:   Living environment; residence and Firearm Safety: Lives alone with dog. Seat Belt Safety/Bike Helmet: Wears seat belt.   Counseling:   Eye Exam- Pt has new onset of floaters and went to eye dr 2 weeks ago. Follows up mid march.  Dental- Dental surgery soon.  Female:   Pap- Hysterectomy.      Mammo-  Last 03/19/15:  BI-RADS CATEGORY 3: Probably benign Dexa scan- Pt cannot recall last. Order placed today.  CCS- Last 05/27/16. Polyps removed.    Objective:    Today's Vitals   01/26/17 0819  BP: 120/72  Pulse: 76  Resp: 16  Temp: 97.9 F (36.6 C)  TempSrc: Oral  SpO2: 97%  Weight: 287 lb 12.8 oz (130.5 kg)  Height: 5' 3"  (1.6 m)   Body mass index is 50.98 kg/m.   Current Medications (verified) Outpatient Encounter Prescriptions as of 01/26/2017  Medication Sig  . Ascorbic Acid (VITAMIN C) 1000 MG tablet Take 1,000 mg by mouth daily.  .Marland Kitchenaspirin EC 81 MG tablet Take 1 tablet (81 mg total) by mouth daily.  . cyclobenzaprine (FLEXERIL) 10 MG tablet Take 1 tablet (10 mg total) by mouth 3 (three) times daily as needed for muscle spasms.  . furosemide (LASIX) 40 MG tablet Take 1 tablet (40 mg total) by mouth daily.  .Marland Kitchenlovastatin (MEVACOR) 20 MG tablet Take 1 tablet (20 mg total) by mouth at bedtime.  . metFORMIN (GLUCOPHAGE) 500 MG tablet Take 1 tablet (500 mg total) by mouth 2 (two) times daily with a meal.  . traMADol (ULTRAM) 50 MG  tablet Take 50 mg by mouth every 6 (six) hours as needed for moderate pain.  . [DISCONTINUED] furosemide (LASIX) 40 MG tablet Take 1 tablet (40 mg total) by mouth daily.  . [DISCONTINUED] metFORMIN (GLUCOPHAGE) 500 MG tablet Take 1 tablet (500 mg total) by mouth 2 (two) times daily with a meal.  . [DISCONTINUED] Sennosides (EX-LAX PO) Take 1-2 tablets by mouth daily as needed (for constipation).   No facility-administered encounter medications on file as of 01/26/2017.     Allergies (verified) Nsaids   History: Past Medical History:  Diagnosis Date  . Chronic back pain   . Chronic combined systolic and diastolic CHF (congestive heart failure) ([place]    a. Echo (08/02/13):  EF 45-50%, diff HK, Gr 1 DD, MAC, mild LAE, normal RVSF, PASP 36;  b. CM likely related to untreated HTN  . Depression   . Diabetes mellitus without complication ([facility] [facility]   . Hypertension    Past Surgical History:  Procedure Laterality Date  . ABDOMINAL HYSTERECTOMY  11/2011  . CHOLECYSTECTOMY  1988  . COLONOSCOPY WITH PROPOFOL N/A 05/27/2016   Procedure: COLONOSCOPY WITH PROPOFOL;  Surgeon: VWilford Corner MD;  Location: [facility];  Service: Endoscopy;  Laterality: N/A;  . ESOPHAGOGASTRODUODENOSCOPY (EGD) WITH PROPOFOL N/A 05/27/2016   Procedure: ESOPHAGOGASTRODUODENOSCOPY (EGD) WITH PROPOFOL;  Surgeon: VWilford Corner MD;  [facility];  Service: Endoscopy;  Laterality: N/A;  . GASTRIC BYPASS    . SPLENECTOMY, TOTAL  1988   History reviewed. No pertinent family history. Social History   Occupational History  . Not on file.   Social History Main Topics  . Smoking status: Never Smoker  . Smokeless tobacco: Never Used  . Alcohol use No  . Drug use: No  . Sexual activity: No    Tobacco Counseling Counseling given: Not Answered   Activities of Daily Living In your present state of health, do you have any difficulty performing the following activities: 01/26/2017  Hearing? N  Vision? N    Difficulty concentrating or making decisions? N  Walking or climbing stairs? Y  Dressing or bathing? N  Doing errands, shopping? N  Preparing Food and eating ? N  Using the Toilet? N  Do you have problems with loss of bowel control? N  Managing your Medications? N  Managing your Finances? N  Housekeeping or managing your Housekeeping? Y  Some recent data might be hidden    Immunizations and Health Maintenance Immunization History  Administered Date(s) Administered  . Influenza Split 08/11/2016  . Influenza,inj,Quad PF,36+ Mos 11/26/2014  . Pneumococcal Conjugate-13 01/26/2017  . Pneumococcal Polysaccharide-23 08/02/2013, 11/26/2014   Health Maintenance Due  Topic Date Due  . FOOT EXAM  07/01/1958  . TETANUS/TDAP  07/02/1967  . DEXA SCAN  07/01/2013    Patient Care Team: Darreld Mclean, MD as PCP - General (Family Medicine) Drema Dallas, MD as Consulting Physician (Physical Medicine and Rehabilitation)  Indicate any recent Medical Services you may have received from other than [facility] providers in the past year (date may be approximate).     Assessment:   This is a routine wellness examination for [name] [name]. Physical assessment deferred to PCP.   Hearing/Vision screen Hearing Screening Comments: Able to hear conversational tones w/o difficulty. No issues reported. Passed whisper test.  Vision Screening Comments: Pt went 01/12/17 for new onset of floaters. Pt follows up mid march.   Dietary issues and exercise activities discussed: Current Exercise Habits: Home exercise routine, Type of exercise: walking, Frequency (Times/Week): 5, Intensity: Mild, Exercise limited by: orthopedic condition(s) Diet (meal preparation, eat out, water intake, caffeinated beverages, dairy products, fruits and vegetables): 1 bottle water/week. Educated pt regarding importance of water. Eats a few small meals daily. Breakfast: Skips or sweet tea. Sometimes sausage biscuit with  cheese. Lunch: 1/2 sandwich  Dinner:    Production designer, theatre/television/film.   Goals    . Increase water intake    . Walk on treadmill      Depression Screen PHQ 2/9 Scores 01/26/2017 12/04/2013  PHQ - 2 Score 2 4  PHQ- 9 Score 8 25    Fall Risk Fall Risk  01/26/2017 11/11/2016 12/04/2013  Falls in the past year? Yes Yes Yes  Number falls in past yr: 2 or more 1 2 or more  Injury with Fall? No Yes -  Risk Factor Category  High Fall Risk - -  Risk for fall due to : History of fall(s);Impaired mobility - Impaired balance/gait;Impaired mobility;History of fall(s)  Risk for fall due to (comments): back pain - -  Follow up Falls prevention discussed;Education provided - -    Cognitive Function:   Ad8 score reviewed for issues:  Issues making decisions: no  Less interest in hobbies / activities: no  Repeats questions, stories (family complaining): no  Trouble using ordinary gadgets (microwave, computer, phone): no  Forgets  the month or year: no  Mismanaging finances: no  Remembering appts: no  Daily problems with thinking and/or memory: no Ad8 score is= 0       Screening Tests Health Maintenance  Topic Date Due  . FOOT EXAM  07/01/1958  . TETANUS/TDAP  07/02/1967  . DEXA SCAN  07/01/2013  . MAMMOGRAM  03/18/2017  . HEMOGLOBIN A1C  04/16/2017  . URINE MICROALBUMIN  08/12/2017  . OPHTHALMOLOGY EXAM  11/29/2017  . COLONOSCOPY  05/27/2026  . INFLUENZA VACCINE  Completed  . Hepatitis C Screening  Completed  . PNA vac Low Risk Adult  Completed      Plan:    Bring a copy of your advance directives to your next office visit.  During the course of the visit, Kaniya was educated and counseled about the following appropriate screening and preventive services:   Vaccines to include Pneumoccal, Influenza, Hepatitis B, Td, Zostavax, HCV  Cardiovascular disease screening  Colorectal cancer screening  Bone density screening  Diabetes screening  Glaucoma  screening  Mammography/PAP  Nutrition counseling  Smoking cessation counseling  Patient Instructions (the written plan) were given to the patient.    Ree Edman, RN   01/26/2017   I have reviewed the above note by Ms. Kimm Sider and agree with her documentation

## 2017-01-26 ENCOUNTER — Ambulatory Visit (INDEPENDENT_AMBULATORY_CARE_PROVIDER_SITE_OTHER): Payer: Medicare HMO | Admitting: Family Medicine

## 2017-01-26 ENCOUNTER — Encounter: Payer: Self-pay | Admitting: Family Medicine

## 2017-01-26 VITALS — BP 120/72 | HR 76 | Temp 97.9°F | Resp 16 | Ht 63.0 in | Wt 287.8 lb

## 2017-01-26 DIAGNOSIS — E119 Type 2 diabetes mellitus without complications: Secondary | ICD-10-CM | POA: Diagnosis not present

## 2017-01-26 DIAGNOSIS — Z780 Asymptomatic menopausal state: Secondary | ICD-10-CM

## 2017-01-26 DIAGNOSIS — Z5181 Encounter for therapeutic drug level monitoring: Secondary | ICD-10-CM | POA: Diagnosis not present

## 2017-01-26 DIAGNOSIS — E1169 Type 2 diabetes mellitus with other specified complication: Secondary | ICD-10-CM

## 2017-01-26 DIAGNOSIS — I5042 Chronic combined systolic (congestive) and diastolic (congestive) heart failure: Secondary | ICD-10-CM

## 2017-01-26 DIAGNOSIS — Z23 Encounter for immunization: Secondary | ICD-10-CM | POA: Diagnosis not present

## 2017-01-26 DIAGNOSIS — Z0000 Encounter for general adult medical examination without abnormal findings: Secondary | ICD-10-CM

## 2017-01-26 DIAGNOSIS — E785 Hyperlipidemia, unspecified: Secondary | ICD-10-CM | POA: Diagnosis not present

## 2017-01-26 LAB — LIPID PANEL
CHOL/HDL RATIO: 4.3 ratio (ref <5.0)
CHOLESTEROL: 149 mg/dL (ref <200)
HDL: 35 mg/dL — AB (ref >50)
LDL Cholesterol: 82 mg/dL (ref <100)
Triglycerides: 158 mg/dL — ABNORMAL HIGH (ref <150)
VLDL: 32 mg/dL — ABNORMAL HIGH (ref <30)

## 2017-01-26 LAB — COMPREHENSIVE METABOLIC PANEL
ALK PHOS: 106 U/L (ref 39–117)
ALT: 24 U/L (ref 0–35)
AST: 24 U/L (ref 0–37)
Albumin: 3.8 g/dL (ref 3.5–5.2)
BILIRUBIN TOTAL: 0.4 mg/dL (ref 0.2–1.2)
BUN: 10 mg/dL (ref 6–23)
CALCIUM: 9.3 mg/dL (ref 8.4–10.5)
CO2: 29 meq/L (ref 19–32)
CREATININE: 0.77 mg/dL (ref 0.40–1.20)
Chloride: 100 mEq/L (ref 96–112)
GFR: 79.1 mL/min (ref >60.00)
Glucose, Bld: 104 mg/dL — ABNORMAL HIGH (ref 70–99)
Potassium: 4.3 mEq/L (ref 3.5–5.1)
Sodium: 135 mEq/L (ref 135–145)
TOTAL PROTEIN: 7.6 g/dL (ref 6.0–8.3)

## 2017-01-26 LAB — HEMOGLOBIN A1C
HEMOGLOBIN A1C: 6.2 % — AB (ref <5.7)
Mean Plasma Glucose: 131 mg/dL

## 2017-01-26 MED ORDER — FUROSEMIDE 40 MG PO TABS: 40.0000 mg | ORAL_TABLET | Freq: Every day | ORAL | 3 refills | Status: DC

## 2017-01-26 MED ORDER — METFORMIN HCL 500 MG PO TABS: 500.0000 mg | ORAL_TABLET | Freq: Two times a day (BID) | ORAL | 3 refills | Status: DC

## 2017-01-26 NOTE — Patient Instructions (Addendum)
I would recommend that you see an audiologist to have your hearing tested formally [facility] is a good office in town but there are several options.   We will get your labs for you today and I will be in touch with these results  Please do complete your living will/ healthcare POA and bring [place] a copy when you have time Also, please do let us know the name and dosage of your thyroid med when you have time

## 2017-01-26 NOTE — Progress Notes (Signed)
[facility] [address], [place], [place] [address] [phone]  Date:  01/26/2017   Name:  Kathleen Miller   DOB:  [birth year]   MRN:  [id number]  PCP:  Lamar Blinks, MD    Chief Complaint: Medicare Wellness; abdominal mass (chronic per pt); and Patient Education (Pt question about thyroid lab results. )   History of Present Illness:  Kathleen Miller is a 71 y.o. very pleasant female patient who presents with the following:  Here today to follow-up on controlled Diabetes & Hyperlipidemia.  Started on Metformin & Lovastatin on 08-12-2016.  Lab Results  Component Value Date   HGBA1C 6.2 10/17/2016    Wt Readings from Last 3 Encounters:  01/26/17 287 lb 12.8 oz (130.5 kg)  11/11/16 288 lb 9.6 oz (130.9 kg)  08/12/16 289 lb 6.4 oz (131.3 kg)   Just bought treadmill, has only used it once (for about 2 minutes).  She needs someone to help her get it off incline so she can use it more easily.  Needs to eliminate sweet tea, but is having a hard time doing it.  Patient states she eats small 4-5 small meals.  She states that she doesn't eat that much  Last eye appt:  Saw Dr Gershon Crane in the last 2 weeks, has a follow-up appt in March, 2018. She is having some acute floaters which he is helping her with   States that she has a few heart palpitations, every other day, that she thinks is related to her back.   These are long standing, no CP, last for just a moment or so. No SOB. States that a doctor told her to cough and it would clear.  Denies myalgias.  Has gotten dizzy a few times in the last several months, but can not provide any details.  Has only eaten mints today, so is essentially fasting.  Going to have dental surgery coming up.  Concern that her hearing is fading- would like to have evaluation for same   Wt Readings from Last 3 Encounters:  01/26/17 287 lb 12.8 oz (130.5 kg)  11/11/16 288 lb 9.6 oz (130.9  kg)  08/12/16 289 lb 6.4 oz (131.3 kg)      Patient Active Problem List   Diagnosis Date Noted  . Chronic pain syndrome 07/18/2016  . Dyslipidemia 07/12/2016  . LUQ pain 05/27/2016  . GERD (gastroesophageal reflux disease) 05/27/2016  . Encounter for screening mammogram for breast cancer 11/26/2014  . Slow transit constipation 06/06/2014  . Diabetes ([place]) 12/04/2013  . Chronic combined systolic and diastolic heart failure ([place]) 12/04/2013  . Essential hypertension, benign 12/04/2013  . Spinal stenosis of lumbar region 12/04/2013  . Morbid obesity ([place]) 08/04/2013  . Acute combined systolic and diastolic congestive heart failure ([place]) 08/02/2013  . Type II or unspecified type diabetes mellitus without mention of complication, uncontrolled 08/02/2013    Past Medical History:  Diagnosis Date  . Chronic back pain   . Chronic combined systolic and diastolic CHF (congestive heart failure) ([place])    a. Echo (08/02/13):  EF 45-50%, diff HK, Gr 1 DD, MAC, mild LAE, normal RVSF, PASP 36;  b. CM likely related to untreated HTN  . Depression   . Diabetes mellitus without complication ([place])   . Hypertension     Past Surgical History:  Procedure Laterality Date  . ABDOMINAL HYSTERECTOMY  11/2011  . CHOLECYSTECTOMY  1988  . COLONOSCOPY WITH  PROPOFOL N/A 05/27/2016   Procedure: COLONOSCOPY WITH PROPOFOL;  Surgeon: Wilford Corner, MD;  Location: [facility];  Service: Endoscopy;  Laterality: N/A;  . ESOPHAGOGASTRODUODENOSCOPY (EGD) WITH PROPOFOL N/A 05/27/2016   Procedure: ESOPHAGOGASTRODUODENOSCOPY (EGD) WITH PROPOFOL;  Surgeon: Wilford Corner, MD;  Location: [facility];  Service: Endoscopy;  Laterality: N/A;  . GASTRIC BYPASS    . SPLENECTOMY, TOTAL  1988    Social History  Substance Use Topics  . Smoking status: Never Smoker  . Smokeless tobacco: Never Used  . Alcohol use No    History reviewed. No pertinent family history.  Allergies  Allergen Reactions  . Nsaids Swelling  and Other (See Comments)    Water on the pain     Medication list has been reviewed and updated.  Current Outpatient Prescriptions on File Prior to Visit  Medication Sig Dispense Refill  . Ascorbic Acid (VITAMIN C) 1000 MG tablet Take 1,000 mg by mouth daily.    Marland Kitchen aspirin EC 81 MG tablet Take 1 tablet (81 mg total) by mouth daily. 90 tablet 3  . cyclobenzaprine (FLEXERIL) 10 MG tablet Take 1 tablet (10 mg total) by mouth 3 (three) times daily as needed for muscle spasms. 180 tablet 3  . lovastatin (MEVACOR) 20 MG tablet Take 1 tablet (20 mg total) by mouth at bedtime. 90 tablet 3  . traMADol (ULTRAM) 50 MG tablet Take 50 mg by mouth every 6 (six) hours as needed for moderate pain.     No current facility-administered medications on file prior to visit.     Review of Systems:  Review of Systems  Constitutional: Positive for malaise/fatigue. Negative for chills, fever and weight loss.  Eyes:       Positive for floaters  Respiratory: Negative for cough, shortness of breath and wheezing.   Cardiovascular: Negative for chest pain and palpitations.  Gastrointestinal: Negative for constipation, diarrhea, heartburn, nausea and vomiting.  Musculoskeletal: Positive for back pain. Negative for myalgias.  Neurological: Positive for weakness.  Psychiatric/Behavioral: Negative for depression. The patient is not nervous/anxious.   All other systems reviewed and are negative.    Physical Examination: Vitals:   01/26/17 0819  BP: 120/72  Pulse: 76  Resp: 16  Temp: 97.9 F (36.6 C)   Vitals:   01/26/17 0819  Weight: 287 lb 12.8 oz (130.5 kg)  Height: _0  (1.6 m)   Body mass index is 50.98 kg/m. Ideal Body Weight: Weight in (lb) to have BMI = 25: 140.8  Physical Examination: General appearance - alert, well appearing, and in no distress, oriented to person, place, and time and overweight Mental status - alert, oriented to person, place, and time, normal mood, behavior, speech,  dress, motor activity, and thought processes Eyes - pupils equal and reactive, extraocular eye movements intact Neck - supple, no significant adenopathy Lymphatics - no palpable lymphadenopathy, no hepatosplenomegaly Chest - clear to auscultation, no wheezes, rales or rhonchi, symmetric air entry Heart - normal rate, regular rhythm, normal S1, S2, no murmurs, rubs, clicks or gallops Abdomen - abdominal wall bulge- stable as per previous exam Neurological - alert, oriented, normal speech Musculoskeletal - no joint tenderness, deformity or swelling Extremities - peripheral pulses normal Skin - normal coloration and turgor, no rashes, no suspicious skin lesions noted   Assessment and Plan:  Controlled type 2 diabetes mellitus without complication, without long-term current use of insulin (HCC) - Plan: Hemoglobin A1c, metFORMIN (GLUCOPHAGE) 500 MG tablet, CANCELED: Hemoglobin A1c  Asymptomatic menopausal state - Plan: DG  Bone Density  Hyperlipidemia associated with type 2 diabetes mellitus ([place]) - Plan: Lipid panel  Medication monitoring encounter - Plan: Comprehensive metabolic panel  Chronic combined systolic and diastolic heart failure (HCC) - Plan: furosemide (LASIX) 40 MG tablet  Encounter for Medicare annual wellness exam  Here today for a follow-up eval Will repeat A1c and lipid panel to see how she is responding to medication Ordered dexa scan prevnar 13 vaccine today Her weight is stable to slightly decreased- evidence of normal fluid balance. She will continue her lasix - CMP today She is using a thyroid med but cannot remember the name or dose- she will call with this info Normal TSH a few months ago so will not repeat today  Patient instructions:  I would recommend that you see an audiologist to have your hearing tested formally [facility] is a good office in town but there are several options.   We will get your labs for you today and I will be in touch with these  results  Please do complete your living will/ healthcare POA and bring [place] a copy when you have time Also, please do let us know the name and dosage of your thyroid med when you have time   Signed Lamar Blinks, MD

## 2017-01-28 ENCOUNTER — Encounter: Payer: Self-pay | Admitting: Family Medicine

## 2017-02-07 ENCOUNTER — Telehealth: Payer: Self-pay | Admitting: Family Medicine

## 2017-02-07 ENCOUNTER — Inpatient Hospital Stay: Admission: RE | Admit: 2017-02-07 | Payer: Self-pay | Source: Ambulatory Visit

## 2017-02-07 NOTE — Telephone Encounter (Signed)
Caller name:Marybella Boise Relationship to patient: Can be reached:[phone] Pharmacy:[facility] on [address]  Reason for call: Patient is calling regarding lab work, never heard anything or anything about med refills

## 2017-02-07 NOTE — Telephone Encounter (Signed)
Called and went over her labs- I turned off her mychart account as she is not actually using this.   Will send her a copy of her labs

## 2017-02-10 DIAGNOSIS — H524 Presbyopia: Secondary | ICD-10-CM | POA: Diagnosis not present

## 2017-02-10 DIAGNOSIS — H5213 Myopia, bilateral: Secondary | ICD-10-CM | POA: Diagnosis not present

## 2017-02-10 DIAGNOSIS — E119 Type 2 diabetes mellitus without complications: Secondary | ICD-10-CM | POA: Diagnosis not present

## 2017-02-10 DIAGNOSIS — H2513 Age-related nuclear cataract, bilateral: Secondary | ICD-10-CM | POA: Diagnosis not present

## 2017-02-16 ENCOUNTER — Inpatient Hospital Stay: Admission: RE | Admit: 2017-02-16 | Payer: Self-pay | Source: Ambulatory Visit

## 2017-02-22 ENCOUNTER — Ambulatory Visit (INDEPENDENT_AMBULATORY_CARE_PROVIDER_SITE_OTHER)
Admission: RE | Admit: 2017-02-22 | Discharge: 2017-02-22 | Disposition: A | Discharge status: Home / Self Care | Payer: Medicare HMO | Source: Ambulatory Visit | Attending: Family Medicine | Admitting: Family Medicine

## 2017-02-22 DIAGNOSIS — Z780 Asymptomatic menopausal state: Secondary | ICD-10-CM

## 2017-02-23 ENCOUNTER — Encounter: Payer: Self-pay | Admitting: Family Medicine

## 2017-02-23 DIAGNOSIS — M8580 Other specified disorders of bone density and structure, unspecified site: Secondary | ICD-10-CM | POA: Insufficient documentation

## 2017-04-22 ENCOUNTER — Telehealth: Payer: Self-pay | Admitting: *Deleted

## 2017-04-22 NOTE — Telephone Encounter (Signed)
AWV scheduled 04/27/17 with Hoyle Sauer @830 . PCP appt @9 

## 2017-04-26 NOTE — Progress Notes (Signed)
[place] at [facility] [address] [place], [place] [address] [phone]  Date:  04/27/2017   Name:  Kathleen Miller   DOB:  [birth year]   MRN:  [id number]  PCP:  Darreld Mclean, MD    Chief Complaint: Abdominal Pain (c/o extreme pain in abdomen x 5 weeks. Flexeril is the only thing that helps some. )   History of Present Illness:  Kathleen Miller is a 71 y.o. very pleasant female patient who presents with the following:  Last seen here in February at which time we did follow-up labs for her.  She has a history of abd wall bulge from prior operations which tends to concern her a lot- she is convinced that she has some sort of cancer despite several normal CT scans   HPI from visit in February-  Here today to follow-up on controlled Diabetes & Hyperlipidemia.  Started on Metformin & Lovastatin on 08-12-2016.     Office Visit on 01/26/2017  Component Date Value Ref Range Status  . Cholesterol 01/26/2017 149  <200 mg/dL Final  . Triglycerides 01/26/2017 158* <150 mg/dL Final  . HDL 01/26/2017 35* >50 mg/dL Final  . Total CHOL/HDL Ratio 01/26/2017 4.3  <5.0 Ratio Final  . VLDL 01/26/2017 32* <30 mg/dL Final  . LDL Cholesterol 01/26/2017 82  <100 mg/dL Final  . Hgb A1c MFr Bld 01/26/2017 6.2* <5.7 % Final   Comment:   For someone without known diabetes, a hemoglobin A1c value between 5.7% and 6.4% is consistent with prediabetes and should be confirmed with a follow-up test.   For someone with known diabetes, a value <7% indicates that their diabetes is well controlled. A1c targets should be individualized based on duration of diabetes, age, co-morbid conditions and other considerations.   This assay result is consistent with an increased risk of diabetes.   Currently, no consensus exists regarding use of hemoglobin A1c for diagnosis of diabetes in children.     . Mean Plasma Glucose 01/26/2017 131  mg/dL Final   . Sodium 01/26/2017 135  135 - 145 mEq/L Final  . Potassium 01/26/2017 4.3  3.5 - 5.1 mEq/L Final  . Chloride 01/26/2017 100  96 - 112 mEq/L Final  . CO2 01/26/2017 29  19 - 32 mEq/L Final  . Glucose, Bld 01/26/2017 104* 70 - 99 mg/dL Final  . BUN 01/26/2017 10  6 - 23 mg/dL Final  . Creatinine, Ser 01/26/2017 0.77  0.40 - 1.20 mg/dL Final  . Total Bilirubin 01/26/2017 0.4  0.2 - 1.2 mg/dL Final  . Alkaline Phosphatase 01/26/2017 106  39 - 117 U/L Final  . AST 01/26/2017 24  0 - 37 U/L Final  . ALT 01/26/2017 24  0 - 35 U/L Final  . Total Protein 01/26/2017 7.6  6.0 - 8.3 g/dL Final  . Albumin 01/26/2017 3.8  3.5 - 5.2 g/dL Final  . Calcium 01/26/2017 9.3  8.4 - 10.5 mg/dL Final  . GFR 01/26/2017 79.10  >60.00 mL/min Final   Due for a foot exam, tetanus, mammogram Lab Results  Component Value Date   HGBA1C 6.2 (H) 01/26/2017   Here today with complaint of  1. Needing a derm referral for brown spots on her face and a mole that she would like evaluated  2. Also asks for a referral to a cardiologist due to chest pain since 2015.   She notes "a pinching feeling that makes you go '  ooh' under my left breast." she thinks that she did see cardiology while she was in the hospital in 2014.  Does not remember what they said about her evaluation.  Per DC summary on epic she was noted to have acute combines systolic and diastolic CHF.  Last echo in 2014- shows mild reduced EF and impaired diastolic relaxation.  The chest pains are not related to activity. May last up to one minute.  She may notice these pains every other day.   She did have an EKG last year, and an echo in 2014.   3. Needs refills of her lovastatin and metformin today 4.  States that she "would not have made it through the last 5 weeks if not for the flexeril that you gave me." States that she has been having pain for the last 5 weeks, off an on.   Describes a very severe abd pain than has occurred 1-2 days per week over the last  several weeks.  When she has this severe pain the lump in her belly (incisional hernia?) will feel hard and swollen.  Pain was so severe that she considered going to the hospital but did not end up seeking any care. States that she called last week to try and get an appt with me, but she was told that we were full and she was not offered any other option.  States that she was "crying on the phone" to my staff and was not helped, but there is no note in the computer so I do not know who she may have spoken with or what happened.    She is not having this pain today and currently feels fine  Also notes that she has lost some weight-  When she is having the pains she will be "unable to eat and I have to force myself to drink" Wt Readings from Last 3 Encounters:  04/27/17 280 lb 6.4 oz (127.2 kg)  01/26/17 287 lb 12.8 oz (130.5 kg)  11/11/16 288 lb 9.6 oz (130.9 kg)   She feels that using ex-lax and then having a stool does seem to help reduce her pain No vomiting but she has noted some nausea She has not noted any fever   Patient Active Problem List   Diagnosis Date Noted  . Low bone mass 02/23/2017  . Chronic pain syndrome 07/18/2016  . Dyslipidemia 07/12/2016  . LUQ pain 05/27/2016  . GERD (gastroesophageal reflux disease) 05/27/2016  . Encounter for screening mammogram for breast cancer 11/26/2014  . Slow transit constipation 06/06/2014  . Diabetes ([place]) 12/04/2013  . Chronic combined systolic and diastolic heart failure ([place]) 12/04/2013  . Essential hypertension, benign 12/04/2013  . Spinal stenosis of lumbar region 12/04/2013  . Morbid obesity ([place]) 08/04/2013  . Acute combined systolic and diastolic congestive heart failure ([place]) 08/02/2013  . Type II or unspecified type diabetes mellitus without mention of complication, uncontrolled 08/02/2013    Past Medical History:  Diagnosis Date  . Chronic back pain   . Chronic combined systolic and diastolic CHF (congestive heart  failure) ([place])    a. Echo (08/02/13):  EF 45-50%, diff HK, Gr 1 DD, MAC, mild LAE, normal RVSF, PASP 36;  b. CM likely related to untreated HTN  . Depression   . Diabetes mellitus without complication ([place])   . Hypertension     Past Surgical History:  Procedure Laterality Date  . ABDOMINAL HYSTERECTOMY  11/2011  . CHOLECYSTECTOMY  1988  . COLONOSCOPY WITH PROPOFOL  N/A 05/27/2016   Procedure: COLONOSCOPY WITH PROPOFOL;  Surgeon: Wilford Corner, MD;  Location: [facility];  Service: Endoscopy;  Laterality: N/A;  . ESOPHAGOGASTRODUODENOSCOPY (EGD) WITH PROPOFOL N/A 05/27/2016   Procedure: ESOPHAGOGASTRODUODENOSCOPY (EGD) WITH PROPOFOL;  Surgeon: Wilford Corner, MD;  Location: [facility];  Service: Endoscopy;  Laterality: N/A;  . GASTRIC BYPASS    . SPLENECTOMY, TOTAL  1988    Social History  Substance Use Topics  . Smoking status: Never Smoker  . Smokeless tobacco: Never Used  . Alcohol use No    No family history on file.  Allergies  Allergen Reactions  . Nsaids Swelling and Other (See Comments)    Water on the pain     Medication list has been reviewed and updated.  Current Outpatient Prescriptions on File Prior to Visit  Medication Sig Dispense Refill  . Ascorbic Acid (VITAMIN C) 1000 MG tablet Take 1,000 mg by mouth daily.    Marland Kitchen aspirin EC 81 MG tablet Take 1 tablet (81 mg total) by mouth daily. 90 tablet 3  . cyclobenzaprine (FLEXERIL) 10 MG tablet Take 1 tablet (10 mg total) by mouth 3 (three) times daily as needed for muscle spasms. 180 tablet 3  . furosemide (LASIX) 40 MG tablet Take 1 tablet (40 mg total) by mouth daily. 90 tablet 3  . lovastatin (MEVACOR) 20 MG tablet Take 1 tablet (20 mg total) by mouth at bedtime. 90 tablet 3  . metFORMIN (GLUCOPHAGE) 500 MG tablet Take 1 tablet (500 mg total) by mouth 2 (two) times daily with a meal. 180 tablet 3  . traMADol (ULTRAM) 50 MG tablet Take 50 mg by mouth every 6 (six) hours as needed for moderate pain.     No  current facility-administered medications on file prior to visit.     Review of Systems:  As per HPI- otherwise negative. No fever No rash No ST, Cough, earache Never a smoker   Physical Examination: Vitals:   04/27/17 0907  BP: 126/74  Pulse: 79  Temp: 98.4 F (36.9 C)   Vitals:   04/27/17 0907  Weight: 280 lb 6.4 oz (127.2 kg)  Height: _0  (1.6 m)   Body mass index is 49.67 kg/m. Ideal Body Weight: Weight in (lb) to have BMI = 25: 140.8  GEN: WDWN, NAD, Non-toxic, A & O x 3, obese, appears her usual self HEENT: Atraumatic, Normocephalic. Neck supple. No masses, No LAD.  Bilateral TM wnl, oropharynx normal.  PEERL,EOMI.   Ears and Nose: No external deformity. CV: RRR, No M/G/R. No JVD. No thrill. No extra heart sounds. PULM: CTA B, no wheezes, crackles, rhonchi. No retractions. No resp. distress. No accessory muscle use. ABD: S, NT, ND, +BS. No rebound. No HSM.  She has old healed surgical scars and a firm mass in the left upper quad which is baseline, presume due to an incisional hernia EXTR: No c/c/e NEURO Normal gait.  PSYCH: Normally interactive. Conversant. Not depressed or anxious appearing.  Calm demeanor.   LJQ:GBEEFEOF to EKG done in June 2017 No significant change noted- SR with old q waves in V1 and V2  Dg Abd 2 Views  Result Date: 04/27/2017 CLINICAL DATA:  Intermittent periumbilical pain for the past 5 weeks. Episodes of nausea, constipation, and diarrhea. EXAM: ABDOMEN - 2 VIEW COMPARISON:  Abdominal and pelvic CT scan of May 10, 2016 FINDINGS: The bowel gas pattern is within the limits of normal. There surgical mid suture material in the left upper quadrant in the  region of the GE junction a as well is to the left of the mid lumbar spine. There are no abnormal soft tissue calcifications. There are pelvic phleboliths. There are degenerative disc changes at multiple lumbar levels with chronic levocurvature. IMPRESSION: No acute intra-abdominal abnormality  is observed. The colonic stool burden is mildly increased and may reflect constipation in the appropriate clinical setting. There is no evidence of a small or large bowel obstruction. Electronically Signed   By: David  Martinique M.D.   On: 04/27/2017 11:26     Assessment and Plan: Periumbilical abdominal pain - Plan: DG Abd 2 Views, Ambulatory referral to General Surgery  Chest pain, unspecified type - Plan: Ambulatory referral to Cardiology, EKG 12-Lead  [place] - Plan: Ambulatory referral to Dermatology  Controlled type 2 diabetes mellitus without complication, without long-term current use of insulin ([place]) - Plan: Hemoglobin A1c  Morbid obesity, unspecified obesity type ([place]) - Plan: Comprehensive metabolic panel, Hemoglobin A1c  Dyslipidemia  Abdominal wall bulge - Plan: CBC, Ambulatory referral to General Surgery  Chronic diastolic heart failure ([place]) - Plan: Ambulatory referral to Cardiology  Here today with multiple concerns She has noted CP for several years.  Her pain is atypical, but she does have a history of CHF and no current cardiology care so will refer to cardiology for evaluation  She may be having an intermittently incarcerated hernia causing her abd pain. Referral to general surgery.  Advised that if she is having severe pain she should always seek care, even if she is not able to get an appt at my office.  This may mean going to the ER-  She states understanding She has refills of her requested meds on file- she will ask pharmacy to fill these Referral to derm per her request  Results for orders placed or performed in visit on 04/27/17  Comprehensive metabolic panel  Result Value Ref Range   Sodium 136 135 - 145 mEq/L   Potassium 3.9 3.5 - 5.1 mEq/L   Chloride 102 96 - 112 mEq/L   CO2 30 19 - 32 mEq/L   Glucose, Bld 114 (H) 70 - 99 mg/dL   BUN 8 6 - 23 mg/dL   Creatinine, Ser 0.85 0.40 - 1.20 mg/dL   Total Bilirubin 0.4 0.2 - 1.2 mg/dL   Alkaline Phosphatase 108  39 - 117 U/L   AST 19 0 - 37 U/L   ALT 20 0 - 35 U/L   Total Protein 7.6 6.0 - 8.3 g/dL   Albumin 3.9 3.5 - 5.2 g/dL   Calcium 9.5 8.4 - 10.5 mg/dL   GFR 70.52 >60.00 mL/min  CBC  Result Value Ref Range   WBC 8.9 4.0 - 10.5 K/uL   RBC 4.86 3.87 - 5.11 Mil/uL   Platelets 460.0 (H) 150.0 - 400.0 K/uL   Hemoglobin 12.7 12.0 - 15.0 g/dL   HCT 39.7 36.0 - 46.0 %   MCV 81.7 78.0 - 100.0 fl   MCHC 32.0 30.0 - 36.0 g/dL   RDW 14.8 11.5 - 15.5 %  Hemoglobin A1c  Result Value Ref Range   Hgb A1c MFr Bld 6.6 (H) 4.6 - 6.5 %     Signed Lamar Blinks, MD

## 2017-04-27 ENCOUNTER — Ambulatory Visit (HOSPITAL_BASED_OUTPATIENT_CLINIC_OR_DEPARTMENT_OTHER)
Admission: RE | Admit: 2017-04-27 | Discharge: 2017-04-27 | Disposition: A | Discharge status: Home / Self Care | Payer: Medicare HMO | Source: Ambulatory Visit | Attending: Family Medicine | Admitting: Family Medicine

## 2017-04-27 ENCOUNTER — Ambulatory Visit (INDEPENDENT_AMBULATORY_CARE_PROVIDER_SITE_OTHER): Payer: Medicare HMO | Admitting: Family Medicine

## 2017-04-27 VITALS — BP 126/74 | HR 79 | Temp 98.4°F | Ht 63.0 in | Wt 280.4 lb

## 2017-04-27 DIAGNOSIS — D229 Melanocytic nevi, unspecified: Secondary | ICD-10-CM

## 2017-04-27 DIAGNOSIS — E785 Hyperlipidemia, unspecified: Secondary | ICD-10-CM | POA: Diagnosis not present

## 2017-04-27 DIAGNOSIS — I5032 Chronic diastolic (congestive) heart failure: Secondary | ICD-10-CM | POA: Diagnosis not present

## 2017-04-27 DIAGNOSIS — R1033 Periumbilical pain: Secondary | ICD-10-CM

## 2017-04-27 DIAGNOSIS — R079 Chest pain, unspecified: Secondary | ICD-10-CM

## 2017-04-27 DIAGNOSIS — E119 Type 2 diabetes mellitus without complications: Secondary | ICD-10-CM | POA: Diagnosis not present

## 2017-04-27 DIAGNOSIS — K5900 Constipation, unspecified: Secondary | ICD-10-CM | POA: Diagnosis not present

## 2017-04-27 DIAGNOSIS — R1900 Intra-abdominal and pelvic swelling, mass and lump, unspecified site: Secondary | ICD-10-CM | POA: Diagnosis not present

## 2017-04-27 LAB — CBC
HEMATOCRIT: 39.7 % (ref 36.0–46.0)
HEMOGLOBIN: 12.7 g/dL (ref 12.0–15.0)
MCHC: 32 g/dL (ref 30.0–36.0)
MCV: 81.7 fl (ref 78.0–100.0)
PLATELETS: 460 10*3/uL — AB (ref 150.0–400.0)
RBC: 4.86 Mil/uL (ref 3.87–5.11)
RDW: 14.8 % (ref 11.5–15.5)
WBC: 8.9 10*3/uL (ref 4.0–10.5)

## 2017-04-27 LAB — COMPREHENSIVE METABOLIC PANEL
ALT: 20 U/L (ref 0–35)
AST: 19 U/L (ref 0–37)
Albumin: 3.9 g/dL (ref 3.5–5.2)
Alkaline Phosphatase: 108 U/L (ref 39–117)
BUN: 8 mg/dL (ref 6–23)
CO2: 30 meq/L (ref 19–32)
Calcium: 9.5 mg/dL (ref 8.4–10.5)
Chloride: 102 mEq/L (ref 96–112)
Creatinine, Ser: 0.85 mg/dL (ref 0.40–1.20)
GFR: 70.52 mL/min (ref >60.00)
GLUCOSE: 114 mg/dL — AB (ref 70–99)
POTASSIUM: 3.9 meq/L (ref 3.5–5.1)
SODIUM: 136 meq/L (ref 135–145)
TOTAL PROTEIN: 7.6 g/dL (ref 6.0–8.3)
Total Bilirubin: 0.4 mg/dL (ref 0.2–1.2)

## 2017-04-27 LAB — HEMOGLOBIN A1C: Hgb A1c MFr Bld: 6.6 % — ABNORMAL HIGH (ref 4.6–6.5)

## 2017-04-27 NOTE — Patient Instructions (Signed)
Please go to the lab, and then to the ground floor to have x-rays of your belly I am going to refer you see a cardiologist, general surgeon and also to dermatology I suspect that your belly pain is due to an incisional hernia from prior operations However, if you have any other episodes of severe abdominal pain- or of chest pain that is different or more prolonged than prior pains- please seek emergency care

## 2017-04-28 ENCOUNTER — Encounter: Payer: Self-pay | Admitting: Family Medicine

## 2017-05-15 NOTE — Progress Notes (Signed)
Cardiology Office Note   Date:  05/18/2017   ID:  Kathleen Miller, DOB May 22, 1946, MRN [id number]  PCP:  Darreld Mclean, MD  Cardiologist:   Jenkins Rouge, MD   No chief complaint on file.     History of Present Illness: Kathleen Miller is a 71 y.o. female who presents for consultation of chest pain. Referred by Dr Edilia Bo.  CRF;s DM and HL. Started on metformin and lovastatin 08/12/16.  Indicates SSCP since 2015.  Pinching feeling under her left breast. History of low normal EF and ? diastolic CHF 2409 EF 73-53% Grade on diastolic mild LAE echo reviewed. Estimated PA 36 mmHg.  Notes Dr Mare Ferrari reviewed atypical chest pain BNP 202. Labile BP with salt indiscretion Felt to be poor candidate for myovue given body habitus Rx medically.  R/O no ECG changes troponin negative. D/C by Dr Marlou Porch with recommendation weight loss and fluid restriction   Dwells on accident in 2012 and issues with allergy to NSAI's She has limited mobility due to back pain but has never had surgery. Still describes pinching under left breast and some dyspnea with exertion     Past Medical History:  Diagnosis Date  . Chronic back pain   . Chronic combined systolic and diastolic CHF (congestive heart failure) ([place])    a. Echo (08/02/13):  EF 45-50%, diff HK, Gr 1 DD, MAC, mild LAE, normal RVSF, PASP 36;  b. CM likely related to untreated HTN  . Depression   . Diabetes mellitus without complication ([place])   . Hypertension     Past Surgical History:  Procedure Laterality Date  . ABDOMINAL HYSTERECTOMY  11/2011  . CHOLECYSTECTOMY  1988  . COLONOSCOPY WITH PROPOFOL N/A 05/27/2016   Procedure: COLONOSCOPY WITH PROPOFOL;  Surgeon: Wilford Corner, MD;  Location: [facility];  Service: Endoscopy;  Laterality: N/A;  . ESOPHAGOGASTRODUODENOSCOPY (EGD) WITH PROPOFOL N/A 05/27/2016   Procedure: ESOPHAGOGASTRODUODENOSCOPY (EGD) WITH PROPOFOL;  Surgeon: Wilford Corner, MD;  Location: [facility];  Service:  Endoscopy;  Laterality: N/A;  . GASTRIC BYPASS    . SPLENECTOMY, TOTAL  1988     Current Outpatient Prescriptions  Medication Sig Dispense Refill  . Ascorbic Acid (VITAMIN C) 1000 MG tablet Take 1,000 mg by mouth daily.    Marland Kitchen aspirin EC 81 MG tablet Take 1 tablet (81 mg total) by mouth daily. 90 tablet 3  . cyclobenzaprine (FLEXERIL) 10 MG tablet Take 1 tablet (10 mg total) by mouth 3 (three) times daily as needed for muscle spasms. 180 tablet 3  . furosemide (LASIX) 40 MG tablet Take 1 tablet (40 mg total) by mouth daily. 90 tablet 3  . lovastatin (MEVACOR) 20 MG tablet Take 1 tablet (20 mg total) by mouth at bedtime. 90 tablet 3  . metFORMIN (GLUCOPHAGE) 500 MG tablet Take 1 tablet (500 mg total) by mouth 2 (two) times daily with a meal. 180 tablet 3  . traMADol (ULTRAM) 50 MG tablet Take 50 mg by mouth every 6 (six) hours as needed for moderate pain.     No current facility-administered medications for this visit.     Allergies:   Nsaids and Tolmetin    Social History:  The patient  reports that she has never smoked. She has never used smokeless tobacco. She reports that she does not drink alcohol or use drugs.   Family History:  The patient's  Family history HTN both parents   ROS:  Please see the history of present illness.  Otherwise, review of systems are positive for none.   All other systems are reviewed and negative.    PHYSICAL EXAM: VS:  BP 126/78   Pulse 75   Ht _0  (1.626 m)   Wt 123.4 kg (272 lb)   SpO2 97%   BMI 46.69 kg/m  , BMI Body mass index is 46.69 kg/m. Affect appropriate Obese female  HEENT: normal Neck supple with no adenopathy JVP normal no bruits no thyromegaly Lungs clear with no wheezing and good diaphragmatic motion Heart:  S1/S2 no murmur, no rub, gallop or click PMI normal Abdomen: benighn, BS positve, no tenderness, no AAA no bruit.  No HSM or HJR Distal pulses intact with no bruits Plus one bilateral edema Neuro non-focal Skin  warm and dry No muscular weakness    EKG:  04/27/17 SR poor R wave progression   Recent Labs: 10/17/2016: TSH 2.31 04/27/2017: ALT 20; BUN 8; Creatinine, Ser 0.85; Hemoglobin 12.7; Platelets 460.0; Potassium 3.9; Sodium 136    Lipid Panel    Component Value Date/Time   CHOL 149 01/26/2017 0949   TRIG 158 (H) 01/26/2017 0949   HDL 35 (L) 01/26/2017 0949   CHOLHDL 4.3 01/26/2017 0949   VLDL 32 (H) 01/26/2017 0949   LDLCALC 82 01/26/2017 0949   LDLDIRECT 124.0 07/12/2016 1024      Wt Readings from Last 3 Encounters:  05/18/17 123.4 kg (272 lb)  04/27/17 127.2 kg (280 lb 6.4 oz)  01/26/17 130.5 kg (287 lb 12.8 oz)      Other studies Reviewed: Additional studies/ records that were reviewed today include: Cardiology notes 2014 Notes primary [place] Echo , CXR 2014  Recent labs .    ASSESSMENT AND PLAN:  1. Chest Pain:  Atypical recurrent unable to walk f/u lexiscan myovue  2. Diastolic CHF recheck echo suspect just obesity and being sedentary continue laisx  3. DM:  Discussed low carb diet.  Target hemoglobin A1c is 6.5 or less.  Continue current medications. 4. HL:  On statin LDL under 100 LFTls normal 5. Labile BP:  Lasix and salt restriction   Current medicines are reviewed at length with the patient today.  The patient does not have concerns regarding medicines.  The following changes have been made:  no change  Labs/ tests ordered today include: Lexiscan myovue and Echo  No orders of the defined types were placed in this encounter.    Disposition:   FU with primary      Signed, Jenkins Rouge, MD  05/18/2017 8:22 AM    [facility] [place], [place], [place]  [address] Phone: [phone]; Fax: [phone]

## 2017-05-18 ENCOUNTER — Ambulatory Visit (INDEPENDENT_AMBULATORY_CARE_PROVIDER_SITE_OTHER): Payer: Medicare HMO | Admitting: Cardiovascular Disease

## 2017-05-18 ENCOUNTER — Encounter: Payer: Self-pay | Admitting: Cardiovascular Disease

## 2017-05-18 VITALS — BP 126/78 | HR 75 | Ht 64.0 in | Wt 272.0 lb

## 2017-05-18 DIAGNOSIS — R0789 Other chest pain: Secondary | ICD-10-CM | POA: Diagnosis not present

## 2017-05-18 DIAGNOSIS — R0602 Shortness of breath: Secondary | ICD-10-CM | POA: Diagnosis not present

## 2017-05-18 NOTE — Patient Instructions (Addendum)
Medication Instructions:  Your physician recommends that you continue on your current medications as directed. Please refer to the Current Medication list given to you today.  Labwork: NONE  Testing/Procedures: Your physician has requested that you have a lexiscan myoview. For further information please visit [email or web address]. Please follow instruction sheet, as given.  Your physician has requested that you have an echocardiogram. Echocardiography is a painless test that uses sound waves to create images of your heart. It provides your doctor with information about the size and shape of your heart and how well your heart's chambers and valves are working. This procedure takes approximately one hour. There are no restrictions for this procedure.  Follow-Up: Your physician wants you to follow-up as needed with Dr. Nishan.  If you need a refill on your cardiac medications before your next appointment, please call your pharmacy.    

## 2017-05-19 ENCOUNTER — Telehealth: Payer: Self-pay

## 2017-05-19 NOTE — Telephone Encounter (Signed)
Patient is on the list for [employer] 2018 and may be a good candidate for an AWV. Please let me know if/when appt is scheduled.   

## 2017-05-20 NOTE — Telephone Encounter (Signed)
Patient completed AWV on 01/26/2017.

## 2017-05-31 ENCOUNTER — Ambulatory Visit (HOSPITAL_COMMUNITY): Payer: Self-pay

## 2017-06-02 ENCOUNTER — Ambulatory Visit (HOSPITAL_COMMUNITY): Payer: Self-pay

## 2017-06-02 ENCOUNTER — Other Ambulatory Visit (HOSPITAL_COMMUNITY): Payer: Self-pay

## 2017-06-07 ENCOUNTER — Ambulatory Visit (HOSPITAL_COMMUNITY): Payer: Self-pay

## 2017-06-08 DIAGNOSIS — Z9884 Bariatric surgery status: Secondary | ICD-10-CM | POA: Diagnosis not present

## 2017-06-08 DIAGNOSIS — K432 Incisional hernia without obstruction or gangrene: Secondary | ICD-10-CM | POA: Diagnosis not present

## 2017-06-09 ENCOUNTER — Other Ambulatory Visit: Payer: Self-pay | Admitting: Surgery

## 2017-06-27 ENCOUNTER — Ambulatory Visit
Admission: RE | Admit: 2017-06-27 | Discharge: 2017-06-27 | Disposition: A | Discharge status: Home / Self Care | Payer: Medicare HMO | Source: Ambulatory Visit | Attending: Surgery | Admitting: Surgery

## 2017-06-27 ENCOUNTER — Other Ambulatory Visit: Payer: Self-pay | Admitting: Surgery

## 2017-07-06 DIAGNOSIS — K432 Incisional hernia without obstruction or gangrene: Secondary | ICD-10-CM | POA: Diagnosis not present

## 2017-07-18 ENCOUNTER — Telehealth (HOSPITAL_COMMUNITY): Payer: Self-pay | Admitting: *Deleted

## 2017-07-18 NOTE — Telephone Encounter (Signed)
Patient given detailed instructions per Myocardial Perfusion Study Information Sheet for the test on  07/18/17. Patient notified to arrive 15 minutes early and that it is imperative to arrive on time for appointment to keep from having the test rescheduled.  If you need to cancel or reschedule your appointment, please call the office within 24 hours of your appointment. . Patient verbalized understanding. Kathleen Miller

## 2017-07-20 ENCOUNTER — Ambulatory Visit (HOSPITAL_BASED_OUTPATIENT_CLINIC_OR_DEPARTMENT_OTHER): Payer: Medicare HMO

## 2017-07-20 ENCOUNTER — Other Ambulatory Visit: Payer: Self-pay

## 2017-07-20 ENCOUNTER — Ambulatory Visit (HOSPITAL_COMMUNITY): Payer: Medicare HMO | Attending: Cardiology

## 2017-07-20 DIAGNOSIS — I110 Hypertensive heart disease with heart failure: Secondary | ICD-10-CM | POA: Insufficient documentation

## 2017-07-20 DIAGNOSIS — R079 Chest pain, unspecified: Secondary | ICD-10-CM | POA: Insufficient documentation

## 2017-07-20 DIAGNOSIS — I351 Nonrheumatic aortic (valve) insufficiency: Secondary | ICD-10-CM | POA: Insufficient documentation

## 2017-07-20 DIAGNOSIS — E119 Type 2 diabetes mellitus without complications: Secondary | ICD-10-CM | POA: Diagnosis not present

## 2017-07-20 DIAGNOSIS — R0602 Shortness of breath: Secondary | ICD-10-CM | POA: Diagnosis not present

## 2017-07-20 DIAGNOSIS — I2589 Other forms of chronic ischemic heart disease: Secondary | ICD-10-CM | POA: Insufficient documentation

## 2017-07-20 DIAGNOSIS — I509 Heart failure, unspecified: Secondary | ICD-10-CM | POA: Insufficient documentation

## 2017-07-20 DIAGNOSIS — R0789 Other chest pain: Secondary | ICD-10-CM

## 2017-07-20 LAB — ECHOCARDIOGRAM COMPLETE
HEIGHTINCHES: 64 in
Weight: 4352 oz

## 2017-07-20 MED ORDER — REGADENOSON 0.4 MG/5ML IV SOLN
0.4000 mg | Freq: Once | INTRAVENOUS | Status: AC
  Administered 2017-07-20: 0.4 mg via INTRAVENOUS

## 2017-07-20 MED ORDER — TECHNETIUM TC 99M TETROFOSMIN IV KIT
32.9000 | PACK | Freq: Once | INTRAVENOUS | Status: AC | PRN
  Administered 2017-07-20: 32.9 via INTRAVENOUS
  Filled 2017-07-20: qty 33

## 2017-07-21 ENCOUNTER — Other Ambulatory Visit (HOSPITAL_COMMUNITY): Payer: Self-pay

## 2017-07-21 ENCOUNTER — Ambulatory Visit (HOSPITAL_COMMUNITY): Payer: Medicare HMO | Attending: Internal Medicine

## 2017-07-21 LAB — MYOCARDIAL PERFUSION IMAGING
CHL CUP NUCLEAR SDS: 4
CHL CUP NUCLEAR SSS: 18
CHL CUP RESTING HR STRESS: 70 {beats}/min
CSEPPHR: 89 {beats}/min
LVDIAVOL: 113 mL (ref 46–106)
LVSYSVOL: 48 mL
RATE: 0.29
SRS: 14
TID: 0.9

## 2017-07-21 MED ORDER — TECHNETIUM TC 99M TETROFOSMIN IV KIT
31.6000 | PACK | Freq: Once | INTRAVENOUS | Status: AC | PRN
  Administered 2017-07-21: 31.6 via INTRAVENOUS
  Filled 2017-07-21: qty 32

## 2017-07-22 ENCOUNTER — Telehealth: Payer: Self-pay

## 2017-07-22 DIAGNOSIS — R0602 Shortness of breath: Secondary | ICD-10-CM

## 2017-07-22 DIAGNOSIS — R9439 Abnormal result of other cardiovascular function study: Secondary | ICD-10-CM

## 2017-07-22 NOTE — Telephone Encounter (Signed)
Patient having heart cath on 08/05/17.

## 2017-07-22 NOTE — Telephone Encounter (Signed)
-----   Message from Michaelyn Barter, RN sent at 07/22/2017 10:20 AM EDT ----- Left message for patient to call back.

## 2017-07-25 DIAGNOSIS — L820 Inflamed seborrheic keratosis: Secondary | ICD-10-CM | POA: Diagnosis not present

## 2017-07-25 DIAGNOSIS — L918 Other hypertrophic disorders of the skin: Secondary | ICD-10-CM | POA: Diagnosis not present

## 2017-07-25 DIAGNOSIS — L821 Other seborrheic keratosis: Secondary | ICD-10-CM | POA: Diagnosis not present

## 2017-07-25 DIAGNOSIS — L814 Other melanin hyperpigmentation: Secondary | ICD-10-CM | POA: Diagnosis not present

## 2017-07-29 ENCOUNTER — Other Ambulatory Visit: Payer: Medicare HMO | Admitting: *Deleted

## 2017-07-29 DIAGNOSIS — R9439 Abnormal result of other cardiovascular function study: Secondary | ICD-10-CM

## 2017-07-29 DIAGNOSIS — R0602 Shortness of breath: Secondary | ICD-10-CM

## 2017-07-29 LAB — PROTIME-INR
INR: 1 (ref 0.8–1.2)
Prothrombin Time: 10.3 s (ref 9.1–12.0)

## 2017-07-29 LAB — CBC WITH DIFFERENTIAL/PLATELET
BASOS ABS: 0.2 10*3/uL (ref 0.0–0.2)
Basos: 2 %
EOS (ABSOLUTE): 0.4 10*3/uL (ref 0.0–0.4)
EOS: 5 %
HEMATOCRIT: 38.2 % (ref 34.0–46.6)
HEMOGLOBIN: 12.3 g/dL (ref 11.1–15.9)
IMMATURE GRANS (ABS): 0 10*3/uL (ref 0.0–0.1)
IMMATURE GRANULOCYTES: 0 %
LYMPHS: 23 %
Lymphocytes Absolute: 2 10*3/uL (ref 0.7–3.1)
MCH: 25.7 pg — ABNORMAL LOW (ref 26.6–33.0)
MCHC: 32.2 g/dL (ref 31.5–35.7)
MCV: 80 fL (ref 79–97)
MONOCYTES: 9 %
Monocytes Absolute: 0.8 10*3/uL (ref 0.1–0.9)
NEUTROS PCT: 61 %
Neutrophils Absolute: 5.3 10*3/uL (ref 1.4–7.0)
Platelets: 488 10*3/uL — ABNORMAL HIGH (ref 150–379)
RBC: 4.79 x10E6/uL (ref 3.77–5.28)
RDW: 14.6 % (ref 12.3–15.4)
WBC: 8.6 10*3/uL (ref 3.4–10.8)

## 2017-07-29 LAB — BASIC METABOLIC PANEL
BUN / CREAT RATIO: 12 (ref 12–28)
BUN: 10 mg/dL (ref 8–27)
CALCIUM: 9.4 mg/dL (ref 8.7–10.3)
CHLORIDE: 100 mmol/L (ref 96–106)
CO2: 22 mmol/L (ref 20–29)
CREATININE: 0.84 mg/dL (ref 0.57–1.00)
GFR calc Af Amer: 82 mL/min/{1.73_m2} (ref >59)
GFR calc non Af Amer: 71 mL/min/{1.73_m2} (ref >59)
GLUCOSE: 139 mg/dL — AB (ref 65–99)
Potassium: 4.6 mmol/L (ref 3.5–5.2)
Sodium: 139 mmol/L (ref 134–144)

## 2017-08-02 ENCOUNTER — Telehealth: Payer: Self-pay | Admitting: Cardiovascular Disease

## 2017-08-02 NOTE — Telephone Encounter (Signed)
Left message for patient to call back  

## 2017-08-02 NOTE — Telephone Encounter (Signed)
New Message ° ° pt verbalized that she is returning call for rn °

## 2017-08-03 ENCOUNTER — Telehealth: Payer: Self-pay

## 2017-08-03 NOTE — H&P (Signed)
Physician History and Physical     Patient ID: Kathleen Miller MRN: [id number] DOB/AGE: 69-Jan-1947 69 y.o. Admit date: (Not on file)  Primary Care Physician: Darreld Mclean, MD Primary Cardiologist: Johnsie Cancel  Active Problems:   * No active hospital problems. *   HPI:   Kathleen Miller is a 71 y.o. female seen initially  consultation of chest pain. 05/18/17  Referred by Dr Edilia Bo.  CRF;s DM and HL. Started on metformin and lovastatin 08/12/16.  Indicates SSCP since 2015.  Pinching feeling under her left breast. History of low normal EF and ? diastolic CHF 6295 EF 28-41% Grade on diastolic mild LAE echo reviewed. Estimated PA 36 mmHg.  Notes Dr Mare Ferrari reviewed atypical chest pain BNP 202. Labile BP with salt indiscretion Felt to be poor candidate for myovue given body habitus Rx medically.  R/O no ECG changes troponin negative. D/C by Dr Marlou Porch with recommendation weight loss and fluid restriction   Dwells on accident in 2012 and issues with allergy to NSAI's She has limited mobility due to back pain but has never had surgery. Still describes pinching under left breast and some dyspnea with exertion   07/20/17 Echo : EF 50-55% mild AR EF slightly improved from echo done 2014  07/21/17 myovue abnormal  Study Highlights     The left ventricular ejection fraction is normal (55-65%).  Nuclear stress EF: 57%.  There was no ST segment deviation noted during stress.  Findings consistent with prior myocardial infarction with peri-infarct ischemia.  This is an intermediate risk study.   1. EF 57% with normal wall motion.  2. Large, partially reversible moderate intensity mid anterolateral/anterior/anteroseptal and apical anterior/septal perfusion defect.  This suggests infarction with significant peri-infarction ischemia.  However, cannot totally rule out artifact given totally normal wall motion.   Intermediate risk study.    Given patient size did not feel like coronary CTA  would yield diagnostic result Set up for cath with Dr Martinique 08/05/17   Review of systems complete and found to be negative unless listed above   Past Medical History:  Diagnosis Date  . Chronic back pain   . Chronic combined systolic and diastolic CHF (congestive heart failure) ([place])    a. Echo (08/02/13):  EF 45-50%, diff HK, Gr 1 DD, MAC, mild LAE, normal RVSF, PASP 36;  b. CM likely related to untreated HTN  . Depression   . Diabetes mellitus without complication ([place])   . Hypertension     No family history on file.  Social History   Social History  . Marital status: Divorced    Spouse name: N/A  . Number of children: N/A  . Years of education: N/A   Occupational History  . Not on file.   Social History Main Topics  . Smoking status: Never Smoker  . Smokeless tobacco: Never Used  . Alcohol use No  . Drug use: No  . Sexual activity: No   Other Topics Concern  . Not on file   Social History Narrative  . No narrative on file    Past Surgical History:  Procedure Laterality Date  . ABDOMINAL HYSTERECTOMY  11/2011  . CHOLECYSTECTOMY  1988  . COLONOSCOPY WITH PROPOFOL N/A 05/27/2016   Procedure: COLONOSCOPY WITH PROPOFOL;  Surgeon: Wilford Corner, MD;  Location: [facility];  Service: Endoscopy;  Laterality: N/A;  . ESOPHAGOGASTRODUODENOSCOPY (EGD) WITH PROPOFOL N/A 05/27/2016   Procedure: ESOPHAGOGASTRODUODENOSCOPY (EGD) WITH PROPOFOL;  Surgeon: Wilford Corner, MD;  Location: [facility];  Service: Endoscopy;  Laterality: N/A;  . GASTRIC BYPASS    . SPLENECTOMY, TOTAL  1988     No prescriptions prior to admission.    Physical Exam:  BP 130/80 mmHg HR 75 Ht 5' 4'' Weight 272 lbs   Affect appropriate Obese female  HEENT: normal Neck supple with no adenopathy JVP normal no bruits no thyromegaly Lungs clear with no wheezing and good diaphragmatic motion Heart:  S1/S2 no murmur, no rub, gallop or click PMI normal Abdomen: benighn, BS positve, no tenderness,  no AAA no bruit.  No HSM or HJR Distal pulses intact with no bruits No edema Neuro non-focal Skin warm and dry No muscular weakness  No current facility-administered medications on file prior to encounter.    Current Outpatient Prescriptions on File Prior to Encounter  Medication Sig Dispense Refill  . Ascorbic Acid (VITAMIN C) 1000 MG tablet Take 1,000 mg by mouth daily.    Marland Kitchen aspirin EC 81 MG tablet Take 1 tablet (81 mg total) by mouth daily. 90 tablet 3  . cyclobenzaprine (FLEXERIL) 10 MG tablet Take 1 tablet (10 mg total) by mouth 3 (three) times daily as needed for muscle spasms. 180 tablet 3  . furosemide (LASIX) 40 MG tablet Take 1 tablet (40 mg total) by mouth daily. 90 tablet 3  . lovastatin (MEVACOR) 20 MG tablet Take 1 tablet (20 mg total) by mouth at bedtime. 90 tablet 3  . metFORMIN (GLUCOPHAGE) 500 MG tablet Take 1 tablet (500 mg total) by mouth 2 (two) times daily with a meal. 180 tablet 3  . traMADol (ULTRAM) 50 MG tablet Take 50 mg by mouth every 6 (six) hours as needed for moderate pain.      Labs:   Lab Results  Component Value Date   WBC 8.6 07/29/2017   HGB 12.3 07/29/2017   HCT 38.2 07/29/2017   MCV 80 07/29/2017   PLT 488 (H) 07/29/2017    Recent Labs Lab 07/29/17 0823  NA 139  K 4.6  CL 100  CO2 22  BUN 10  CREATININE 0.84  CALCIUM 9.4  GLUCOSE 139*   Lab Results  Component Value Date   TROPONINI <0.03 05/10/2016   TROPONINI <0.03 05/10/2016   TROPONINI <0.30 08/02/2013     Lab Results  Component Value Date   CHOL 149 01/26/2017   CHOL 166 10/17/2016   CHOL 198 07/12/2016   Lab Results  Component Value Date   HDL 35 (L) 01/26/2017   HDL 36 10/17/2016   HDL 37.00 (L) 07/12/2016   Lab Results  Component Value Date   LDLCALC 82 01/26/2017   LDLCALC 107 10/17/2016   LDLCALC 126 (H) 08/02/2013   Lab Results  Component Value Date   TRIG 158 (H) 01/26/2017   TRIG 214 (A) 10/17/2016   TRIG 247.0 (H) 07/12/2016   Lab Results    Component Value Date   CHOLHDL 4.3 01/26/2017   CHOLHDL 5 07/12/2016   CHOLHDL 5.0 08/02/2013   Lab Results  Component Value Date   LDLDIRECT 124.0 07/12/2016       Radiology: No results found.  EKG: NSR poor R wave progression   ASSESSMENT AND PLAN:  Chest pain: with CRF;s DM, elevated lipids and HTN. Abnormal myovue cath with Dr Martinique 08/05/17   DM:  Hold glucophage day before and morning of cath  Chol:  Continue statin   Signed: Collier Salina Nishan9/03/2017, 3:28 PM

## 2017-08-03 NOTE — Telephone Encounter (Signed)
Detailed message left per DPR.  Patient contacted pre-catheterization at [facility] scheduled for:  08/05/2017 @ 1030 Verified arrival time and place:  NT @ 0800 Confirmed AM meds to be taken pre-cath with sip of water: Take ASA Hold Lasix day of procedure Hold Metformin 24 hours prior to procedure.  Last dose 08/03/2017 evening dose.  Will hold 48 hours post procedure Confirmed patient has responsible person to drive home post procedure and observe patient for 24 hours:  yes Addl concerns:  Call placed to Dr. Johnsie Cancel to confirm if he wants PCI if need arises.  Will alert cath lab.

## 2017-08-05 ENCOUNTER — Encounter (HOSPITAL_COMMUNITY)
Admission: RE | Disposition: A | Discharge status: Home / Self Care | Payer: Self-pay | Source: Ambulatory Visit | Attending: Cardiology

## 2017-08-05 ENCOUNTER — Encounter (HOSPITAL_COMMUNITY): Payer: Self-pay | Admitting: Cardiology

## 2017-08-05 ENCOUNTER — Ambulatory Visit (HOSPITAL_COMMUNITY)
Admission: RE | Admit: 2017-08-05 | Discharge: 2017-08-05 | Disposition: A | Discharge status: Home / Self Care | Payer: Medicare HMO | Source: Ambulatory Visit | Attending: Cardiology | Admitting: Cardiology

## 2017-08-05 DIAGNOSIS — N189 Chronic kidney disease, unspecified: Secondary | ICD-10-CM | POA: Diagnosis not present

## 2017-08-05 DIAGNOSIS — E119 Type 2 diabetes mellitus without complications: Secondary | ICD-10-CM

## 2017-08-05 DIAGNOSIS — I5042 Chronic combined systolic (congestive) and diastolic (congestive) heart failure: Secondary | ICD-10-CM | POA: Diagnosis not present

## 2017-08-05 DIAGNOSIS — I130 Hypertensive heart and chronic kidney disease with heart failure and stage 1 through stage 4 chronic kidney disease, or unspecified chronic kidney disease: Secondary | ICD-10-CM | POA: Diagnosis not present

## 2017-08-05 DIAGNOSIS — M549 Dorsalgia, unspecified: Secondary | ICD-10-CM | POA: Insufficient documentation

## 2017-08-05 DIAGNOSIS — F329 Major depressive disorder, single episode, unspecified: Secondary | ICD-10-CM | POA: Insufficient documentation

## 2017-08-05 DIAGNOSIS — E1122 Type 2 diabetes mellitus with diabetic chronic kidney disease: Secondary | ICD-10-CM | POA: Diagnosis not present

## 2017-08-05 DIAGNOSIS — E785 Hyperlipidemia, unspecified: Secondary | ICD-10-CM | POA: Diagnosis present

## 2017-08-05 DIAGNOSIS — R9439 Abnormal result of other cardiovascular function study: Secondary | ICD-10-CM | POA: Diagnosis present

## 2017-08-05 DIAGNOSIS — R079 Chest pain, unspecified: Secondary | ICD-10-CM | POA: Diagnosis not present

## 2017-08-05 DIAGNOSIS — I10 Essential (primary) hypertension: Secondary | ICD-10-CM | POA: Diagnosis present

## 2017-08-05 DIAGNOSIS — R69 Illness, unspecified: Secondary | ICD-10-CM | POA: Diagnosis not present

## 2017-08-05 DIAGNOSIS — Z9884 Bariatric surgery status: Secondary | ICD-10-CM | POA: Insufficient documentation

## 2017-08-05 DIAGNOSIS — Z9049 Acquired absence of other specified parts of digestive tract: Secondary | ICD-10-CM | POA: Insufficient documentation

## 2017-08-05 DIAGNOSIS — Z9081 Acquired absence of spleen: Secondary | ICD-10-CM | POA: Insufficient documentation

## 2017-08-05 DIAGNOSIS — G8929 Other chronic pain: Secondary | ICD-10-CM | POA: Insufficient documentation

## 2017-08-05 DIAGNOSIS — Z90710 Acquired absence of both cervix and uterus: Secondary | ICD-10-CM | POA: Diagnosis not present

## 2017-08-05 HISTORY — PX: LEFT HEART CATH AND CORONARY ANGIOGRAPHY: CATH118249

## 2017-08-05 LAB — GLUCOSE, CAPILLARY: GLUCOSE-CAPILLARY: 108 mg/dL — AB (ref 65–99)

## 2017-08-05 SURGERY — LEFT HEART CATH AND CORONARY ANGIOGRAPHY
Anesthesia: LOCAL

## 2017-08-05 MED ORDER — ASPIRIN 81 MG PO CHEW
CHEWABLE_TABLET | ORAL | Status: AC
  Filled 2017-08-05: qty 1

## 2017-08-05 MED ORDER — HEPARIN (PORCINE) IN NACL 2-0.9 UNIT/ML-% IJ SOLN
INTRAMUSCULAR | Status: AC
  Filled 2017-08-05: qty 1000

## 2017-08-05 MED ORDER — MIDAZOLAM HCL 2 MG/2ML IJ SOLN
INTRAMUSCULAR | Status: AC
  Filled 2017-08-05: qty 2

## 2017-08-05 MED ORDER — SODIUM CHLORIDE 0.9 % IV SOLN: 250.0000 mL | INTRAVENOUS | Status: DC | PRN

## 2017-08-05 MED ORDER — TRAMADOL HCL 50 MG PO TABS
ORAL_TABLET | ORAL | Status: AC
  Filled 2017-08-05: qty 1

## 2017-08-05 MED ORDER — ASPIRIN 81 MG PO CHEW
81.0000 mg | CHEWABLE_TABLET | ORAL | Status: AC
  Administered 2017-08-05: 81 mg via ORAL

## 2017-08-05 MED ORDER — HEPARIN (PORCINE) IN NACL 2-0.9 UNIT/ML-% IJ SOLN
INTRAMUSCULAR | Status: DC | PRN
  Administered 2017-08-05: 10:00:00

## 2017-08-05 MED ORDER — HEPARIN SODIUM (PORCINE) 1000 UNIT/ML IJ SOLN
INTRAMUSCULAR | Status: AC
  Filled 2017-08-05: qty 1

## 2017-08-05 MED ORDER — SODIUM CHLORIDE 0.9% FLUSH: 3.0000 mL | INTRAVENOUS | Status: DC | PRN

## 2017-08-05 MED ORDER — SODIUM CHLORIDE 0.9 % WEIGHT BASED INFUSION
3.0000 mL/kg/h | INTRAVENOUS | Status: AC
  Administered 2017-08-05: 3 mL/kg/h via INTRAVENOUS

## 2017-08-05 MED ORDER — SODIUM CHLORIDE 0.9% FLUSH: 3.0000 mL | Freq: Two times a day (BID) | INTRAVENOUS | Status: DC

## 2017-08-05 MED ORDER — LIDOCAINE HCL (PF) 1 % IJ SOLN
INTRAMUSCULAR | Status: AC
  Filled 2017-08-05: qty 30

## 2017-08-05 MED ORDER — METFORMIN HCL 500 MG PO TABS
500.0000 mg | ORAL_TABLET | Freq: Two times a day (BID) | ORAL | Status: DC
  Filled 2017-08-05: qty 1

## 2017-08-05 MED ORDER — IOPAMIDOL (ISOVUE-370) INJECTION 76%
INTRAVENOUS | Status: AC
  Filled 2017-08-05: qty 100

## 2017-08-05 MED ORDER — VERAPAMIL HCL 2.5 MG/ML IV SOLN
INTRAVENOUS | Status: DC | PRN
  Administered 2017-08-05: 10 mL via INTRA_ARTERIAL

## 2017-08-05 MED ORDER — LIDOCAINE HCL (PF) 1 % IJ SOLN
INTRAMUSCULAR | Status: DC | PRN
Start: 2017-08-05 — End: 2017-08-05
  Administered 2017-08-05: 2 mL

## 2017-08-05 MED ORDER — IOPAMIDOL (ISOVUE-370) INJECTION 76%
INTRAVENOUS | Status: DC | PRN
  Administered 2017-08-05: 65 mL

## 2017-08-05 MED ORDER — SODIUM CHLORIDE 0.9 % WEIGHT BASED INFUSION: 1.0000 mL/kg/h | INTRAVENOUS | Status: DC

## 2017-08-05 MED ORDER — MIDAZOLAM HCL 2 MG/2ML IJ SOLN
INTRAMUSCULAR | Status: DC | PRN
Start: 2017-08-05 — End: 2017-08-05
  Administered 2017-08-05: 1 mg via INTRAVENOUS

## 2017-08-05 MED ORDER — METFORMIN HCL 500 MG PO TABS: 500.0000 mg | ORAL_TABLET | Freq: Two times a day (BID) | ORAL | 3 refills | Status: DC

## 2017-08-05 MED ORDER — SODIUM CHLORIDE 0.9 % WEIGHT BASED INFUSION: 1.0000 mL/kg/h | INTRAVENOUS | Status: AC

## 2017-08-05 MED ORDER — VERAPAMIL HCL 2.5 MG/ML IV SOLN
INTRAVENOUS | Status: AC
  Filled 2017-08-05: qty 2

## 2017-08-05 MED ORDER — FENTANYL CITRATE (PF) 100 MCG/2ML IJ SOLN
INTRAMUSCULAR | Status: AC
  Filled 2017-08-05: qty 2

## 2017-08-05 MED ORDER — FENTANYL CITRATE (PF) 100 MCG/2ML IJ SOLN
INTRAMUSCULAR | Status: DC | PRN
  Administered 2017-08-05: 25 ug via INTRAVENOUS

## 2017-08-05 MED ORDER — TRAMADOL HCL 50 MG PO TABS: 50.0000 mg | ORAL_TABLET | Freq: Once | ORAL | Status: DC

## 2017-08-05 MED ORDER — HEPARIN SODIUM (PORCINE) 1000 UNIT/ML IJ SOLN
INTRAMUSCULAR | Status: DC | PRN
  Administered 2017-08-05: 6000 [IU] via INTRAVENOUS

## 2017-08-05 SURGICAL SUPPLY — 10 items

## 2017-08-05 NOTE — Telephone Encounter (Signed)
See phone note from 08/03/17.

## 2017-08-05 NOTE — Progress Notes (Signed)
Pt will not sit back in chair/ abdomen and back pain are better if sitting up.  She States "Do Not Lay Me Back", pts pulse started elevating and pressure dropping,diapheretic, pale, bp 63/42,  fluids were given and  She allowed me to pull her legs up but not put her back down. Lyndsy PA for Dr Martinique was called and arrived for assessment, pt responded to fluids, no further orders.

## 2017-08-05 NOTE — Discharge Instructions (Signed)

## 2017-08-05 NOTE — Progress Notes (Signed)
Dr Martinique was contacted to discuss back and abdomen pain. Pt was allowed to take her own flexaril from home and ultram was ordered, pt took home med and now waiting to take ultram.

## 2017-08-05 NOTE — Interval H&P Note (Signed)
History and Physical Interval Note:  08/05/2017 10:04 AM  [name]  has presented today for surgery, with the diagnosis of abnormal stress test  The various methods of treatment have been discussed with the patient and family. After consideration of risks, benefits and other options for treatment, the patient has consented to  Procedure(s): LEFT HEART CATH AND CORONARY ANGIOGRAPHY (N/A) as a surgical intervention .  The patient's history has been reviewed, patient examined, no change in status, stable for surgery.  I have reviewed the patient's chart and labs.  Questions were answered to the patient's satisfaction.   Cath Lab Visit (complete for each Cath Lab visit)  Clinical Evaluation Leading to the Procedure:   ACS: No.  Non-ACS:    Anginal Classification: CCS II  Anti-ischemic medical therapy: No Therapy  Non-Invasive Test Results: Intermediate-risk stress test findings: cardiac mortality 1-3%/year  Prior CABG: No previous CABG        Collier Salina [facility] 08/05/2017 10:05 AM

## 2017-08-30 ENCOUNTER — Telehealth: Payer: Self-pay | Admitting: Family Medicine

## 2017-08-30 DIAGNOSIS — Z1239 Encounter for other screening for malignant neoplasm of breast: Secondary | ICD-10-CM

## 2017-08-30 NOTE — Telephone Encounter (Signed)
Pt request mammogram order for a facility in [place] as long as it is NOT at a [facility] hospital. Call pt [phone].

## 2017-09-13 ENCOUNTER — Other Ambulatory Visit: Payer: Self-pay | Admitting: Family Medicine

## 2017-09-13 DIAGNOSIS — N6002 Solitary cyst of left breast: Secondary | ICD-10-CM

## 2017-09-20 ENCOUNTER — Ambulatory Visit: Payer: Medicare HMO

## 2017-09-20 ENCOUNTER — Ambulatory Visit
Admission: RE | Admit: 2017-09-20 | Discharge: 2017-09-20 | Disposition: A | Discharge status: Home / Self Care | Payer: Medicare HMO | Source: Ambulatory Visit | Attending: Family Medicine | Admitting: Family Medicine

## 2017-09-20 DIAGNOSIS — R928 Other abnormal and inconclusive findings on diagnostic imaging of breast: Secondary | ICD-10-CM | POA: Diagnosis not present

## 2017-09-20 DIAGNOSIS — N6002 Solitary cyst of left breast: Secondary | ICD-10-CM

## 2017-09-22 ENCOUNTER — Telehealth: Payer: Self-pay | Admitting: *Deleted

## 2017-09-22 NOTE — Telephone Encounter (Signed)
   [facility] Pre-operative Risk Assessment    Request for surgical clearance:  1. What type of surgery is being performed? Gastric bypass revision and hernia repair   2. When is this surgery scheduled? In the near future   3. Are there any medications that need to be held prior to surgery and how long? None noted in faxed request   4. Practice name and name of physician performing surgery? [facility], Dr. Marland Kitchen [place]   5. What is your office phone and fax number? Phone: [phone]; Fax: [phone] attn Jan   6. Anesthesia type (None, local, MAC, general) ? General Ellouise Newer, Caren Hazy 09/22/2017, 2:11 PM  _________________________________________________________________   (provider comments below)

## 2017-09-23 NOTE — Telephone Encounter (Signed)
Patient recently underwent cath for atypical chest pain. She has normal coronaries. I tried to reach the patient via phone, but unsuccessful. I left her a message to call us. As long as she is doing well after recent cath and no obvious complications, she can be cleared.

## 2017-09-26 NOTE — Telephone Encounter (Signed)
   Chart reviewed as part of pre-operative protocol coverage.   Patient contacted by phone. She is doing well post cath, with no new cardiac issues or concerns. Her respiratory status is at baseline. She feels that she is okay for the surgery. No further cardiac workup is needed.  Given past medical history and time since last visit, based on ACC/AHA guidelines, Kathleen Miller would be at acceptable risk for the planned procedure without further cardiovascular testing.   Rosaria Ferries, PA-C 09/26/2017, 2:25 PM

## 2017-09-26 NOTE — Telephone Encounter (Signed)
Per Pre-Op Protocol Pt has been cleared for her surgery. I will fax clearance over to the surgeon's office, [facility] Dr. Excell Seltzer. Fax # [phone]

## 2017-10-06 DIAGNOSIS — Y832 Surgical operation with anastomosis, bypass or graft as the cause of abnormal reaction of the patient, or of later complication, without mention of misadventure at the time of the procedure: Secondary | ICD-10-CM | POA: Diagnosis not present

## 2017-10-06 DIAGNOSIS — K9189 Other postprocedural complications and disorders of digestive system: Secondary | ICD-10-CM | POA: Diagnosis not present

## 2017-10-06 DIAGNOSIS — K432 Incisional hernia without obstruction or gangrene: Secondary | ICD-10-CM | POA: Diagnosis not present

## 2017-10-07 ENCOUNTER — Ambulatory Visit: Payer: Self-pay | Admitting: General Surgery

## 2017-11-02 NOTE — Patient Instructions (Signed)
Kathleen Miller  11/02/2017   Your procedure is scheduled on: 11-07-17   Report to [facility] Main  Entrance Take [name] Elevators to 3rd floor to [place] at 5:30 AM.   Call this number if you have problems the morning of surgery [phone]    Remember: ONLY 1 PERSON MAY GO WITH YOU TO SHORT STAY TO GET  READY MORNING OF [name]  Do not eat food or drink liquids :After Midnight.     Take these medicines the morning of surgery with A SIP OF WATER: Thyroid (NP Thyroid)   DO NOT TAKE ANY DIABETIC MEDICATIONS DAY OF YOUR SURGERY                               You may not have any metal on your body including hair pins and              piercings  Do not wear jewelry, make-up, lotions, powders or perfumes, deodorant             Do not wear nail polish.  Do not shave  48 hours prior to surgery.             Do not bring valuables to the hospital. [place].  Contacts, dentures or bridgework may not be worn into surgery.  Leave suitcase in the car. After surgery it may be brought to your room.                Please read over the following fact sheets you were given: _____________________________________________________________________ How to Manage Your Diabetes Before and After Surgery  Why is it important to control my blood sugar before and after surgery? . Improving blood sugar levels before and after surgery helps healing and can limit problems. . A way of improving blood sugar control is eating a healthy diet by: o  Eating less sugar and carbohydrates o  Increasing activity/exercise o  Talking with your doctor about reaching your blood sugar goals . High blood sugars (greater than 180 mg/dL) can raise your risk of infections and slow your recovery, so you will need to focus on controlling your diabetes during the weeks before surgery. . Make sure that the doctor who takes care of your  diabetes knows about your planned surgery including the date and location.  How do I manage my blood sugar before surgery? . Check your blood sugar at least 4 times a day, starting 2 days before surgery, to make sure that the level is not too high or low. o Check your blood sugar the morning of your surgery when you wake up and every 2 hours until you get to the Short Stay unit. . If your blood sugar is less than 70 mg/dL, you will need to treat for low blood sugar: o Do not take insulin. o Treat a low blood sugar (less than 70 mg/dL) with  cup of clear juice (cranberry or apple), 4 glucose tablets, OR glucose gel. o Recheck blood sugar in 15 minutes after treatment (to make sure it is greater than 70 mg/dL). If your blood sugar is not greater than 70 mg/dL on recheck, call [phone] for further instructions. . Report your blood sugar to the  short stay nurse when you get to Short Stay.  . If you are admitted to the hospital after surgery: o Your blood sugar will be checked by the staff and you will probably be given insulin after surgery (instead of oral diabetes medicines) to make sure you have good blood sugar levels. o The goal for blood sugar control after surgery is 80-180 mg/dL.   WHAT DO I DO ABOUT MY DIABETES MEDICATION?  Marland Kitchen Do not take oral diabetes medicines (pills) the morning of surgery.  . THE DAY BEFORE SURGERY, take your usual dose of Metformin.       Patient Signature:  Date:   Nurse Signature:  Date:   Reviewed and Endorsed by [facility] Patient Education Committee, August 2015            [facility] - Preparing for Surgery Before surgery, you can play an important role.  Because skin is not sterile, your skin needs to be as free of germs as possible.  You can reduce the number of germs on your skin by washing with CHG (chlorahexidine gluconate) soap before surgery.  CHG is an antiseptic cleaner which kills germs and bonds with the skin to continue killing germs  even after washing. Please DO NOT use if you have an allergy to CHG or antibacterial soaps.  If your skin becomes reddened/irritated stop using the CHG and inform your nurse when you arrive at Short Stay. Do not shave (including legs and underarms) for at least 48 hours prior to the first CHG shower.  You may shave your face/neck. Please follow these instructions carefully:  1.  Shower with CHG Soap the night before surgery and the  morning of Surgery.  2.  If you choose to wash your hair, wash your hair first as usual with your  normal  shampoo.  3.  After you shampoo, rinse your hair and body thoroughly to remove the  shampoo.                           4.  Use CHG as you would any other liquid soap.  You can apply chg directly  to the skin and wash                       Gently with a scrungie or clean washcloth.  5.  Apply the CHG Soap to your body ONLY FROM THE NECK DOWN.   Do not use on face/ open                           Wound or open sores. Avoid contact with eyes, ears mouth and genitals (private parts).                       Wash face,  Genitals (private parts) with your normal soap.             6.  Wash thoroughly, paying special attention to the area where your surgery  will be performed.  7.  Thoroughly rinse your body with warm water from the neck down.  8.  DO NOT shower/wash with your normal soap after using and rinsing off  the CHG Soap.                9.  Pat yourself dry with a clean towel.  10.  Wear clean pajamas.            11.  Place clean sheets on your bed the night of your first shower and do not  sleep with pets. Day of Surgery : Do not apply any lotions/deodorants the morning of surgery.  Please wear clean clothes to the hospital/surgery center.  FAILURE TO FOLLOW THESE INSTRUCTIONS MAY RESULT IN THE CANCELLATION OF YOUR SURGERY PATIENT SIGNATURE_________________________________  NURSE  SIGNATURE__________________________________  ________________________________________________________________________

## 2017-11-02 NOTE — Progress Notes (Signed)
09-22-17 (Epic) Cardiac clearance from Rosaria Ferries, PAC via telephone encounter,  08-05-17 (Epic) EKG  07-21-17 (Epic) Stress Test  07-20-17 (Epic) ECHO

## 2017-11-04 ENCOUNTER — Other Ambulatory Visit: Payer: Self-pay

## 2017-11-04 ENCOUNTER — Encounter (HOSPITAL_COMMUNITY): Payer: Self-pay

## 2017-11-04 ENCOUNTER — Encounter (HOSPITAL_COMMUNITY)
Admission: RE | Admit: 2017-11-04 | Discharge: 2017-11-04 | Disposition: A | Discharge status: Home / Self Care | Payer: Medicare HMO | Source: Ambulatory Visit | Attending: General Surgery | Admitting: General Surgery

## 2017-11-04 DIAGNOSIS — Z01818 Encounter for other preprocedural examination: Secondary | ICD-10-CM | POA: Diagnosis not present

## 2017-11-04 DIAGNOSIS — K432 Incisional hernia without obstruction or gangrene: Secondary | ICD-10-CM | POA: Insufficient documentation

## 2017-11-04 DIAGNOSIS — K316 Fistula of stomach and duodenum: Secondary | ICD-10-CM | POA: Diagnosis not present

## 2017-11-04 DIAGNOSIS — E119 Type 2 diabetes mellitus without complications: Secondary | ICD-10-CM | POA: Diagnosis not present

## 2017-11-04 LAB — CBC
HCT: 39.2 % (ref 36.0–46.0)
Hemoglobin: 12.4 g/dL (ref 12.0–15.0)
MCH: 26.6 pg (ref 26.0–34.0)
MCHC: 31.6 g/dL (ref 30.0–36.0)
MCV: 83.9 fL (ref 78.0–100.0)
PLATELETS: 492 10*3/uL — AB (ref 150–400)
RBC: 4.67 MIL/uL (ref 3.87–5.11)
RDW: 14.9 % (ref 11.5–15.5)
WBC: 8.7 10*3/uL (ref 4.0–10.5)

## 2017-11-04 LAB — BASIC METABOLIC PANEL
ANION GAP: 8 (ref 5–15)
BUN: 12 mg/dL (ref 6–20)
CALCIUM: 9.1 mg/dL (ref 8.9–10.3)
CO2: 27 mmol/L (ref 22–32)
CREATININE: 0.84 mg/dL (ref 0.44–1.00)
Chloride: 102 mmol/L (ref 101–111)
GFR calc Af Amer: >60 mL/min (ref >60)
Glucose, Bld: 110 mg/dL — ABNORMAL HIGH (ref 65–99)
Potassium: 4.2 mmol/L (ref 3.5–5.1)
Sodium: 137 mmol/L (ref 135–145)

## 2017-11-04 LAB — HEMOGLOBIN A1C
HEMOGLOBIN A1C: 6.1 % — AB (ref 4.8–5.6)
MEAN PLASMA GLUCOSE: 128.37 mg/dL

## 2017-11-04 LAB — GLUCOSE, CAPILLARY: Glucose-Capillary: 112 mg/dL — ABNORMAL HIGH (ref 65–99)

## 2017-11-06 ENCOUNTER — Encounter (HOSPITAL_COMMUNITY): Payer: Self-pay | Admitting: Anesthesiology

## 2017-11-06 NOTE — Anesthesia Preprocedure Evaluation (Deleted)
Anesthesia Evaluation  Patient identified by MRN, date of birth, ID band Patient awake    Reviewed: Allergy & Precautions, NPO status , Patient's Chart, lab work & pertinent test results, reviewed documented beta blocker date and time   History of Anesthesia Complications Negative for: history of anesthetic complications  Airway Mallampati: II  TM Distance: >3 FB Neck ROM: Full    Dental  (+) Teeth Intact, Dental Advisory Given   Pulmonary neg pulmonary ROS,    Pulmonary exam normal        Cardiovascular hypertension, Pt. on medications +CHF  Normal cardiovascular exam  Echo (08/02/13): EF 45-50%, diff HK, Gr 1 DD, MAC, mild LAE, normal RVSF, PASP 36; b. CM likely related to untreated HTN   Neuro/Psych PSYCHIATRIC DISORDERS Depression negative neurological ROS     GI/Hepatic negative GI ROS, Neg liver ROS, GERD  ,  Endo/Other  diabetes, Type 2, Oral Hypoglycemic AgentsMorbid obesity  Renal/GU negative Renal ROS     Musculoskeletal   Abdominal   Peds  Hematology   Anesthesia Other Findings lexiscan 8/18   The left ventricular ejection fraction is normal (55-65%).  Nuclear stress EF: 57%.  There was no ST segment deviation noted during stress.  Findings consistent with prior myocardial infarction with peri-infarct ischemia.  This is an intermediate risk study.   1. EF 57% with normal wall motion.  2. Large, partially reversible moderate intensity mid anterolateral/anterior/anteroseptal and apical anterior/septal perfusion defect.  This suggests infarction with significant peri-infarction ischemia.  However, cannot totally rule out artifact given totally normal wall motion.   Intermediate risk study.   Echo 9/18  - Left ventricle: The cavity size was normal. Systolic function was   normal. The estimated ejection fraction was in the range of 50%   to 55%. Wall motion was normal; there were no regional  wall   motion abnormalities. Doppler parameters are consistent with   abnormal left ventricular relaxation (grade 1 diastolic  Reproductive/Obstetrics                             Anesthesia Physical  Anesthesia Plan  ASA: III  Anesthesia Plan: General   Post-op Pain Management:    Induction: Intravenous  PONV Risk Score and Plan: 3 and Treatment may vary due to age or medical condition, Ondansetron and Dexamethasone  Airway Management Planned: Oral ETT  Additional Equipment:   Intra-op Plan:   Post-operative Plan: Extubation in OR  Informed Consent: I have reviewed the patients History and Physical, chart, labs and discussed the procedure including the risks, benefits and alternatives for the proposed anesthesia with the patient or authorized representative who has indicated his/her understanding and acceptance.   Dental advisory given  Plan Discussed with: Anesthesiologist and CRNA  Anesthesia Plan Comments: (No beta blocker for 1 month)        Anesthesia Quick Evaluation

## 2017-11-07 ENCOUNTER — Inpatient Hospital Stay (HOSPITAL_COMMUNITY): Admission: RE | Admit: 2017-11-07 | Payer: Medicare HMO | Source: Ambulatory Visit | Admitting: General Surgery

## 2017-11-07 ENCOUNTER — Encounter (HOSPITAL_COMMUNITY): Admission: RE | Payer: Self-pay | Source: Ambulatory Visit

## 2017-11-07 SURGERY — CREATION, GASTRIC BYPASS, LAPAROSCOPIC, USING ROUX-EN-Y GASTROENTEROSTOMY, WITH HIATAL HERNIA REPAIR
Anesthesia: General

## 2017-12-09 NOTE — Patient Instructions (Signed)
Kathleen Miller  12/09/2017   Your procedure is scheduled on: 12-13-17  Report to [facility] Main  Entrance               Follow signs to Short Stay on first floor at 530 AM  Call this number if you have problems the morning of surgery [phone]    Remember: Do not eat food or drink liquids :After Midnight.     Take these medicines the morning of surgery with A SIP OF WATER: thyroid pill                                You may not have any metal on your body including hair pins and              piercings  Do not wear jewelry, make-up, lotions, powders or perfumes, deodorant             Do not wear nail polish.  Do not shave  48 hours prior to surgery.          Do not bring valuables to the hospital. [place].  Contacts, dentures or bridgework may not be worn into surgery.  Leave suitcase in the car. After surgery it may be brought to your room.                 Please read over the following fact sheets you were given: _____________________________________________________________________           [facility] - Preparing for Surgery Before surgery, you can play an important role.  Because skin is not sterile, your skin needs to be as free of germs as possible.  You can reduce the number of germs on your skin by washing with CHG (chlorahexidine gluconate) soap before surgery.  CHG is an antiseptic cleaner which kills germs and bonds with the skin to continue killing germs even after washing. Please DO NOT use if you have an allergy to CHG or antibacterial soaps.  If your skin becomes reddened/irritated stop using the CHG and inform your nurse when you arrive at Short Stay. Do not shave (including legs and underarms) for at least 48 hours prior to the first CHG shower.  You may shave your face/neck. Please follow these instructions carefully:  1.  Shower with CHG Soap the night before surgery and the   morning of Surgery.  2.  If you choose to wash your hair, wash your hair first as usual with your  normal  shampoo.  3.  After you shampoo, rinse your hair and body thoroughly to remove the  shampoo.                           4.  Use CHG as you would any other liquid soap.  You can apply chg directly  to the skin and wash                       Gently with a scrungie or clean washcloth.  5.  Apply the CHG Soap to your body ONLY FROM THE NECK DOWN.   Do not use on face/ open  Wound or open sores. Avoid contact with eyes, ears mouth and genitals (private parts).                       Wash face,  Genitals (private parts) with your normal soap.             6.  Wash thoroughly, paying special attention to the area where your surgery  will be performed.  7.  Thoroughly rinse your body with warm water from the neck down.  8.  DO NOT shower/wash with your normal soap after using and rinsing off  the CHG Soap.                9.  Pat yourself dry with a clean towel.            10.  Wear clean pajamas.            11.  Place clean sheets on your bed the night of your first shower and do not  sleep with pets. Day of Surgery : Do not apply any lotions/deodorants the morning of surgery.  Please wear clean clothes to the hospital/surgery center.  FAILURE TO FOLLOW THESE INSTRUCTIONS MAY RESULT IN THE CANCELLATION OF YOUR SURGERY PATIENT SIGNATURE_________________________________  NURSE SIGNATURE__________________________________  ________________________________________________________________________

## 2017-12-09 NOTE — Progress Notes (Signed)
Please place orders in epic pt. Has a preop appt 12-12-17 @ 1:00 pm thank you

## 2017-12-11 NOTE — Anesthesia Preprocedure Evaluation (Deleted)
Anesthesia Evaluation    Reviewed: Allergy & Precautions, Patient's Chart, lab work & pertinent test results  Airway        Dental   Pulmonary neg pulmonary ROS,           Cardiovascular hypertension, +CHF    Echo 07/20/17: Study Conclusions  - Left ventricle: The cavity size was normal. Systolic function was normal. The estimated ejection fraction was in the range of 50% to 55%. Wall motion was normal; there were no regional wall motion abnormalities. Doppler parameters are consistent with abnormal left ventricular relaxation (grade 1 diastolic dysfunction). There was no evidence of elevated ventricular filling pressure by Doppler parameters. - Aortic valve: There was mild regurgitation. - Aortic root: The aortic root was normal in size. - Mitral valve: There was no significant regurgitation. - Left atrium: The atrium was at the upper limits of normal in size. - Right ventricle: Systolic function was normal. - Pulmonary arteries: Systolic pressure was within the normal range. - Inferior vena cava: The vessel was normal in size. The   respirophasic diameter changes were in the normal range (= 50%),   consistent with normal central venous pressure. - Pericardium, extracardiac: There was no pericardial effusion.  Impressions:  - Since the prior study on 08/12/2013 LVEF has minimally improved from 45-50% to 50-55%, RVSP is now in the normal range, previously mildly elevated.   Neuro/Psych PSYCHIATRIC DISORDERS Depression negative neurological ROS     GI/Hepatic negative GI ROS, Neg liver ROS, Morbid Obesity, ventral incisional hernia S/p gastric bypass   Endo/Other  diabetes, Type 2, Oral Hypoglycemic AgentsHypothyroidism Morbid obesity  Renal/GU negative Renal ROS     Musculoskeletal negative musculoskeletal ROS (+)   Abdominal   Peds  Hematology negative hematology ROS (+)   Anesthesia Other Findings Day of  surgery medications reviewed with the patient.  Reproductive/Obstetrics                             Anesthesia Physical Anesthesia Plan  ASA: III  Anesthesia Plan: General   Post-op Pain Management:    Induction: Intravenous  PONV Risk Score and Plan: 4 or greater and Midazolam, Dexamethasone and Ondansetron  Airway Management Planned: Oral ETT  Additional Equipment:   Intra-op Plan:   Post-operative Plan: Extubation in OR  Informed Consent:   Plan Discussed with:   Anesthesia Plan Comments:         Anesthesia Quick Evaluation

## 2017-12-12 ENCOUNTER — Encounter (HOSPITAL_COMMUNITY)
Admission: RE | Admit: 2017-12-12 | Discharge: 2017-12-12 | Disposition: A | Discharge status: Home / Self Care | Payer: Medicare HMO | Source: Ambulatory Visit | Attending: General Surgery | Admitting: General Surgery

## 2017-12-12 ENCOUNTER — Encounter (HOSPITAL_COMMUNITY): Payer: Self-pay

## 2017-12-12 ENCOUNTER — Encounter (INDEPENDENT_AMBULATORY_CARE_PROVIDER_SITE_OTHER): Payer: Self-pay

## 2017-12-12 ENCOUNTER — Encounter (HOSPITAL_COMMUNITY)
Admission: RE | Admit: 2017-12-12 | Discharge: 2017-12-12 | Disposition: A | Discharge status: Home / Self Care | Payer: Medicare HMO | Source: Ambulatory Visit

## 2017-12-12 ENCOUNTER — Other Ambulatory Visit: Payer: Self-pay

## 2017-12-12 HISTORY — DX: Prediabetes: R73.03

## 2017-12-12 HISTORY — DX: Hypothyroidism, unspecified: E03.9

## 2017-12-12 HISTORY — DX: Ventral hernia without obstruction or gangrene: K43.9

## 2017-12-12 HISTORY — DX: Pneumonia, unspecified organism: J18.9

## 2017-12-12 LAB — CBC
HCT: 38.8 % (ref 36.0–46.0)
Hemoglobin: 12.2 g/dL (ref 12.0–15.0)
MCH: 26.5 pg (ref 26.0–34.0)
MCHC: 31.4 g/dL (ref 30.0–36.0)
MCV: 84.2 fL (ref 78.0–100.0)
PLATELETS: 564 10*3/uL — AB (ref 150–400)
RBC: 4.61 MIL/uL (ref 3.87–5.11)
RDW: 14.8 % (ref 11.5–15.5)
WBC: 10.6 10*3/uL — AB (ref 4.0–10.5)

## 2017-12-12 LAB — BASIC METABOLIC PANEL
ANION GAP: 5 (ref 5–15)
BUN: 10 mg/dL (ref 6–20)
CALCIUM: 9.2 mg/dL (ref 8.9–10.3)
CHLORIDE: 102 mmol/L (ref 101–111)
CO2: 31 mmol/L (ref 22–32)
CREATININE: 0.8 mg/dL (ref 0.44–1.00)
GFR calc Af Amer: >60 mL/min (ref >60)
GFR calc non Af Amer: >60 mL/min (ref >60)
Glucose, Bld: 96 mg/dL (ref 65–99)
Potassium: 4.7 mmol/L (ref 3.5–5.1)
SODIUM: 138 mmol/L (ref 135–145)

## 2017-12-12 NOTE — Progress Notes (Signed)
Clearance 09-22-17 for Roux en y  Epic  Stress 07-21-17 epic  Echo 07-20-17 epic  Cath 08-05-17 epic  ekg 08-05-17 epic   hgba1c 11-04-17 epic

## 2017-12-13 ENCOUNTER — Encounter (HOSPITAL_COMMUNITY): Payer: Self-pay | Admitting: Emergency Medicine

## 2017-12-13 ENCOUNTER — Inpatient Hospital Stay (HOSPITAL_COMMUNITY): Payer: Medicare HMO | Admitting: Anesthesiology

## 2017-12-13 ENCOUNTER — Encounter (HOSPITAL_COMMUNITY)
Admission: RE | Disposition: A | Discharge status: Home / Self Care | Payer: Self-pay | Source: Ambulatory Visit | Attending: General Surgery

## 2017-12-13 ENCOUNTER — Inpatient Hospital Stay (HOSPITAL_COMMUNITY)
Admission: RE | Admit: 2017-12-13 | Discharge: 2017-12-14 | DRG: 354 | Disposition: A | Discharge status: Home / Self Care | Payer: Medicare HMO | Source: Ambulatory Visit | Attending: General Surgery | Admitting: General Surgery

## 2017-12-13 DIAGNOSIS — K432 Incisional hernia without obstruction or gangrene: Secondary | ICD-10-CM | POA: Diagnosis not present

## 2017-12-13 DIAGNOSIS — K219 Gastro-esophageal reflux disease without esophagitis: Secondary | ICD-10-CM | POA: Diagnosis present

## 2017-12-13 DIAGNOSIS — T8183XA Persistent postprocedural fistula, initial encounter: Secondary | ICD-10-CM | POA: Diagnosis present

## 2017-12-13 DIAGNOSIS — Z90710 Acquired absence of both cervix and uterus: Secondary | ICD-10-CM

## 2017-12-13 DIAGNOSIS — K5909 Other constipation: Secondary | ICD-10-CM | POA: Diagnosis present

## 2017-12-13 DIAGNOSIS — K316 Fistula of stomach and duodenum: Secondary | ICD-10-CM | POA: Diagnosis present

## 2017-12-13 DIAGNOSIS — Z6841 Body Mass Index (BMI) 40.0 and over, adult: Secondary | ICD-10-CM | POA: Diagnosis not present

## 2017-12-13 DIAGNOSIS — R7303 Prediabetes: Secondary | ICD-10-CM | POA: Diagnosis present

## 2017-12-13 DIAGNOSIS — M549 Dorsalgia, unspecified: Secondary | ICD-10-CM | POA: Diagnosis present

## 2017-12-13 DIAGNOSIS — R1012 Left upper quadrant pain: Secondary | ICD-10-CM | POA: Diagnosis not present

## 2017-12-13 DIAGNOSIS — Z9884 Bariatric surgery status: Secondary | ICD-10-CM

## 2017-12-13 DIAGNOSIS — Z7984 Long term (current) use of oral hypoglycemic drugs: Secondary | ICD-10-CM | POA: Diagnosis not present

## 2017-12-13 DIAGNOSIS — K760 Fatty (change of) liver, not elsewhere classified: Secondary | ICD-10-CM | POA: Diagnosis present

## 2017-12-13 DIAGNOSIS — R1033 Periumbilical pain: Secondary | ICD-10-CM | POA: Diagnosis present

## 2017-12-13 DIAGNOSIS — Z23 Encounter for immunization: Secondary | ICD-10-CM

## 2017-12-13 DIAGNOSIS — G8929 Other chronic pain: Secondary | ICD-10-CM | POA: Diagnosis present

## 2017-12-13 DIAGNOSIS — Z9081 Acquired absence of spleen: Secondary | ICD-10-CM

## 2017-12-13 DIAGNOSIS — Z79899 Other long term (current) drug therapy: Secondary | ICD-10-CM | POA: Diagnosis not present

## 2017-12-13 HISTORY — PX: GASTRIC ROUX-EN-Y: SHX5262

## 2017-12-13 LAB — GLUCOSE, CAPILLARY
Glucose-Capillary: 114 mg/dL — ABNORMAL HIGH (ref 65–99)
Glucose-Capillary: 146 mg/dL — ABNORMAL HIGH (ref 65–99)
Glucose-Capillary: 125 mg/dL — ABNORMAL HIGH (ref 65–99)

## 2017-12-13 SURGERY — LAPAROSCOPIC ROUX-EN-Y GASTRIC BYPASS WITH UPPER ENDOSCOPY
Anesthesia: General | Site: Abdomen

## 2017-12-13 MED ORDER — ENOXAPARIN SODIUM 40 MG/0.4ML ~~LOC~~ SOLN
40.0000 mg | SUBCUTANEOUS | Status: DC
  Administered 2017-12-13: 40 mg via SUBCUTANEOUS
  Filled 2017-12-13: qty 0.4

## 2017-12-13 MED ORDER — PROPOFOL 10 MG/ML IV BOLUS
INTRAVENOUS | Status: AC
  Filled 2017-12-13: qty 40

## 2017-12-13 MED ORDER — HEPARIN SODIUM (PORCINE) 5000 UNIT/ML IJ SOLN
5000.0000 [IU] | Freq: Once | INTRAMUSCULAR | Status: AC
  Administered 2017-12-13: 5000 [IU] via SUBCUTANEOUS
  Filled 2017-12-13: qty 1

## 2017-12-13 MED ORDER — BUPIVACAINE-EPINEPHRINE (PF) 0.5% -1:200000 IJ SOLN
INTRAMUSCULAR | Status: DC | PRN
  Administered 2017-12-13: 30 mL

## 2017-12-13 MED ORDER — DEXAMETHASONE SODIUM PHOSPHATE 10 MG/ML IJ SOLN
INTRAMUSCULAR | Status: AC
  Filled 2017-12-13: qty 1

## 2017-12-13 MED ORDER — MORPHINE SULFATE (PF) 4 MG/ML IV SOLN
INTRAVENOUS | Status: AC
  Filled 2017-12-13: qty 1

## 2017-12-13 MED ORDER — PROPOFOL 10 MG/ML IV BOLUS
INTRAVENOUS | Status: DC | PRN
  Administered 2017-12-13: 150 mg via INTRAVENOUS

## 2017-12-13 MED ORDER — GABAPENTIN 300 MG PO CAPS
300.0000 mg | ORAL_CAPSULE | Freq: Once | ORAL | Status: AC
  Administered 2017-12-13: 300 mg via ORAL
  Filled 2017-12-13: qty 1

## 2017-12-13 MED ORDER — FENTANYL CITRATE (PF) 100 MCG/2ML IJ SOLN
INTRAMUSCULAR | Status: AC
  Filled 2017-12-13: qty 2

## 2017-12-13 MED ORDER — ROCURONIUM BROMIDE 50 MG/5ML IV SOSY
PREFILLED_SYRINGE | INTRAVENOUS | Status: DC | PRN
  Administered 2017-12-13: 20 mg via INTRAVENOUS
  Administered 2017-12-13: 50 mg via INTRAVENOUS
  Administered 2017-12-13 (×4): 20 mg via INTRAVENOUS

## 2017-12-13 MED ORDER — GABAPENTIN 300 MG PO CAPS
300.0000 mg | ORAL_CAPSULE | Freq: Two times a day (BID) | ORAL | Status: DC
  Administered 2017-12-13 – 2017-12-14 (×2): 300 mg via ORAL
  Filled 2017-12-13 (×2): qty 1

## 2017-12-13 MED ORDER — HYDROMORPHONE HCL 1 MG/ML IJ SOLN
INTRAMUSCULAR | Status: AC
  Filled 2017-12-13: qty 1

## 2017-12-13 MED ORDER — THYROID 60 MG PO TABS
60.0000 mg | ORAL_TABLET | Freq: Every day | ORAL | Status: DC
  Administered 2017-12-14: 60 mg via ORAL
  Filled 2017-12-13: qty 1

## 2017-12-13 MED ORDER — FENTANYL CITRATE (PF) 100 MCG/2ML IJ SOLN
25.0000 ug | INTRAMUSCULAR | Status: DC | PRN
  Administered 2017-12-13 (×3): 50 ug via INTRAVENOUS

## 2017-12-13 MED ORDER — SUGAMMADEX SODIUM 500 MG/5ML IV SOLN
INTRAVENOUS | Status: DC | PRN
  Administered 2017-12-13: 300 mg via INTRAVENOUS

## 2017-12-13 MED ORDER — SODIUM CHLORIDE 0.9 % IJ SOLN
INTRAMUSCULAR | Status: AC
  Filled 2017-12-13: qty 10

## 2017-12-13 MED ORDER — EVICEL 5 ML EX KIT
PACK | Freq: Once | CUTANEOUS | Status: AC
  Administered 2017-12-13: 1
  Filled 2017-12-13: qty 1

## 2017-12-13 MED ORDER — BUPIVACAINE-EPINEPHRINE (PF) 0.5% -1:200000 IJ SOLN
INTRAMUSCULAR | Status: AC
  Filled 2017-12-13: qty 30

## 2017-12-13 MED ORDER — TRAMADOL HCL 50 MG PO TABS
50.0000 mg | ORAL_TABLET | Freq: Four times a day (QID) | ORAL | Status: DC | PRN
  Administered 2017-12-14: 50 mg via ORAL
  Filled 2017-12-13: qty 1

## 2017-12-13 MED ORDER — OXYCODONE HCL 5 MG PO TABS
5.0000 mg | ORAL_TABLET | ORAL | Status: DC | PRN
  Administered 2017-12-13: 5 mg via ORAL
  Administered 2017-12-13 – 2017-12-14 (×2): 10 mg via ORAL
  Filled 2017-12-13: qty 2
  Filled 2017-12-13: qty 1
  Filled 2017-12-13 (×2): qty 2

## 2017-12-13 MED ORDER — ALBUTEROL SULFATE HFA 108 (90 BASE) MCG/ACT IN AERS
INHALATION_SPRAY | RESPIRATORY_TRACT | Status: AC
  Filled 2017-12-13: qty 6.7

## 2017-12-13 MED ORDER — DEXTROSE 5 % IV SOLN
2.0000 g | Freq: Once | INTRAVENOUS | Status: DC
  Filled 2017-12-13: qty 2

## 2017-12-13 MED ORDER — INFLUENZA VAC SPLIT HIGH-DOSE 0.5 ML IM SUSY
0.5000 mL | PREFILLED_SYRINGE | INTRAMUSCULAR | Status: AC
  Administered 2017-12-14: 0.5 mL via INTRAMUSCULAR
  Filled 2017-12-13: qty 0.5

## 2017-12-13 MED ORDER — CYCLOBENZAPRINE HCL 10 MG PO TABS
10.0000 mg | ORAL_TABLET | Freq: Three times a day (TID) | ORAL | Status: DC | PRN
  Administered 2017-12-14: 10 mg via ORAL
  Filled 2017-12-13: qty 1

## 2017-12-13 MED ORDER — ONDANSETRON HCL 4 MG/2ML IJ SOLN
INTRAMUSCULAR | Status: DC | PRN
  Administered 2017-12-13: 4 mg via INTRAVENOUS

## 2017-12-13 MED ORDER — SUGAMMADEX SODIUM 500 MG/5ML IV SOLN
INTRAVENOUS | Status: AC
  Filled 2017-12-13: qty 5

## 2017-12-13 MED ORDER — ALBUTEROL SULFATE HFA 108 (90 BASE) MCG/ACT IN AERS
INHALATION_SPRAY | RESPIRATORY_TRACT | Status: DC | PRN
  Administered 2017-12-13: 2 via RESPIRATORY_TRACT

## 2017-12-13 MED ORDER — OXYCODONE HCL 5 MG PO TABS
ORAL_TABLET | ORAL | Status: AC
  Filled 2017-12-13: qty 1

## 2017-12-13 MED ORDER — FENTANYL CITRATE (PF) 250 MCG/5ML IJ SOLN
INTRAMUSCULAR | Status: AC
Start: 2017-12-13 — End: ?
  Filled 2017-12-13: qty 5

## 2017-12-13 MED ORDER — FENTANYL CITRATE (PF) 100 MCG/2ML IJ SOLN
INTRAMUSCULAR | Status: DC | PRN
  Administered 2017-12-13 (×6): 50 ug via INTRAVENOUS

## 2017-12-13 MED ORDER — ACETAMINOPHEN 500 MG PO TABS
1000.0000 mg | ORAL_TABLET | Freq: Once | ORAL | Status: AC
  Administered 2017-12-13: 1000 mg via ORAL
  Filled 2017-12-13: qty 2

## 2017-12-13 MED ORDER — MIDAZOLAM HCL 5 MG/5ML IJ SOLN
INTRAMUSCULAR | Status: DC | PRN
  Administered 2017-12-13: 2 mg via INTRAVENOUS

## 2017-12-13 MED ORDER — LIDOCAINE 2% (20 MG/ML) 5 ML SYRINGE: INTRAMUSCULAR | Status: DC | PRN

## 2017-12-13 MED ORDER — SODIUM CHLORIDE 0.9 % IJ SOLN
INTRAMUSCULAR | Status: AC
  Filled 2017-12-13: qty 50

## 2017-12-13 MED ORDER — POTASSIUM CHLORIDE 2 MEQ/ML IV SOLN
INTRAVENOUS | Status: DC
  Administered 2017-12-13: 21:00:00 via INTRAVENOUS
  Filled 2017-12-13 (×4): qty 1000

## 2017-12-13 MED ORDER — ONDANSETRON HCL 4 MG/2ML IJ SOLN: 4.0000 mg | Freq: Once | INTRAMUSCULAR | Status: DC | PRN

## 2017-12-13 MED ORDER — KETAMINE HCL 10 MG/ML IJ SOLN
INTRAMUSCULAR | Status: DC | PRN
  Administered 2017-12-13: 10 mg via INTRAVENOUS
  Administered 2017-12-13: 25 mg via INTRAVENOUS

## 2017-12-13 MED ORDER — MIDAZOLAM HCL 2 MG/2ML IJ SOLN
INTRAMUSCULAR | Status: AC
  Filled 2017-12-13: qty 2

## 2017-12-13 MED ORDER — LACTATED RINGERS IR SOLN
Status: DC | PRN
  Administered 2017-12-13: 1000 mL

## 2017-12-13 MED ORDER — ROCURONIUM BROMIDE 50 MG/5ML IV SOSY
PREFILLED_SYRINGE | INTRAVENOUS | Status: AC
  Filled 2017-12-13: qty 5

## 2017-12-13 MED ORDER — HYDROMORPHONE HCL 1 MG/ML IJ SOLN
0.5000 mg | INTRAMUSCULAR | Status: AC | PRN
  Administered 2017-12-13 (×2): 0.5 mg via INTRAVENOUS

## 2017-12-13 MED ORDER — DEXAMETHASONE SODIUM PHOSPHATE 10 MG/ML IJ SOLN
INTRAMUSCULAR | Status: DC | PRN
  Administered 2017-12-13: 6 mg via INTRAVENOUS

## 2017-12-13 MED ORDER — PANTOPRAZOLE SODIUM 40 MG IV SOLR
40.0000 mg | Freq: Every day | INTRAVENOUS | Status: DC
  Administered 2017-12-13: 40 mg via INTRAVENOUS
  Filled 2017-12-13: qty 40

## 2017-12-13 MED ORDER — ONDANSETRON 4 MG PO TBDP: 4.0000 mg | ORAL_TABLET | Freq: Four times a day (QID) | ORAL | Status: DC | PRN

## 2017-12-13 MED ORDER — LACTATED RINGERS IV SOLN
INTRAVENOUS | Status: DC | PRN
  Administered 2017-12-13 (×2): via INTRAVENOUS

## 2017-12-13 MED ORDER — KETAMINE HCL 10 MG/ML IJ SOLN
INTRAMUSCULAR | Status: AC
  Filled 2017-12-13: qty 1

## 2017-12-13 MED ORDER — ONDANSETRON HCL 4 MG/2ML IJ SOLN: 4.0000 mg | Freq: Four times a day (QID) | INTRAMUSCULAR | Status: DC | PRN

## 2017-12-13 MED ORDER — PRAVASTATIN SODIUM 20 MG PO TABS
20.0000 mg | ORAL_TABLET | Freq: Every day | ORAL | Status: DC
  Administered 2017-12-13: 20 mg via ORAL
  Filled 2017-12-13: qty 1

## 2017-12-13 MED ORDER — LIDOCAINE 2% (20 MG/ML) 5 ML SYRINGE
INTRAMUSCULAR | Status: AC
  Filled 2017-12-13: qty 15

## 2017-12-13 MED ORDER — LIDOCAINE 2% (20 MG/ML) 5 ML SYRINGE
INTRAMUSCULAR | Status: AC
  Filled 2017-12-13: qty 5

## 2017-12-13 MED ORDER — LIDOCAINE 2% (20 MG/ML) 5 ML SYRINGE
INTRAMUSCULAR | Status: DC | PRN
  Administered 2017-12-13: 1.5 mg/kg/h via INTRAVENOUS

## 2017-12-13 MED ORDER — INSULIN ASPART 100 UNIT/ML ~~LOC~~ SOLN
0.0000 [IU] | Freq: Three times a day (TID) | SUBCUTANEOUS | Status: DC
  Administered 2017-12-13 – 2017-12-14 (×2): 2 [IU] via SUBCUTANEOUS

## 2017-12-13 MED ORDER — ONDANSETRON HCL 4 MG/2ML IJ SOLN
INTRAMUSCULAR | Status: AC
  Filled 2017-12-13: qty 2

## 2017-12-13 MED ORDER — GLYCOPYRROLATE 0.2 MG/ML IJ SOLN
INTRAMUSCULAR | Status: DC | PRN
  Administered 2017-12-13: 0.2 mg via INTRAVENOUS

## 2017-12-13 MED ORDER — MORPHINE SULFATE (PF) 4 MG/ML IV SOLN
2.0000 mg | INTRAVENOUS | Status: DC | PRN
  Administered 2017-12-13 (×2): 2 mg via INTRAVENOUS
  Filled 2017-12-13: qty 1

## 2017-12-13 MED ORDER — SODIUM CHLORIDE 0.9 % IJ SOLN
INTRAMUSCULAR | Status: DC | PRN
  Administered 2017-12-13: 50 mL

## 2017-12-13 MED ORDER — BUPIVACAINE LIPOSOME 1.3 % IJ SUSP
20.0000 mL | Freq: Once | INTRAMUSCULAR | Status: AC
  Administered 2017-12-13: 20 mL
  Filled 2017-12-13: qty 20

## 2017-12-13 MED ORDER — DEXTROSE 5 % IV SOLN
2.0000 g | Freq: Once | INTRAVENOUS | Status: AC
  Administered 2017-12-13 (×3): 2 g via INTRAVENOUS
  Filled 2017-12-13: qty 2

## 2017-12-13 SURGICAL SUPPLY — 117 items
APPLICATOR COTTON TIP 6IN STRL (MISCELLANEOUS) ×8 IMPLANT
APPLIER CLIP ROT 10 11.4 M/L (STAPLE)
APPLIER CLIP ROT 13.4 12 LRG (CLIP)
BINDER ABDOMINAL 12 ML 46-62 (SOFTGOODS) ×4 IMPLANT
BIOPATCH WHT 1IN DISK W/4.0 H (GAUZE/BANDAGES/DRESSINGS) IMPLANT
BLADE EXTENDED COATED 6.5IN (ELECTRODE) IMPLANT
BLADE SURG SZ11 CARB STEEL (BLADE) ×4 IMPLANT
CABLE HIGH FREQUENCY MONO STRZ (ELECTRODE) ×4 IMPLANT
CHLORAPREP W/TINT 26ML (MISCELLANEOUS) ×4 IMPLANT
CLIP APPLIE ROT 10 11.4 M/L (STAPLE) IMPLANT
CLIP APPLIE ROT 13.4 12 LRG (CLIP) IMPLANT
CLIP SUT LAPRA TY ABSORB (SUTURE) ×4 IMPLANT
COVER SURGICAL LIGHT HANDLE (MISCELLANEOUS) ×4 IMPLANT
CUTTER FLEX LINEAR 45M (STAPLE) ×4 IMPLANT
DECANTER SPIKE VIAL GLASS SM (MISCELLANEOUS) ×4 IMPLANT
DERMABOND ADVANCED (GAUZE/BANDAGES/DRESSINGS) ×2
DERMABOND ADVANCED .7 DNX12 (GAUZE/BANDAGES/DRESSINGS) ×2 IMPLANT
DEVICE PMI PUNCTURE CLOSURE (MISCELLANEOUS) ×4 IMPLANT
DEVICE SECURE STRAP 25 ABSORB (INSTRUMENTS) ×4 IMPLANT
DEVICE SUT QUICK LOAD TK 5 (STAPLE) IMPLANT
DEVICE SUT TI-KNOT TK 5X26 (MISCELLANEOUS) IMPLANT
DEVICE SUTURE ENDOST 10MM (ENDOMECHANICALS) IMPLANT
DEVICE TI KNOT TK5 (MISCELLANEOUS)
DEVICE TROCAR PUNCTURE CLOSURE (ENDOMECHANICALS) ×4 IMPLANT
DISSECTOR BLUNT TIP ENDO 5MM (MISCELLANEOUS) IMPLANT
DRAIN CHANNEL 19F RND (DRAIN) IMPLANT
DRAIN PENROSE 18X1/4 LTX STRL (WOUND CARE) ×4 IMPLANT
DRAPE INCISE IOBAN 66X45 STRL (DRAPES) ×4 IMPLANT
DRAPE LAPAROSCOPIC ABDOMINAL (DRAPES) ×4 IMPLANT
DRSG OPSITE POSTOP 4X10 (GAUZE/BANDAGES/DRESSINGS) IMPLANT
DRSG OPSITE POSTOP 4X8 (GAUZE/BANDAGES/DRESSINGS) IMPLANT
ELECT PENCIL ROCKER SW 15FT (MISCELLANEOUS) ×4 IMPLANT
ELECT REM PT RETURN 15FT ADLT (MISCELLANEOUS) ×4 IMPLANT
EVACUATOR SILICONE 100CC (DRAIN) IMPLANT
GAUZE SPONGE 4X4 12PLY STRL (GAUZE/BANDAGES/DRESSINGS) ×4 IMPLANT
GLOVE BIOGEL PI IND STRL 7.0 (GLOVE) ×2 IMPLANT
GLOVE BIOGEL PI IND STRL 7.5 (GLOVE) ×2 IMPLANT
GLOVE BIOGEL PI INDICATOR 7.0 (GLOVE) ×2
GLOVE BIOGEL PI INDICATOR 7.5 (GLOVE) ×2
GLOVE ECLIPSE 7.5 STRL STRAW (GLOVE) ×4 IMPLANT
GOWN STRL REUS W/TWL LRG LVL3 (GOWN DISPOSABLE) ×4 IMPLANT
GOWN STRL REUS W/TWL XL LVL3 (GOWN DISPOSABLE) ×8 IMPLANT
HEMOSTAT SURGICEL 4X8 (HEMOSTASIS) IMPLANT
HOVERMATT SINGLE USE (MISCELLANEOUS) ×4 IMPLANT
KIT BASIN OR (CUSTOM PROCEDURE TRAY) ×4 IMPLANT
KIT GASTRIC LAVAGE 34FR ADT (SET/KITS/TRAYS/PACK) ×4 IMPLANT
LUBRICANT JELLY K Y 4OZ (MISCELLANEOUS) IMPLANT
MARKER SKIN DUAL TIP RULER LAB (MISCELLANEOUS) ×4 IMPLANT
MESH VENTRALIGHT ST 6X8 (Mesh Specialty) ×2 IMPLANT
MESH VENTRLGHT ELLIPSE 8X6XMFL (Mesh Specialty) ×2 IMPLANT
NEEDLE HYPO 22GX1.5 SAFETY (NEEDLE) IMPLANT
NEEDLE SPNL 22GX3.5 QUINCKE BK (NEEDLE) ×4 IMPLANT
NS IRRIG 1000ML POUR BTL (IV SOLUTION) ×4 IMPLANT
PACK CARDIOVASCULAR III (CUSTOM PROCEDURE TRAY) ×4 IMPLANT
PACK GENERAL/GYN (CUSTOM PROCEDURE TRAY) ×4 IMPLANT
POUCH SPECIMEN RETRIEVAL 10MM (ENDOMECHANICALS) IMPLANT
QUICK LOAD TK 5 (STAPLE)
RELOAD 45 VASCULAR/THIN (ENDOMECHANICALS) ×4 IMPLANT
RELOAD ENDO STITCH 2.0 (ENDOMECHANICALS) ×20
RELOAD STAPLE TA45 3.5 REG BLU (ENDOMECHANICALS) ×4 IMPLANT
RELOAD STAPLER BLUE 60MM (STAPLE) ×4 IMPLANT
RELOAD STAPLER GOLD 60MM (STAPLE) ×2 IMPLANT
RELOAD STAPLER WHITE 60MM (STAPLE) ×2 IMPLANT
SCISSORS METZENBAUM CVD 44CM (INSTRUMENTS) ×4 IMPLANT
SET IRRIG TUBING LAPAROSCOPIC (IRRIGATION / IRRIGATOR) ×4 IMPLANT
SHEARS HARMONIC ACE PLUS 45CM (MISCELLANEOUS) ×4 IMPLANT
SLEEVE ADV FIXATION 12X100MM (TROCAR) ×8 IMPLANT
SLEEVE ADV FIXATION 5X100MM (TROCAR) ×12 IMPLANT
SLEEVE XCEL OPT CAN 5 100 (ENDOMECHANICALS) ×4 IMPLANT
SOLUTION ANTI FOG 6CC (MISCELLANEOUS) ×4 IMPLANT
SPONGE LAP 18X18 X RAY DECT (DISPOSABLE) ×4 IMPLANT
STAPLER ECHELON LONG 60 440 (INSTRUMENTS) IMPLANT
STAPLER RELOAD BLUE 60MM (STAPLE) ×8
STAPLER RELOAD GOLD 60MM (STAPLE) ×4
STAPLER RELOAD WHITE 60MM (STAPLE) ×4
STAPLER VISISTAT 35W (STAPLE) ×8 IMPLANT
SUT ETHIBOND 2 0 SH (SUTURE)
SUT ETHIBOND 2 0 SH 36X2 (SUTURE) IMPLANT
SUT MNCRL AB 4-0 PS2 18 (SUTURE) ×4 IMPLANT
SUT NOVA NAB DX-16 0-1 5-0 T12 (SUTURE) ×8 IMPLANT
SUT NOVA NAB GS-21 0 18 T12 DT (SUTURE) ×8 IMPLANT
SUT PDS AB 1 CTX 36 (SUTURE) IMPLANT
SUT PDS AB 2-0 CT2 27 (SUTURE) IMPLANT
SUT PROLENE 0 CT 1 30 (SUTURE) IMPLANT
SUT PROLENE 0 CT 1 CR/8 (SUTURE) IMPLANT
SUT RELOAD ENDO STITCH 2 48X1 (ENDOMECHANICALS) ×10
SUT RELOAD ENDO STITCH 2.0 (ENDOMECHANICALS) ×10
SUT SILK 2 0 (SUTURE)
SUT SILK 2 0 SH CR/8 (SUTURE) IMPLANT
SUT SILK 2-0 18XBRD TIE 12 (SUTURE) IMPLANT
SUT SILK 3 0 (SUTURE)
SUT SILK 3-0 18XBRD TIE 12 (SUTURE) IMPLANT
SUT SURGIDAC NAB ES-9 0 48 120 (SUTURE) IMPLANT
SUT VIC AB 2-0 SH 27 (SUTURE)
SUT VIC AB 2-0 SH 27X BRD (SUTURE) IMPLANT
SUT VIC AB 3-0 SH 27 (SUTURE) ×4
SUT VIC AB 3-0 SH 27X BRD (SUTURE) ×4 IMPLANT
SUT VIC AB 3-0 SH 27XBRD (SUTURE) IMPLANT
SUTURE RELOAD END STTCH 2 48X1 (ENDOMECHANICALS) ×10 IMPLANT
SUTURE RELOAD ENDO STITCH 2.0 (ENDOMECHANICALS) ×10 IMPLANT
SYR 10ML ECCENTRIC (SYRINGE) ×4 IMPLANT
SYR 20CC LL (SYRINGE) ×8 IMPLANT
SYR 50ML LL SCALE MARK (SYRINGE) ×4 IMPLANT
TAPE CLOTH SURG 6X10 WHT LF (GAUZE/BANDAGES/DRESSINGS) ×4 IMPLANT
TIP RIGID 35CM EVICEL (HEMOSTASIS) ×4 IMPLANT
TOWEL OR 17X26 10 PK STRL BLUE (TOWEL DISPOSABLE) ×4 IMPLANT
TOWEL OR NON WOVEN STRL DISP B (DISPOSABLE) ×4 IMPLANT
TRAY FOLEY W/METER SILVER 16FR (SET/KITS/TRAYS/PACK) ×4 IMPLANT
TROCAR ADV FIXATION 12X100MM (TROCAR) ×4 IMPLANT
TROCAR ADV FIXATION 5X100MM (TROCAR) ×4 IMPLANT
TROCAR BLADELESS OPT 5 100 (ENDOMECHANICALS) ×4 IMPLANT
TROCAR XCEL 12X100 BLDLESS (ENDOMECHANICALS) ×4 IMPLANT
TUBE CALIBRATION LAPBAND (TUBING) IMPLANT
TUBING CONNECTING 10 (TUBING) IMPLANT
TUBING CONNECTING 10' (TUBING)
TUBING ENDO SMARTCAP PENTAX (MISCELLANEOUS) ×4 IMPLANT
TUBING INSUF HEATED (TUBING) ×4 IMPLANT

## 2017-12-13 NOTE — Progress Notes (Signed)
In to see patient post surgery.  Patient with past history of bypass surgery, lap ventral hernia repair today.  Contact information provided to patient for any future resources.

## 2017-12-13 NOTE — Progress Notes (Addendum)
Spoke with Dr. Excell Seltzer , no orders for today were put in.  Verbal order for 2g cefoxitin, 300mg  gabapentin, 1g po tylenol, 5000 unit heparin sq. Consent obtained by darlene whitlow, RN.

## 2017-12-13 NOTE — Transfer of Care (Signed)
Immediate Anesthesia Transfer of Care Note  Patient: Kathleen Miller  Procedure(s) Performed: LAPAROSCOPIC ASSTED VENTRAL HERNIA REPAIR, Upper Endo (N/A Abdomen)  Patient Location: PACU  Anesthesia Type:General  Level of Consciousness: awake, alert , sedated and patient cooperative  Airway & Oxygen Therapy: Patient spontaneous breathing on face mask.  Post-op Assessment: Report given to RN, Post -op Vital signs reviewed and stable and Patient moving all extremities X 4  Post vital signs: Reviewed and stable  Last Vitals:  Vitals:   12/13/17 0543 12/13/17 1219  BP: (!) 156/86 132/73  Pulse: 91 87  Resp: 16 19  Temp: 36.8 C (P) 36.8 C  SpO2: 97% 92%    Last Pain:  Vitals:   12/13/17 0543  TempSrc: Oral      Patients Stated Pain Goal: 4 (78/29/56 2130)  Complications: No apparent anesthesia complications

## 2017-12-13 NOTE — Op Note (Signed)
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  Records are unavailable.  She presents with recurrent mid abdominal pain and has a tender reducible ventral incisional hernia near the umbilicus.  We have obtained an upper GI series also showing a gastro-gastric fistula with her remnant stomach filling promptly from her pouch as well as pouch emptying from gastrojejunostomy.  She has had extensive preoperative workup and counseling including cardiac clearance.  We have elected to proceed with laparoscopy and attempt to take down her gastro-gastric fistula with the understanding that if her adhesions are so severe that I felt this was unsafe or could lead to contamination compromising her ventral hernia repair which is her major symptomatic problem that we would not proceed with the takedown as this is not causing acute issues other than possibly contributing to weight regain.  This is all been discussed in detail including of the risks and expected recovery etc. documented elsewhere.    Procedure Detail: Patient was brought to the operating room, placed in supine position on the operating table, and general endotracheal anesthesia induced.  She received preoperative IV antibiotics.  She was given some cutaneous heparin.  PAS were in place.  The abdomen was widely sterilely prepped and draped.  Patient timeout was performed and correct procedure verified.  Access was initially tried with a 5 mm Optiview trocar in the left upper quadrant.  I visualize this across the peritoneum however we  could not obtain good visualization with fatty tissue obscuring the view despite good insufflation of the entire abdomen.  Following this a 5 mm Optiview trocar was placed in the right upper quadrant without difficulty.  There was no evidence of trocar injury.  The left upper quadrant trocar was obstructed by omentum but no bowel in the vicinity.  An additional 5 mm trocar was placed in the right lateral abdomen.  An extensive adhesio lysis was then performed.  There were mainly omental but also some filmy colonic and small bowel adhesions to the mid abdomen and the hernia in the midline incision.  These were all carefully taken down with sharp and harmonic scalpel dissection.  The entire anterior abdomen was cleared.  No evidence of bowel injury.  A 5 mm trocar was placed near the umbilicus for the new camera port.  The patient was placed in steep reverse Trendelenburg.  I continue the easy lysis superiorly.  The inferior edge of the liver was identified and this was mobilized away from omentum and the gastric remnant identified and dissected.  The Roux limb was identified which appeared to be retrocolic antigastric.  Fairly dense adhesions of the left lobe of liver were taken off of the Roux limb and the gastric remnant.  We continued to dissect up toward the hiatus.  I identified the end of the candycane of the Roux limb facing to the left upper quadrant.  The Roux limb was further dissected up off of the gastric remnant but as I continued superiorly the small bowel was extremely densely adherent to the stomach posteriorly.  At this point I had not identified the staple line dividing the stomach.  I assume that this was an  undivided gastric stapling.  At this point Dr. Hassell Done went above and performed upper endoscopy.  The gastrojejunostomy was identified and appeared normal and I was able to localize this very high near the hiatus.  He also was able to enter the gastric gastric fistula which was still up above  the level but I had dissected the jejunum off of the stomach.  At this point it appeared clear that the jejunum was densely adherent down to the gastric staple line at that the anastomosis was several centimeters above this.  Further carefully examining the jejunum and the stomach it was essentially fused to the staple line and I felt that the dissection needed to expose the gastric staple line certainly risks of injury to the jejunum or stomach and it appeared that the gastrojejunostomy was immediately above the staple line and could be entered or compromised easily with the dissection.  As I previously discussed with the patient I felt that this risk outweighed any benefit from taking down the fistula and we elected to proceed with the hernia repair before we had any contamination.  Attention was turned to the hernia.  There was actually a fairly discrete approximately 5 cm defect just above the umbilicus.  The remainder of the midline fascia appeared intact.  I elected to perform a laparoscopic assisted repair.  The midline incision was opened for a distance of about 10 cm periumbilical and dissection carried down into the subtenons tissue.  The hernia sac was completely dissected away from surrounding subcutaneous tissue down to the level of the fascia and fascial edges were defined and appeared strong and there was no palpable defect inferiorly or superiorly along the midline incision.  It shows a 15 x 20 cm piece of VentralLight ST mesh.  6 circumferential 0 Novafil sutures were placed around 4 stay sutures.  I marked 6 corresponding areas on the anterior abdominal wall for the fixation sutures.  The mesh was moistened and introduced into the abdominal cavity through the incision.  The fascia was then closed in the midline with interrupted #1 Novafil.  We then reinsufflated the abdomen and the mesh was deployed taking care to keep the gel side toward the viscera.  The sutures were then retrieved through  small appropriate stab incisions through the anterior abdominal wall and the mesh brought up with nice taut broad deployment around the defect.  The mesh was then circumferentially tacked with the [place] including an inner layer.  This appeared to provide nice broad coverage of the defect.  I again carefully inspected the viscera in the upper abdomen and there was no evidence of bleeding or bowel injury or other problems.  All CO2 was evacuated and trochars removed after infiltrating the abdominal wall with dilute Exparel L.  The subcutaneous tissue at the midline was closed with running 3-0 Vicryl in this midline incision closed with staples.  Laparoscopic incisions and stab wounds closed with Monocryl and Dermabond.  Sponge needle and instrument counts were correct.  Dry sterile dressings applied.    Findings: Above  Estimated Blood Loss:  less than 50 mL         Drains: None  Blood Given: none          Specimens: None        Complications:  * No complications entered in OR log *         Disposition: PACU - hemodynamically stable.         Condition: stable

## 2017-12-13 NOTE — H&P (Signed)
History of Present Illness  The patient is a 72 year old female who presents with an incisional hernia.  CC: abdominal pain, hx of gastric bypass  Patient returns following cardiac clearance prior to planned surgery. Her original presentation was as follows:  Patient is referred by Dr. Janett Billow Copland for evaluation of periumbilical abdominal pain. Patient is followed by Dr. Niel Hummer for chronic back pain. Patient has seen Dr. Wilford Corner for colonoscopy. Patient gives a history of periumbilical abdominal pain occurring intermittently since 2014. She describes acute episodes where there is a masslike area to the left of the umbilicus which is quite hard and painful. This causes nausea. These episodes are becoming more frequent. She notes chronic constipation. Patient has a history of laparotomy for gastric bypass procedure in 1988 in [place]. Apparently this procedure was complicated by splenic injury and splenectomy was performed concurrently. It appears from her CT scan that she also underwent cholecystectomy at this time. Other abdominal surgery includes hysterectomy. This was laparoscopic around 2012. Patient has chronic back pain and is followed a physiatrist.   Patient states that she lost about 100 pounds following her surgery from 230 pounds to 130 pounds. She had some mild rebound gain of her weight but then starting about 2005 or so has had progressive weight regain up to her current weight of approximately 260. We have requested records from her original bypass surgery which was 1988 and we have been unable to obtain these records. After her initial visit here we did obtain an upper GI series. I have reviewed these films which show a clear gastro-gastric fistula from her pouch to her gastric remnant but no obstruction or problem with the gastrojejunostomy. CT scan was essentially unremarkable with hepatic steatosis and postop changes.  She has completed  cardiac clearance. Okayed for surgery. Still having a lot of pain at her hernia site which is excruciating at times. Some nausea related to this pain.   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 Gallbladder Surgery - Open  Gastric Bypass  Hysterectomy (not due to cancer) - Complete   Diagnostic Studies History  Colonoscopy  within last year Mammogram  >3 years ago Pap Smear  >5 years ago  Allergies  NSAIDs  Swelling. retain fluid and affected the pts brain Tolmetin Sodium *ANALGESICS - ANTI-INFLAMMATORY*  Swelling. Allergies Reconciled   Medication History  NP Thyroid (60MG  Tablet, Oral) Active. Cyclobenzaprine HCl (10MG  Tablet, Oral) Active. Furosemide (40MG  Tablet, Oral as needed) Active. Lovastatin (20MG  Tablet, Oral) Active. MetFORMIN HCl (500MG  Tablet, Oral) Active. TraMADol HCl (50MG  Tablet, Oral) Active. Medications Reconciled  Social History  Alcohol use  Occasional alcohol use. Caffeine use  Tea. No drug use  Tobacco use  Never smoker.  Family History  Melanoma  Family Members In General.  Pregnancy / Birth History  Age at menarche  77 years. Age of menopause  53-55 Contraceptive History  Oral contraceptives. Gravida  3 Maternal age  12-20 Para  0  Other Problems Back Pain  Oophorectomy  Left.  BP (!) 156/86   Pulse 91   Temp 98.2 F (36.8 C) (Oral)   Resp 16   Ht 5\' 4"  (1.626 m)   Wt 120.7 kg (266 lb)   SpO2 97%   BMI 45.66 kg/m  Physical Exam  The physical exam findings are as follows: Note:The physical exam findings are as follows: Note:General: Alert, pleasant morbidly obese Caucasian female, in no distress Skin: Warm and dry without rash or infection. HEENT: No palpable masses or  thyromegaly. Sclera nonicteric. Pupils equal round and reactive. Oropharynx clear. Lymph nodes: No cervical, supraclavicular, or inguinal nodes palpable. Lungs: Breath sounds clear and equal. No wheezing or increased work of breathing. Cardiovascular: Regular rate and rhythm without murmer. 1+ lower extremity edema.. Abdomen: Obese. Long healed upper midline incision. Just above into the left of the umbilicus is a tender not completely reducible mass consistent with a incisional hernia. Extremities: 1+ edema, no joint swelling or deformity. No chronic venous stasis changes. Difficulty getting on and off the exam table due to back pain. Neurologic: Alert and fully oriented. Gait normal. No focal weakness. Psychiatric: Normal mood and affect. Thought content appropriate with normal judgement and insight    Assessment & Plan  INCISIONAL HERNIA, WITHOUT OBSTRUCTION OR GANGRENE (K43.2) Impression: She has markedly symptomatic incisional hernia. This will need to be repaired. I think a retrorectus technique with mesh is going to be the most durable repair. We discussed this procedure in detail including its indications and risks of infection and recurrence, anesthetic complications, bleeding and infection. The more difficult discussion is regarding division of her gastric gastric fistula. COMPLICATIONS OF GASTRIC BYPASS SURGERY (K91.89) Impression: She has a gastric gastric fistula from her previous open gastric bypass. We discussed pros and cons of repairing this at the same surgery. We discussed that this could be potentially very difficult surgery and would add risk including leakage or bleeding, length the surgery and injury to surrounding structures. Also there would be potentially some contamination that could affect her mesh hernia repair. After a long discussion regarding pros and cons she feels strongly that she would like to see if this could give her some weight loss or improve her prediabetes  which I think is reasonable. We would approach this laparoscopically and I told her that if there are dense adhesions or the surgery is felt to be too difficult or dangerous that we would not force this and would leave the fistula in place. We would then repair her hernia. I think if we used a 6 mesh and have minimal contamination this is not a major risk to her hernia repair. She understands all these issues and agrees. Current Plans Schedule for Surgery  Laparoscopic revision of gastric bypass with takedown of gastric gastric fistula followed by ventral incisional hernia repair with mesh.

## 2017-12-13 NOTE — Anesthesia Preprocedure Evaluation (Signed)
Anesthesia Evaluation  Patient identified by MRN, date of birth, ID band Patient awake    Reviewed: Allergy & Precautions, NPO status , Patient's Chart, lab work & pertinent test results  Airway Mallampati: II  TM Distance: >3 FB Neck ROM: Full    Dental  (+) Teeth Intact, Dental Advisory Given   Pulmonary neg pulmonary ROS,    Pulmonary exam normal breath sounds clear to auscultation       Cardiovascular hypertension, +CHF  Normal cardiovascular exam Rhythm:Regular Rate:Normal  Echo 8/18: Study Conclusions  - Left ventricle: The cavity size was normal. Systolic function was normal. The estimated ejection fraction was in the range of 50% to 55%. Wall motion was normal; there were no regional wall motion abnormalities. Doppler parameters are consistent with abnormal left ventricular relaxation (grade 1 diastolic dysfunction). There was no evidence of elevated ventricular filling pressure by Doppler parameters. - Aortic valve: There was mild regurgitation. - Aortic root: The aortic root was normal in size. - Mitral valve: There was no significant regurgitation. - Left atrium: The atrium was at the upper limits of normal in size. - Right ventricle: Systolic function was normal. - Pulmonary arteries: Systolic pressure was within the normal range. - Inferior vena cava: The vessel was normal in size. The   respirophasic diameter changes were in the normal range (= 50%), consistent with normal central venous pressure. - Pericardium, extracardiac: There was no pericardial effusion.   Neuro/Psych negative neurological ROS     GI/Hepatic Neg liver ROS, GERD  ,Gastric bypass s/p revision Ventral incisional hernia    Endo/Other  diabetes, Oral Hypoglycemic AgentsHypothyroidism Morbid obesity  Renal/GU negative Renal ROS     Musculoskeletal negative musculoskeletal ROS (+)   Abdominal   Peds  Hematology negative hematology  ROS (+)   Anesthesia Other Findings Day of surgery medications reviewed with the patient.  Reproductive/Obstetrics                             Anesthesia Physical Anesthesia Plan  ASA: III  Anesthesia Plan: General   Post-op Pain Management:    Induction: Intravenous  PONV Risk Score and Plan: 4 or greater and Midazolam, Dexamethasone, Ondansetron and Treatment may vary due to age or medical condition  Airway Management Planned: Oral ETT  Additional Equipment:   Intra-op Plan:   Post-operative Plan: Extubation in OR  Informed Consent: I have reviewed the patients History and Physical, chart, labs and discussed the procedure including the risks, benefits and alternatives for the proposed anesthesia with the patient or authorized representative who has indicated his/her understanding and acceptance.   Dental advisory given  Plan Discussed with: CRNA  Anesthesia Plan Comments: (Risks/benefits of general anesthesia discussed with patient including risk of damage to teeth, lips, gum, and tongue, nausea/vomiting, allergic reactions to medications, and the possibility of heart attack, stroke and death.  All patient questions answered.  Patient wishes to proceed.)        Anesthesia Quick Evaluation

## 2017-12-13 NOTE — Op Note (Signed)
Kathleen Miller [id number] 06-02-1946 12/13/2017  Preoperative diagnosis: gastro gastric fistula in patient with prior open nondivided gastric pouch roux en Y gastric bypass  Postoperative diagnosis: Same   Procedure: Upper endoscopy   Surgeon: Catalina Antigua B. Hassell Done  M.D., FACS   Anesthesia: Gen.   Indications for procedure: This patient was undergoing an exploration for a ventral hernia and to assess feasiblility of taking down gastrogastric fistula.    Description of procedure: The endoscopy was placed in the mouth and into the oropharynx and under endoscopic vision it was advanced to the esophagogastric junction.  The pouch was insufflated and the gastrojejunostomy identified and Dr. Excell Seltzer was able to correlate with the anatomy.  Proximal to this I was able to enter the broken down staple line on the left leading me into the distal fundus and antrum.  The pouch was high and not reacheable above the left lateral segment attachments to the gastric pouch.  .   No bleeding or leaks were detected.  The scope was withdrawn without difficulty.     Matt B. Hassell Done, MD, FACS General, Bariatric, & Minimally Invasive Surgery [facility] Surgery, [place]

## 2017-12-13 NOTE — Interval H&P Note (Signed)
History and Physical Interval Note:  12/13/2017 7:22 AM  Kathleen Miller  has presented today for surgery, with the diagnosis of Morbid Obesity, ventral incisional hernia  The various methods of treatment have been discussed with the patient and family. After consideration of risks, benefits and other options for treatment, the patient has consented to  Procedure(s): LAPAROSCOPIC Revision of Gastric By-Pass/RNY, Ventral incisional hernia repair, Upper Endo (N/A) HERNIA REPAIR VENTRAL ADULT (N/A) as a surgical intervention .  The patient's history has been reviewed, patient examined, no change in status, stable for surgery.  I have reviewed the patient's chart and labs.  Questions were answered to the patient's satisfaction.     Darene Lamer Quantisha Marsicano

## 2017-12-13 NOTE — Anesthesia Postprocedure Evaluation (Signed)
Anesthesia Post Note  Patient: Kathleen Miller  Procedure(s) Performed: LAPAROSCOPIC ASSTED VENTRAL HERNIA REPAIR, Upper Endo (N/A Abdomen)     Patient location during evaluation: PACU Anesthesia Type: General Level of consciousness: awake and alert Pain management: pain level controlled Vital Signs Assessment: post-procedure vital signs reviewed and stable Respiratory status: spontaneous breathing, nonlabored ventilation and respiratory function stable Cardiovascular status: blood pressure returned to baseline and stable Postop Assessment: no apparent nausea or vomiting Anesthetic complications: no    Last Vitals:  Vitals:   12/13/17 1330 12/13/17 1340  BP: (!) 116/57   Pulse: 82 78  Resp: 12 11  Temp:    SpO2: 92% 94%    Last Pain:  Vitals:   12/13/17 1340  TempSrc:   PainSc: [place]

## 2017-12-13 NOTE — Anesthesia Procedure Notes (Addendum)
Procedure Name: Intubation Date/Time: 12/13/2017 7:41 AM Performed by: West Pugh, CRNA Pre-anesthesia Checklist: Patient identified, Emergency Drugs available, Suction available, Patient being monitored and Timeout performed Patient Re-evaluated:Patient Re-evaluated prior to induction Oxygen Delivery Method: Circle system utilized Preoxygenation: Pre-oxygenation with 100% oxygen Induction Type: IV induction Ventilation: Mask ventilation without difficulty and Oral airway inserted - appropriate to patient size Laryngoscope Size: Mac and 3 Grade View: Grade I Tube type: Oral Tube size: 7.5 mm Number of attempts: 1 Airway Equipment and Method: Stylet Placement Confirmation: ETT inserted through vocal cords under direct vision,  positive ETCO2,  CO2 detector and breath sounds checked- equal and bilateral Secured at: 22 cm Tube secured with: Tape Dental Injury: Teeth and Oropharynx as per pre-operative assessment

## 2017-12-14 ENCOUNTER — Encounter (HOSPITAL_COMMUNITY): Payer: Self-pay | Admitting: General Surgery

## 2017-12-14 LAB — CBC
HEMATOCRIT: 34.2 % — AB (ref 36.0–46.0)
Hemoglobin: 10.8 g/dL — ABNORMAL LOW (ref 12.0–15.0)
MCH: 26.4 pg (ref 26.0–34.0)
MCHC: 31.6 g/dL (ref 30.0–36.0)
MCV: 83.6 fL (ref 78.0–100.0)
Platelets: 476 10*3/uL — ABNORMAL HIGH (ref 150–400)
RBC: 4.09 MIL/uL (ref 3.87–5.11)
RDW: 15.1 % (ref 11.5–15.5)
WBC: 16.2 10*3/uL — AB (ref 4.0–10.5)

## 2017-12-14 LAB — BASIC METABOLIC PANEL
ANION GAP: 9 (ref 5–15)
BUN: 12 mg/dL (ref 6–20)
CALCIUM: 8.7 mg/dL — AB (ref 8.9–10.3)
CO2: 26 mmol/L (ref 22–32)
CREATININE: 0.72 mg/dL (ref 0.44–1.00)
Chloride: 100 mmol/L — ABNORMAL LOW (ref 101–111)
GFR calc Af Amer: >60 mL/min (ref >60)
GLUCOSE: 107 mg/dL — AB (ref 65–99)
Potassium: 4.4 mmol/L (ref 3.5–5.1)
Sodium: 135 mmol/L (ref 135–145)

## 2017-12-14 LAB — GLUCOSE, CAPILLARY
Glucose-Capillary: 101 mg/dL — ABNORMAL HIGH (ref 65–99)
Glucose-Capillary: 127 mg/dL — ABNORMAL HIGH (ref 65–99)

## 2017-12-14 MED ORDER — TRAMADOL HCL 50 MG PO TABS: 50.0000 mg | ORAL_TABLET | Freq: Four times a day (QID) | ORAL | 0 refills | Status: DC | PRN

## 2017-12-14 NOTE — Progress Notes (Signed)
Pt alert, oriented, tolerating diet, ambulating to bathroom.  D/C instructions and prescription was given. All questions answered.

## 2017-12-14 NOTE — Discharge Instructions (Signed)
[facility]      [facility] [phone]  OPEN ABDOMINAL SURGERY: POST OP INSTRUCTIONS  Always review your discharge instruction sheet given to you by the facility where your surgery was performed.  IF YOU HAVE DISABILITY OR FAMILY LEAVE FORMS, YOU MUST BRING THEM TO THE OFFICE FOR PROCESSING.  PLEASE DO NOT GIVE THEM TO YOUR DOCTOR.  1. A prescription for pain medication may be given to you upon discharge.  Take your pain medication as prescribed, if needed.  If narcotic pain medicine is not needed, then you may take acetaminophen (Tylenol) or ibuprofen (Advil) as needed. 2. Take your usually prescribed medications unless otherwise directed. 3. If you need a refill on your pain medication, please contact your pharmacy. They will contact our office to request authorization.  Prescriptions will not be filled after 5pm or on week-ends. 4. You should follow a light diet the first few days after arrival home, such as soup and crackers, pudding, etc.unless your doctor has advised otherwise. A high-fiber, low fat diet can be resumed as tolerated.   Be sure to include lots of fluids daily. Most patients will experience some swelling and bruising on the chest and neck area.  Ice packs will help.  Swelling and bruising can take several days to resolve 5. Most patients will experience some swelling and bruising in the area of the incision. Ice pack will help. Swelling and bruising can take several days to resolve..  6. It is common to experience some constipation if taking pain medication after surgery.  Increasing fluid intake and taking a stool softener will usually help or prevent this problem from occurring.  A mild laxative (Milk of Magnesia or Miralax) should be taken according to package directions if there are no bowel movements after 48 hours. 7.  You may have steri-strips (small skin tapes) in place directly over the incision.  These strips should be left on the skin for 7-10 days.  If your  surgeon used skin glue on the incision, you may shower in 24 hours.  The glue will flake off over the next 2-3 weeks.  Any sutures or staples will be removed at the office during your follow-up visit. You may find that a light gauze bandage over your incision may keep your staples from being rubbed or pulled. You may shower and replace the bandage daily. 8. ACTIVITIES:  You may resume regular (light) daily activities beginning the next day--such as daily self-care, walking, climbing stairs--gradually increasing activities as tolerated.  You may have sexual intercourse when it is comfortable.  Refrain from any heavy lifting or straining until approved by your doctor. a. You may drive when you no longer are taking prescription pain medication, you can comfortably wear a seatbelt, and you can safely maneuver your car and apply brakes b. Return to Work: ___________________________________ 9. You should see your doctor in the office for a follow-up appointment approximately two weeks after your surgery.  Make sure that you call for this appointment within a day or two after you arrive home to insure a convenient appointment time. OTHER INSTRUCTIONS:  _____________________________________________________________ _____________________________________________________________  WHEN TO CALL YOUR DOCTOR: 1. Fever over 101.0 2. Inability to urinate 3. Nausea and/or vomiting 4. Extreme swelling or bruising 5. Continued bleeding from incision. 6. Increased pain, redness, or drainage from the incision. 7. Difficulty swallowing or breathing 8. Muscle cramping or spasms. 9. Numbness or tingling in hands or feet or around lips.  The clinic staff is available to   answer your questions during regular business hours.  Please don't hesitate to call and ask to speak to one of the nurses if you have concerns.  For further questions, please visit [email or web address]   

## 2017-12-14 NOTE — Discharge Summary (Signed)
Patient ID: Kathleen Miller [id number] 71 y.o. [birth year]  12/13/2017  Discharge date and time: 12/14/2017   Admitting Physician: Edward Jolly  Discharge Physician: Edward Jolly  Admission Diagnoses: Morbid Obesity, ventral incisional hernia  Discharge Diagnoses: Same  Operations: Procedure(s): LAPAROSCOPIC ASSTED VENTRAL HERNIA REPAIR, Upper Endo  Admission Condition: fair  Discharged Condition: fair  Indication for Admission: Patient is a 72 year old female with a history of open Roux-en-Y gastric bypass in the 67s in [place].  She presents with a markedly symptomatic periumbilical incisional hernia.  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  Upper Endo has shown similar findings without ulceration.  This is asymptomatic at may have resulted in some weight gain.  After extensive preoperative workup including cardiac clearance and extensive discussion we have elected to proceed with laparoscopic examination of her bypass and if feasible and safe to divide her gastric gastric fistula and to proceed with ventral incisional hernia repair.  Hospital Course: The morning of admission the patient underwent extensive laparoscopic adhesiolysis.  We were able to get up to and evaluate her gastric bypass but there was extensive scarring with the jejunum markedly adherent to the gastric staple line and it was felt that a complete resection and revision would be necessary to take down the fistula and therefore as previously planned this was not done.  She then underwent a laparoscopic-assisted repair of her incisional hernia.  Postoperatively she did well.  On the first postop day she has moderate pain but is up out of bed.  Tolerating diet.  Dressings dry and abdomen is benign.  She is anxious to go home and plan is for discharge later today.   Disposition: Home  Patient Instructions:  Allergies as of 12/14/2017      Reactions    Nsaids Swelling, Other (See Comments)   Severe swelling--water retention (including water on brain)   Tolmetin Swelling, Other (See Comments)   Severe swelling-water retention (including water on brain)      Medication List    STOP taking these medications   naproxen sodium 220 MG tablet Commonly known as:  ALEVE     TAKE these medications   aspirin EC 81 MG tablet Take 1 tablet (81 mg total) by mouth daily. What changed:  when to take this   cyclobenzaprine 10 MG tablet Commonly known as:  FLEXERIL Take 10 mg by mouth 3 (three) times daily as needed for muscle spasms.   furosemide 40 MG tablet Commonly known as:  LASIX Take 1 tablet (40 mg total) by mouth daily. What changed:    when to take this  additional instructions   lovastatin 20 MG tablet Commonly known as:  MEVACOR Take 1 tablet (20 mg total) by mouth at bedtime.   metFORMIN 500 MG tablet Commonly known as:  GLUCOPHAGE Take 1 tablet (500 mg total) by mouth 2 (two) times daily with a meal.   NP THYROID 60 MG tablet Generic drug:  thyroid Take 60 mg by mouth daily before breakfast.   traMADol 50 MG tablet Commonly known as:  ULTRAM Take 1 tablet (50 mg total) by mouth every 6 (six) hours as needed for moderate pain.   vitamin C 1000 MG tablet Take 1,000 mg by mouth daily.       Activity: no heavy lifting for 4 weeks Diet: diabetic diet Wound Care: May shower starting 3 days after surgery  Follow-up:  With Dr. Excell Seltzer in 2 weeks.  Signed: Darene Lamer  Ancil Dewan MD, FACS  12/14/2017, 8:39 AM

## 2017-12-14 NOTE — Progress Notes (Signed)
Patient ID: Kathleen Miller, female   DOB: 06/19/1946, 72 y.o.   MRN: [id number] 1 Day Post-Op   Subjective: Up out of bed.  Moderate pain but adequately controlled with oral meds.  Tolerating diet without nausea.  Anxious to go home today.  Objective: Vital signs in last 24 hours: Temp:  [97.2 F (36.2 C)-98.5 F (36.9 C)] 98.5 F (36.9 C) (01/16 0519) Pulse Rate:  [67-87] 69 (01/16 0519) Resp:  [10-19] 18 (01/16 0519) BP: (110-132)/(55-99) 122/60 (01/16 0519) SpO2:  [91 %-98 %] 97 % (01/16 0519)    Intake/Output from previous day: 01/15 0701 - 01/16 0700 In: 3437.5 [P.O.:400; I.V.:3037.5] Out: 1775 [JQGBE:0100] Intake/Output this shift: No intake/output data recorded.  General appearance: alert, cooperative and no distress GI: Soft and nontender Incision/Wound: Dressing clean and dry  Lab Results:  Recent Labs    12/12/17 1352 12/14/17 0450  WBC 10.6* 16.2*  HGB 12.2 10.8*  HCT 38.8 34.2*  PLT 564* 476*   BMET Recent Labs    12/12/17 1352 12/14/17 0450  NA 138 135  K 4.7 4.4  CL 102 100*  CO2 31 26  GLUCOSE 96 107*  BUN 10 12  CREATININE 0.80 0.72  CALCIUM 9.2 8.7*     Studies/Results: No results found.  Anti-infectives: Anti-infectives (From admission, onward)   Start     Dose/Rate Route Frequency Ordered Stop   12/13/17 1142  cefOXitin (MEFOXIN) 2 g in dextrose 5 % 50 mL IVPB  Status:  Discontinued     2 g 100 mL/hr over 30 Minutes Intravenous  Once 12/13/17 1130 12/14/17 0756   12/13/17 0930  cefOXitin (MEFOXIN) 2 g in dextrose 5 % 50 mL IVPB  Status:  Discontinued     2 g 100 mL/hr over 30 Minutes Intravenous  Once 12/13/17 0921 12/13/17 1612   12/13/17 0615  cefOXitin (MEFOXIN) 2 g in dextrose 5 % 50 mL IVPB     2 g 100 mL/hr over 30 Minutes Intravenous  Once 12/13/17 7121 12/13/17 1227      Assessment/Plan: s/p Procedure(s): LAPAROSCOPIC ASSTED VENTRAL HERNIA REPAIR, Upper Endo Doing well postoperatively without complication.  Some  leukocytosis but patient is status post splenectomy.  Should be okay for discharge today.   LOS: 1 day    Edward Jolly 12/14/2017

## 2017-12-16 ENCOUNTER — Telehealth: Payer: Self-pay

## 2017-12-16 NOTE — Telephone Encounter (Signed)
12/16/17  TCM Hospital Follow Up  Transition Care Management Follow-up Telephone Call  ADMISSION DATE: 12/13/2017  DISCHARGE DATE: 12/14/2017   How have you been since you were released from the hospital? Patient states she has had dizziness and pain.   Do you understand why you were in the hospital? Yes   Do you understand the discharge instrcutions?  Has had help with instructions from friends and neighbors.    Items Reviewed:  Medications reviewed: Yes, patient is only taking pain meds and muscle relaxant. States this is what Dr. Lavell Anchors her to take at discharge,    Allergies reviewed: Yes   Dietary changes reviewed: Diabetic diet ordered. Patient states she is not following Diabetic diet.    Referrals reviewed: Appointment scheduled with Dr. Lorelei Pont  12/26/17   Functional Questionnaire:   Activities of Daily Living (ADLs): Patient states she has to have help with ADL's at this time.  Any patient concerns? Poor appetite and pain continues.   Confirmed importance and date/time of follow-up visits scheduled:yes   Confirmed with patient if condition begins to worsen call PCP or go to the ER. Yes    Patient was given the office number and encouragred to call back with questions or concerns. Yes

## 2017-12-17 ENCOUNTER — Telehealth: Payer: Self-pay | Admitting: Surgery

## 2017-12-17 ENCOUNTER — Inpatient Hospital Stay (HOSPITAL_COMMUNITY)
Admission: EM | Admit: 2017-12-17 | Discharge: 2017-12-21 | DRG: 175 | Disposition: A | Discharge status: Home Health | Payer: Medicare HMO | Attending: Family Medicine | Admitting: Family Medicine

## 2017-12-17 ENCOUNTER — Emergency Department (HOSPITAL_COMMUNITY): Payer: Medicare HMO

## 2017-12-17 ENCOUNTER — Encounter (HOSPITAL_COMMUNITY): Payer: Self-pay

## 2017-12-17 DIAGNOSIS — Z9884 Bariatric surgery status: Secondary | ICD-10-CM | POA: Diagnosis not present

## 2017-12-17 DIAGNOSIS — E871 Hypo-osmolality and hyponatremia: Secondary | ICD-10-CM

## 2017-12-17 DIAGNOSIS — D72829 Elevated white blood cell count, unspecified: Secondary | ICD-10-CM | POA: Diagnosis present

## 2017-12-17 DIAGNOSIS — N189 Chronic kidney disease, unspecified: Secondary | ICD-10-CM | POA: Diagnosis present

## 2017-12-17 DIAGNOSIS — I2781 Cor pulmonale (chronic): Secondary | ICD-10-CM | POA: Diagnosis present

## 2017-12-17 DIAGNOSIS — I5042 Chronic combined systolic (congestive) and diastolic (congestive) heart failure: Secondary | ICD-10-CM | POA: Diagnosis not present

## 2017-12-17 DIAGNOSIS — F419 Anxiety disorder, unspecified: Secondary | ICD-10-CM | POA: Diagnosis present

## 2017-12-17 DIAGNOSIS — Z79899 Other long term (current) drug therapy: Secondary | ICD-10-CM | POA: Diagnosis not present

## 2017-12-17 DIAGNOSIS — I5041 Acute combined systolic (congestive) and diastolic (congestive) heart failure: Secondary | ICD-10-CM | POA: Diagnosis not present

## 2017-12-17 DIAGNOSIS — I5032 Chronic diastolic (congestive) heart failure: Secondary | ICD-10-CM | POA: Diagnosis not present

## 2017-12-17 DIAGNOSIS — I21A1 Myocardial infarction type 2: Secondary | ICD-10-CM | POA: Diagnosis not present

## 2017-12-17 DIAGNOSIS — Z9049 Acquired absence of other specified parts of digestive tract: Secondary | ICD-10-CM | POA: Diagnosis not present

## 2017-12-17 DIAGNOSIS — E1122 Type 2 diabetes mellitus with diabetic chronic kidney disease: Secondary | ICD-10-CM | POA: Diagnosis not present

## 2017-12-17 DIAGNOSIS — I82441 Acute embolism and thrombosis of right tibial vein: Secondary | ICD-10-CM | POA: Diagnosis not present

## 2017-12-17 DIAGNOSIS — I824Z1 Acute embolism and thrombosis of unspecified deep veins of right distal lower extremity: Secondary | ICD-10-CM | POA: Diagnosis present

## 2017-12-17 DIAGNOSIS — N179 Acute kidney failure, unspecified: Secondary | ICD-10-CM | POA: Diagnosis present

## 2017-12-17 DIAGNOSIS — I361 Nonrheumatic tricuspid (valve) insufficiency: Secondary | ICD-10-CM | POA: Diagnosis not present

## 2017-12-17 DIAGNOSIS — R079 Chest pain, unspecified: Secondary | ICD-10-CM | POA: Diagnosis not present

## 2017-12-17 DIAGNOSIS — M549 Dorsalgia, unspecified: Secondary | ICD-10-CM | POA: Diagnosis present

## 2017-12-17 DIAGNOSIS — I2699 Other pulmonary embolism without acute cor pulmonale: Secondary | ICD-10-CM | POA: Diagnosis present

## 2017-12-17 DIAGNOSIS — Z7984 Long term (current) use of oral hypoglycemic drugs: Secondary | ICD-10-CM

## 2017-12-17 DIAGNOSIS — G8929 Other chronic pain: Secondary | ICD-10-CM | POA: Diagnosis present

## 2017-12-17 DIAGNOSIS — Z90710 Acquired absence of both cervix and uterus: Secondary | ICD-10-CM

## 2017-12-17 DIAGNOSIS — E039 Hypothyroidism, unspecified: Secondary | ICD-10-CM | POA: Diagnosis present

## 2017-12-17 DIAGNOSIS — E118 Type 2 diabetes mellitus with unspecified complications: Secondary | ICD-10-CM | POA: Diagnosis not present

## 2017-12-17 DIAGNOSIS — J80 Acute respiratory distress syndrome: Secondary | ICD-10-CM | POA: Diagnosis not present

## 2017-12-17 DIAGNOSIS — Z7982 Long term (current) use of aspirin: Secondary | ICD-10-CM

## 2017-12-17 DIAGNOSIS — E119 Type 2 diabetes mellitus without complications: Secondary | ICD-10-CM

## 2017-12-17 DIAGNOSIS — E785 Hyperlipidemia, unspecified: Secondary | ICD-10-CM | POA: Diagnosis present

## 2017-12-17 DIAGNOSIS — L8990 Pressure ulcer of unspecified site, unspecified stage: Secondary | ICD-10-CM

## 2017-12-17 DIAGNOSIS — R0602 Shortness of breath: Secondary | ICD-10-CM | POA: Diagnosis not present

## 2017-12-17 DIAGNOSIS — I2609 Other pulmonary embolism with acute cor pulmonale: Secondary | ICD-10-CM | POA: Diagnosis not present

## 2017-12-17 LAB — BASIC METABOLIC PANEL
ANION GAP: 16 — AB (ref 5–15)
BUN: 29 mg/dL — ABNORMAL HIGH (ref 6–20)
CHLORIDE: 97 mmol/L — AB (ref 101–111)
CO2: 20 mmol/L — ABNORMAL LOW (ref 22–32)
Calcium: 8.6 mg/dL — ABNORMAL LOW (ref 8.9–10.3)
Creatinine, Ser: 1.49 mg/dL — ABNORMAL HIGH (ref 0.44–1.00)
GFR calc non Af Amer: 35 mL/min — ABNORMAL LOW (ref >60)
GFR, EST AFRICAN AMERICAN: 40 mL/min — AB (ref >60)
Glucose, Bld: 156 mg/dL — ABNORMAL HIGH (ref 65–99)
POTASSIUM: 4.3 mmol/L (ref 3.5–5.1)
SODIUM: 133 mmol/L — AB (ref 135–145)

## 2017-12-17 LAB — I-STAT TROPONIN, ED
TROPONIN I, POC: 0.27 ng/mL — AB (ref 0.00–0.08)
Troponin i, poc: 0.2 ng/mL (ref 0.00–0.08)

## 2017-12-17 LAB — HEMOGLOBIN A1C
HEMOGLOBIN A1C: 6.1 % — AB (ref 4.8–5.6)
MEAN PLASMA GLUCOSE: 128.37 mg/dL

## 2017-12-17 LAB — TROPONIN I: TROPONIN I: 0.35 ng/mL — AB (ref <0.03)

## 2017-12-17 LAB — CBC
HEMATOCRIT: 38.3 % (ref 36.0–46.0)
Hemoglobin: 12.5 g/dL (ref 12.0–15.0)
MCH: 26.7 pg (ref 26.0–34.0)
MCHC: 32.6 g/dL (ref 30.0–36.0)
MCV: 81.7 fL (ref 78.0–100.0)
Platelets: 351 10*3/uL (ref 150–400)
RBC: 4.69 MIL/uL (ref 3.87–5.11)
RDW: 14.7 % (ref 11.5–15.5)
WBC: 22.3 10*3/uL — AB (ref 4.0–10.5)

## 2017-12-17 LAB — CBG MONITORING, ED: Glucose-Capillary: 163 mg/dL — ABNORMAL HIGH (ref 65–99)

## 2017-12-17 MED ORDER — THYROID 60 MG PO TABS
60.0000 mg | ORAL_TABLET | Freq: Every day | ORAL | Status: DC
  Administered 2017-12-18 – 2017-12-21 (×4): 60 mg via ORAL
  Filled 2017-12-17 (×7): qty 1

## 2017-12-17 MED ORDER — IOPAMIDOL (ISOVUE-370) INJECTION 76%
INTRAVENOUS | Status: AC
  Administered 2017-12-17: 60 mL
  Filled 2017-12-17: qty 100

## 2017-12-17 MED ORDER — SODIUM CHLORIDE 0.9 % IV SOLN
INTRAVENOUS | Status: AC
  Administered 2017-12-17: 22:00:00 via INTRAVENOUS

## 2017-12-17 MED ORDER — ACETAMINOPHEN 325 MG PO TABS
650.0000 mg | ORAL_TABLET | Freq: Four times a day (QID) | ORAL | Status: DC | PRN
  Administered 2017-12-19 – 2017-12-20 (×3): 650 mg via ORAL
  Filled 2017-12-17 (×3): qty 2

## 2017-12-17 MED ORDER — HEPARIN BOLUS VIA INFUSION
4000.0000 [IU] | Freq: Once | INTRAVENOUS | Status: AC
  Administered 2017-12-17: 4000 [IU] via INTRAVENOUS
  Filled 2017-12-17: qty 4000

## 2017-12-17 MED ORDER — TRAMADOL HCL 50 MG PO TABS
50.0000 mg | ORAL_TABLET | Freq: Four times a day (QID) | ORAL | Status: DC | PRN
  Administered 2017-12-17 – 2017-12-21 (×8): 50 mg via ORAL
  Filled 2017-12-17 (×9): qty 1

## 2017-12-17 MED ORDER — ACETAMINOPHEN 650 MG RE SUPP: 650.0000 mg | Freq: Four times a day (QID) | RECTAL | Status: DC | PRN

## 2017-12-17 MED ORDER — ONDANSETRON HCL 4 MG/2ML IJ SOLN
4.0000 mg | Freq: Once | INTRAMUSCULAR | Status: AC
  Administered 2017-12-17: 4 mg via INTRAVENOUS
  Filled 2017-12-17: qty 2

## 2017-12-17 MED ORDER — CYCLOBENZAPRINE HCL 10 MG PO TABS
10.0000 mg | ORAL_TABLET | Freq: Three times a day (TID) | ORAL | Status: DC | PRN
  Administered 2017-12-17 – 2017-12-21 (×10): 10 mg via ORAL
  Filled 2017-12-17 (×11): qty 1

## 2017-12-17 MED ORDER — HEPARIN (PORCINE) IN NACL 100-0.45 UNIT/ML-% IJ SOLN
1550.0000 [IU]/h | INTRAMUSCULAR | Status: DC
  Administered 2017-12-17 – 2017-12-18 (×2): 1400 [IU]/h via INTRAVENOUS
  Administered 2017-12-19 – 2017-12-20 (×3): 1550 [IU]/h via INTRAVENOUS
  Filled 2017-12-17 (×9): qty 250

## 2017-12-17 MED ORDER — INSULIN ASPART 100 UNIT/ML ~~LOC~~ SOLN
0.0000 [IU] | Freq: Three times a day (TID) | SUBCUTANEOUS | Status: DC
  Administered 2017-12-18: 2 [IU] via SUBCUTANEOUS
  Administered 2017-12-18 – 2017-12-19 (×3): 1 [IU] via SUBCUTANEOUS
  Filled 2017-12-17: qty 1

## 2017-12-17 MED ORDER — SODIUM CHLORIDE 0.9 % IV BOLUS (SEPSIS)
500.0000 mL | Freq: Once | INTRAVENOUS | Status: AC
  Administered 2017-12-17: 500 mL via INTRAVENOUS

## 2017-12-17 MED ORDER — HEPARIN SODIUM (PORCINE) 5000 UNIT/ML IJ SOLN
4000.0000 [IU] | Freq: Once | INTRAMUSCULAR | Status: DC
  Filled 2017-12-17: qty 1

## 2017-12-17 MED ORDER — PRAVASTATIN SODIUM 20 MG PO TABS
20.0000 mg | ORAL_TABLET | Freq: Every day | ORAL | Status: DC
  Administered 2017-12-19 – 2017-12-21 (×3): 20 mg via ORAL
  Filled 2017-12-17 (×4): qty 1

## 2017-12-17 MED ORDER — INSULIN ASPART 100 UNIT/ML ~~LOC~~ SOLN: 0.0000 [IU] | Freq: Every day | SUBCUTANEOUS | Status: DC

## 2017-12-17 NOTE — ED Notes (Addendum)
Pt on bedpan for BM 

## 2017-12-17 NOTE — ED Triage Notes (Addendum)
Pt from home with ems c.o CP and SOB onset yesterday. Pt post op 4 days from a hernia surgery. Pt c.o generalized weakness today. Pt 80% on room air, NRB applied and is at 100%. Pt alert upon arrival. Also c.o N/V. 22G R hand.EMS gave 125 solumedrol and 4 zofran en route.  Other VSS

## 2017-12-17 NOTE — ED Notes (Signed)
ED Provider at bedside. 

## 2017-12-17 NOTE — H&P (Signed)
TRH H&P   Patient Demographics:    Kathleen Miller, is a 72 y.o. female  MRN: [id number]   DOB - [birth year]  Admit Date - 12/17/2017  Outpatient Primary MD for the patient is Copland, Gay Filler, MD  Referring MD/NP/PA:  Nat Christen  Outpatient Specialists:   Patient coming from: home  Chief Complaint  Patient presents with  . Shortness of Breath  . Chest Pain      HPI:    Kathleen Miller  is a 72 y.o. female, w ventral hernia, pred-diabetes, hypothyroidism, apparently with recent Roux-n -y gastric bypass by Abran Cantor presents with c/o increase sob since Wednesday.  Pt has had sharp chest pain lasting for a few seconds sometimes with cough (dry) or with breathing.  Pt presented to ED due to dyspnea.      In ED,  Wbc 22.3, Hgb 12.5, Plt 351 Na 133, K 4.3, Bun 29, Creatinine 1.49 Glucose 156  CTA chest  IMPRESSION: 1. Bilateral occlusive pulmonary emboli involving the upper and lower lobes. Emboli involve the proximal segmental pulmonary arteries. 2. Positive for acute PE with CT evidence of right heart strain (RV/LV Ratio = 1.47) consistent with at least submassive (intermediate risk) PE. The presence of right heart strain has been associated with an increased risk of morbidity and mortality. Please activate Code PE by paging [phone].  CT at 105, nl axis, nl axis, Q in 3,   Pt will be admitted for PE,  ARF, and hyponatremia.      Review of systems:    In addition to the HPI above,  No Fever-chills, No Headache, No changes with Vision or hearing, No problems swallowing food or Liquids, No Chest pain, Cough  No Abdominal pain, No Nausea or Vommitting, Bowel movements are regular, No Blood in stool or Urine, No dysuria, No new skin rashes or bruises, No new joints pains-aches,  No new weakness, tingling, numbness in any extremity, No recent  weight gain or loss, No polyuria, polydypsia or polyphagia, No significant Mental Stressors.  A full 10 point Review of Systems was done, except as stated above, all other Review of Systems were negative.   With Past History of the following :    Past Medical History:  Diagnosis Date  . Chronic back pain   . Hypothyroidism   . Pneumonia   . Pre-diabetes    takes metformin preventatively  . Ventral hernia       Past Surgical History:  Procedure Laterality Date  . ABDOMINAL HYSTERECTOMY  11/2011  . CHOLECYSTECTOMY  1988  . COLONOSCOPY WITH PROPOFOL N/A 05/27/2016   Procedure: COLONOSCOPY WITH PROPOFOL;  Surgeon: Wilford Corner, MD;  Location: [facility];  Service: Endoscopy;  Laterality: N/A;  . DILATION AND CURETTAGE OF UTERUS     x2  . ESOPHAGOGASTRODUODENOSCOPY (EGD) WITH PROPOFOL N/A 05/27/2016   Procedure: ESOPHAGOGASTRODUODENOSCOPY (EGD) WITH PROPOFOL;  Surgeon: Wilford Corner, MD;  Location: [facility];  Service: Endoscopy;  Laterality: N/A;  . GASTRIC BYPASS    . GASTRIC ROUX-EN-Y N/A 12/13/2017   Procedure: LAPAROSCOPIC ASSTED VENTRAL HERNIA REPAIR, Upper Endo;  Surgeon: Excell Seltzer, MD;  Location: [facility];  Service: General;  Laterality: N/A;  With MESH  . LEFT HEART CATH AND CORONARY ANGIOGRAPHY N/A 08/05/2017   Procedure: LEFT HEART CATH AND CORONARY ANGIOGRAPHY;  Surgeon: Martinique, Peter M, MD;  Location: [facility];  Service: Cardiovascular;  Laterality: N/A;  . revision gastric bypass     and ventrel hernia repair  Dr. Excell Seltzer 12-13-17  . SPLENECTOMY, TOTAL  1988      Social History:     Social History   Tobacco Use  . Smoking status: Never Smoker  . Smokeless tobacco: Never Used  Substance Use Topics  . Alcohol use: No     Lives - at home  Mobility - walks by self   Family History :    History reviewed. No pertinent family history.    Home Medications:   Prior to Admission medications   Medication Sig Start Date End Date  Taking? Authorizing Provider  Ascorbic Acid (VITAMIN C) 1000 MG tablet Take 1,000 mg by mouth daily.    [provider]  aspirin EC 81 MG tablet Take 1 tablet (81 mg total) by mouth daily. Patient taking differently: Take 81 mg by mouth once a week.  11/26/14   Tresa Garter, MD  cyclobenzaprine (FLEXERIL) 10 MG tablet Take 10 mg by mouth 3 (three) times daily as needed for muscle spasms.    [provider]  furosemide (LASIX) 40 MG tablet Take 1 tablet (40 mg total) by mouth daily. Patient taking differently: Take 40 mg by mouth See admin instructions. Take 40 mg by mouth daily on Saturday and Sunday 01/26/17   Copland, Gay Filler, MD  lovastatin (MEVACOR) 20 MG tablet Take 1 tablet (20 mg total) by mouth at bedtime. 11/11/16   Copland, Gay Filler, MD  metFORMIN (GLUCOPHAGE) 500 MG tablet Take 1 tablet (500 mg total) by mouth 2 (two) times daily with a meal. 08/05/17   Martinique, Peter M, MD  thyroid (NP THYROID) 60 MG tablet Take 60 mg by mouth daily before breakfast.    [provider]  traMADol (ULTRAM) 50 MG tablet Take 1 tablet (50 mg total) by mouth every 6 (six) hours as needed for moderate pain. 12/14/17   Excell Seltzer, MD     Allergies:     Allergies  Allergen Reactions  . Nsaids Swelling and Other (See Comments)    Severe swelling--water retention (including water on brain)  . Tolmetin Swelling and Other (See Comments)    Severe swelling-water retention (including water on brain)      Physical Exam:   Vitals  Blood pressure 94/82, pulse 88, temperature (!) 97.4 F (36.3 C), temperature source Oral, resp. rate (!) 35, height 5\' 4"  (1.626 m), weight 120.7 kg (266 lb), SpO2 100 %.   1. General  lying in bed in NAD,    2. Normal affect and insight, Not Suicidal or Homicidal, Awake Alert, Oriented X 3.  3. No F.N deficits, ALL C.Nerves Intact, Strength 5/5 all 4 extremities, Sensation intact all 4 extremities, Plantars down going.  4. Ears  and Eyes appear Normal, Conjunctivae clear, PERRLA. Moist Oral Mucosa.  5. Supple Neck, No JVD, No cervical lymphadenopathy appriciated, No Carotid Bruits.  6. Symmetrical Chest wall movement, Good  air movement bilaterally, CTAB.  7. RRR, No Gallops, Rubs or Murmurs, No Parasternal Heave.  8. Positive Bowel Sounds, Abdomen Soft, No tenderness, No organomegaly appriciated,No rebound -guarding or rigidity.  9.  No Cyanosis, Normal Skin Turgor, No Skin Rash or Bruise.  10. Good muscle tone,  joints appear normal , no effusions, Normal ROM.  11. No Palpable Lymph Nodes in Neck or Axillae     Data Review:    CBC Recent Labs  Lab 12/12/17 1352 12/14/17 0450 12/17/17 1602  WBC 10.6* 16.2* 22.3*  HGB 12.2 10.8* 12.5  HCT 38.8 34.2* 38.3  PLT 564* 476* 351  MCV 84.2 83.6 81.7  MCH 26.5 26.4 26.7  MCHC 31.4 31.6 32.6  RDW 14.8 15.1 14.7   ------------------------------------------------------------------------------------------------------------------  Chemistries  Recent Labs  Lab 12/12/17 1352 12/14/17 0450 12/17/17 1602  NA 138 135 133*  K 4.7 4.4 4.3  CL 102 100* 97*  CO2 31 26 20*  GLUCOSE 96 107* 156*  BUN 10 12 29*  CREATININE 0.80 0.72 1.49*  CALCIUM 9.2 8.7* 8.6*   ------------------------------------------------------------------------------------------------------------------ estimated creatinine clearance is 45.6 mL/min (A) (by C-G formula based on SCr of 1.49 mg/dL (H)). ------------------------------------------------------------------------------------------------------------------ No results for input(s): TSH, T4TOTAL, T3FREE, THYROIDAB in the last 72 hours.  Invalid input(s): FREET3  Coagulation profile No results for input(s): INR, PROTIME in the last 168 hours. ------------------------------------------------------------------------------------------------------------------- No results for input(s): DDIMER in the last 72  hours. -------------------------------------------------------------------------------------------------------------------  Cardiac Enzymes No results for input(s): CKMB, TROPONINI, MYOGLOBIN in the last 168 hours.  Invalid input(s): CK ------------------------------------------------------------------------------------------------------------------ No results found for: BNP   ---------------------------------------------------------------------------------------------------------------  Urinalysis    Component Value Date/Time   COLORURINE YELLOW 05/10/2016 1022   APPEARANCEUR CLOUDY (A) 05/10/2016 1022   LABSPEC 1.027 05/10/2016 1022   PHURINE 5.5 05/10/2016 1022   GLUCOSEU NEGATIVE 05/10/2016 1022   HGBUR NEGATIVE 05/10/2016 1022   BILIRUBINUR NEGATIVE 05/10/2016 1022   KETONESUR NEGATIVE 05/10/2016 1022   PROTEINUR NEGATIVE 05/10/2016 1022   NITRITE NEGATIVE 05/10/2016 1022   LEUKOCYTESUR NEGATIVE 05/10/2016 1022    ----------------------------------------------------------------------------------------------------------------   Imaging Results:    Ct Angio Chest Pe W And/or Wo Contrast  Result Date: 12/17/2017 CLINICAL DATA:  Recent hernia repair. Last night pt become weak and short of breath. Shortness of breath has worsened today. ^26mL ISOVUE-370 IOPAMIDOL (ISOVUE-370) INJECTION 76% EXAM: CT ANGIOGRAPHY CHEST WITH CONTRAST TECHNIQUE: Multidetector CT imaging of the chest was performed using the standard protocol during bolus administration of intravenous contrast. Multiplanar CT image reconstructions and MIPs were obtained to evaluate the vascular anatomy. CONTRAST:  79mL ISOVUE-370 IOPAMIDOL (ISOVUE-370) INJECTION 76% COMPARISON:  Chest CT 12/17/2017 FINDINGS: Cardiovascular: There is a filling defect within proximal RIGHT upper lobe pulmonary artery which is occlusive (image 50, series 5). Filling defect within the RIGHT middle lobe pulmonary artery which is proximal.  Segmental filling defects within the lower lobe pulmonary arteries on the RIGHT. Filling defects within the segmental pulmonary arteries in the LEFT lower lobe which are occlusive. Lingular filling defect distally. Probable upper lobe pulmonary filling defects. Findings consistent with bilateral multiple pulmonary emboli. The RIGHT ventricular to LEFT ventricular ratio is equal to 1.47 Mediastinum/Nodes: No axillary supraclavicular adenopathy. No mediastinal hilar adenopathy. No pericardial effusion. Lungs/Pleura: No pulmonary infarction identified. Mild atelectasis the RIGHT lung base. Upper Abdomen: Limited view of the liver, kidneys, pancreas are unremarkable. Normal adrenal glands. Musculoskeletal: No aggressive osseous lesion. Review of the MIP images confirms the above findings. IMPRESSION: 1. Bilateral occlusive pulmonary emboli involving  the upper and lower lobes. Emboli involve the proximal segmental pulmonary arteries. 2. Positive for acute PE with CT evidence of right heart strain (RV/LV Ratio = 1.47) consistent with at least submassive (intermediate risk) PE. The presence of right heart strain has been associated with an increased risk of morbidity and mortality. Please activate Code PE by paging [phone]. Critical Value/emergent results were called by telephone at the time of interpretation on 12/17/2017 at 6:21 pm to Dr. Nat Christen , who verbally acknowledged these results. Electronically Signed   By: Suzy Bouchard M.D.   On: 12/17/2017 18:22   Dg Chest Portable 1 View  Result Date: 12/17/2017 CLINICAL DATA:  Chest pain and shortness of breath EXAM: PORTABLE CHEST 1 VIEW COMPARISON:  May 10, 2016 FINDINGS: Cardiomegaly. The hila and mediastinum are unremarkable given the low volume portable technique. No pulmonary nodules or masses. No focal infiltrates. No overt edema. Continued elevation of the right hemidiaphragm. IMPRESSION: No active disease. Electronically Signed   By: Dorise Bullion  III M.D   On: 12/17/2017 16:31       Assessment & Plan:    Principal Problem:   Pulmonary embolism ([place]) Active Problems:   Diabetes ([place])   Leukocytosis   Hyponatremia   ARF (acute renal failure) ([facility])    PE Tele Trop I q6h x3 Cardiac echo Heparin iv pharmacy to dose  ARF Hydrate with ns iv Check cmp in am  Hyponatremia Hydrate with ns iv Check cmp in am  Dm2  fsbs ac and qhs,  ISS HOLD METFORMIN due to renal insufficiency  Hypothyroidism Cont levothyroxine  Hyperlipidemia Cont mevacor   DVT Prophylaxis Heparin - SCDs  AM Labs Ordered, also please review Full Orders  Family Communication: Admission, patients condition and plan of care including tests being ordered have been discussed with the patient  who indicate understanding and agree with the plan and Code Status.  Code Status FULL CODE  Likely DC to  home  Condition GUARDED   Consults called: none  Admission status: inpatient   Time spent in minutes : 45   Jani Gravel M.D on 12/17/2017 at 7:29 PM  Between 7am to 7pm - Pager - [phone]  . After 7pm go to [email or web address] - password [facility]  [facility] - Office  [phone]

## 2017-12-17 NOTE — ED Notes (Signed)
Pt bladder scanned with 62ml found although pt reports she has not voided all day.

## 2017-12-17 NOTE — ED Notes (Signed)
Patient transported to CT 

## 2017-12-17 NOTE — Telephone Encounter (Signed)
Kathleen Miller had a ventral hernia repair by Dr. Excell Seltzer on 12/13/2017.  She's having some nausea, but no vomiting.  She wants something for nausea.  I called in Zofran, 4 mg, #15 to [facility] on [address].  She'll call back if this gets worse.  Alphonsa Overall, MD, [facility] Surgery Pager: [phone] Office phone:  [phone]

## 2017-12-17 NOTE — ED Notes (Signed)
Pt transferred to hospital bed for comfort.

## 2017-12-17 NOTE — Progress Notes (Signed)
ANTICOAGULATION CONSULT NOTE - Initial Consult  Pharmacy Consult for heparin Indication: pulmonary embolus  Allergies  Allergen Reactions  . Nsaids Swelling and Other (See Comments)    Severe swelling--water retention (including water on brain)  . Tolmetin Swelling and Other (See Comments)    Severe swelling-water retention (including water on brain)     Patient Measurements: Height: 5\' 4"  (162.6 cm) Weight: 266 lb (120.7 kg) IBW/kg (Calculated) : 54.7 Heparin Dosing Weight: 84kg  Vital Signs: Temp: 97.4 F (36.3 C) (01/19 1728) Temp Source: Oral (01/19 1728) BP: 103/69 (01/19 1730) Pulse Rate: 97 (01/19 1730)  Labs: Recent Labs    12/17/17 1602  HGB 12.5  HCT 38.3  PLT 351  CREATININE 1.49*    Estimated Creatinine Clearance: 45.6 mL/min (A) (by C-G formula based on SCr of 1.49 mg/dL (H)).   Medical History: Past Medical History:  Diagnosis Date  . Chronic back pain   . Hypothyroidism   . Pneumonia   . Pre-diabetes    takes metformin preventatively  . Ventral hernia      Assessment: 73 YOF with recent R hernia repair who presents with dyspnea for 24h- CTA positive for PE. Patient is not on anticoagulation PTA.  Baseline hgb 12.5, plts 351.   Goal of Therapy:  Heparin level 0.3-0.7 units/ml Monitor platelets by anticoagulation protocol: Yes   Plan:  Heparin bolus 4000 units IV x1 then start infusion at 1400 units/hr Heparin level in 6 hours Daily heparin level and CBC  Keishaun Hazel D. Hebe Merriwether, PharmD, BCPS Clinical Pharmacist [phone] 12/17/2017 6:53 PM

## 2017-12-17 NOTE — ED Provider Notes (Signed)
[place] EMERGENCY DEPARTMENT Provider Note   CSN: [id number] Arrival date & time: 12/17/17  1546     History   Chief Complaint Chief Complaint  Patient presents with  . Shortness of Breath  . Chest Pain    HPI Kathleen Miller is a 72 y.o. female.  Level 5 caveat for urgent need for intervention.  Patient complains of chest pain and dyspnea since yesterday.  She is status post ventral hernia repair on 12/13/17 by Dr. Excell Seltzer.  Past medical history includes obesity, prediabetes, hypothyroidism.  She is oxygenating well with supplemental oxygen, but desats easily without oxygen      Past Medical History:  Diagnosis Date  . Chronic back pain   . Hypothyroidism   . Pneumonia   . Pre-diabetes    takes metformin preventatively  . Ventral hernia     Patient Active Problem List   Diagnosis Date Noted  . Incisional hernia 12/13/2017  . Chest pain 08/05/2017  . Abnormal nuclear stress test 08/05/2017  . Low bone mass 02/23/2017  . Chronic pain syndrome 07/18/2016  . Dyslipidemia 07/12/2016  . LUQ pain 05/27/2016  . GERD (gastroesophageal reflux disease) 05/27/2016  . Encounter for screening mammogram for breast cancer 11/26/2014  . Slow transit constipation 06/06/2014  . Diabetes ([place]) 12/04/2013  . Chronic combined systolic and diastolic heart failure ([place]) 12/04/2013  . Essential hypertension, benign 12/04/2013  . Spinal stenosis of lumbar region 12/04/2013  . Morbid obesity ([place]) 08/04/2013  . Acute combined systolic and diastolic congestive heart failure ([place]) 08/02/2013  . Type II or unspecified type diabetes mellitus without mention of complication, uncontrolled 08/02/2013    Past Surgical History:  Procedure Laterality Date  . ABDOMINAL HYSTERECTOMY  11/2011  . CHOLECYSTECTOMY  1988  . COLONOSCOPY WITH PROPOFOL N/A 05/27/2016   Procedure: COLONOSCOPY WITH PROPOFOL;  Surgeon: Wilford Corner, MD;  Location: [facility];  Service:  Endoscopy;  Laterality: N/A;  . DILATION AND CURETTAGE OF UTERUS     x2  . ESOPHAGOGASTRODUODENOSCOPY (EGD) WITH PROPOFOL N/A 05/27/2016   Procedure: ESOPHAGOGASTRODUODENOSCOPY (EGD) WITH PROPOFOL;  Surgeon: Wilford Corner, MD;  Location: [facility];  Service: Endoscopy;  Laterality: N/A;  . GASTRIC BYPASS    . GASTRIC ROUX-EN-Y N/A 12/13/2017   Procedure: LAPAROSCOPIC ASSTED VENTRAL HERNIA REPAIR, Upper Endo;  Surgeon: Excell Seltzer, MD;  Location: [facility];  Service: General;  Laterality: N/A;  With MESH  . LEFT HEART CATH AND CORONARY ANGIOGRAPHY N/A 08/05/2017   Procedure: LEFT HEART CATH AND CORONARY ANGIOGRAPHY;  Surgeon: Martinique, Peter M, MD;  Location: [facility];  Service: Cardiovascular;  Laterality: N/A;  . revision gastric bypass     and ventrel hernia repair  Dr. Excell Seltzer 12-13-17  . SPLENECTOMY, TOTAL  1988    OB History    No data available       Home Medications    Prior to Admission medications   Medication Sig Start Date End Date Taking? Authorizing Provider  Ascorbic Acid (VITAMIN C) 1000 MG tablet Take 1,000 mg by mouth daily.    [provider]  aspirin EC 81 MG tablet Take 1 tablet (81 mg total) by mouth daily. Patient taking differently: Take 81 mg by mouth once a week.  11/26/14   Tresa Garter, MD  cyclobenzaprine (FLEXERIL) 10 MG tablet Take 10 mg by mouth 3 (three) times daily as needed for muscle spasms.    [provider]  furosemide (LASIX) 40 MG tablet Take 1  tablet (40 mg total) by mouth daily. Patient taking differently: Take 40 mg by mouth See admin instructions. Take 40 mg by mouth daily on Saturday and Sunday 01/26/17   Copland, Gay Filler, MD  lovastatin (MEVACOR) 20 MG tablet Take 1 tablet (20 mg total) by mouth at bedtime. 11/11/16   Copland, Gay Filler, MD  metFORMIN (GLUCOPHAGE) 500 MG tablet Take 1 tablet (500 mg total) by mouth 2 (two) times daily with a meal. 08/05/17   Martinique, Peter M, MD  thyroid (NP THYROID)  60 MG tablet Take 60 mg by mouth daily before breakfast.    [provider]  traMADol (ULTRAM) 50 MG tablet Take 1 tablet (50 mg total) by mouth every 6 (six) hours as needed for moderate pain. 12/14/17   Excell Seltzer, MD    Family History No family history on file.  Social History Social History   Tobacco Use  . Smoking status: Never Smoker  . Smokeless tobacco: Never Used  Substance Use Topics  . Alcohol use: No  . Drug use: No     Allergies   Nsaids and Tolmetin   Review of Systems Review of Systems  Unable to perform ROS: Acuity of condition     Physical Exam Updated Vital Signs BP 103/69   Pulse 97   Temp (!) 97.4 F (36.3 C) (Oral)   Resp (!) 24   Ht 5\' 4"  (1.626 m)   Wt 120.7 kg (266 lb)   SpO2 99%   BMI 45.66 kg/m   Physical Exam  Constitutional: She is oriented to person, place, and time.  Tachypneic, good color, satting well with supplemental oxygen  HENT:  Head: Normocephalic and atraumatic.  Eyes: Conjunctivae are normal.  Neck: Neck supple.  Cardiovascular: Normal rate and regular rhythm.  Pulmonary/Chest: Effort normal and breath sounds normal.  Abdominal: Soft. Bowel sounds are normal.  Musculoskeletal: Normal range of motion.  Neurological: She is alert and oriented to person, place, and time.  Skin: Skin is warm and dry.  Psychiatric: She has a normal mood and affect. Her behavior is normal.  Nursing note and vitals reviewed.    ED Treatments / Results  Labs (all labs ordered are listed, but only abnormal results are displayed) Labs Reviewed  BASIC METABOLIC PANEL - Abnormal; Notable for the following components:      Result Value   Sodium 133 (*)    Chloride 97 (*)    CO2 20 (*)    Glucose, Bld 156 (*)    BUN 29 (*)    Creatinine, Ser 1.49 (*)    Calcium 8.6 (*)    GFR calc non Af Amer 35 (*)    GFR calc Af Amer 40 (*)    Anion gap 16 (*)    All other components within normal limits  CBC - Abnormal; Notable  for the following components:   WBC 22.3 (*)    All other components within normal limits  I-STAT TROPONIN, ED - Abnormal; Notable for the following components:   Troponin i, poc 0.20 (*)    All other components within normal limits  D-DIMER, QUANTITATIVE (NOT AT [facility])  URINALYSIS, ROUTINE W REFLEX MICROSCOPIC    EKG  EKG Interpretation  Date/Time:  Saturday December 17 2017 15:54:09 EST Ventricular Rate:  106 PR Interval:    QRS Duration: 93 QT Interval:  321 QTC Calculation: 427 R Axis:   2 Text Interpretation:  Sinus tachycardia Low voltage, precordial leads Borderline repolarization abnormality Inferior q  waves Confirmed by Tanna Furry [phone]) on 12/17/2017 3:57:21 PM       Radiology Ct Angio Chest Pe W And/or Wo Contrast  Result Date: 12/17/2017 CLINICAL DATA:  Recent hernia repair. Last night pt become weak and short of breath. Shortness of breath has worsened today. ^71mL ISOVUE-370 IOPAMIDOL (ISOVUE-370) INJECTION 76% EXAM: CT ANGIOGRAPHY CHEST WITH CONTRAST TECHNIQUE: Multidetector CT imaging of the chest was performed using the standard protocol during bolus administration of intravenous contrast. Multiplanar CT image reconstructions and MIPs were obtained to evaluate the vascular anatomy. CONTRAST:  51mL ISOVUE-370 IOPAMIDOL (ISOVUE-370) INJECTION 76% COMPARISON:  Chest CT 12/17/2017 FINDINGS: Cardiovascular: There is a filling defect within proximal RIGHT upper lobe pulmonary artery which is occlusive (image 50, series 5). Filling defect within the RIGHT middle lobe pulmonary artery which is proximal. Segmental filling defects within the lower lobe pulmonary arteries on the RIGHT. Filling defects within the segmental pulmonary arteries in the LEFT lower lobe which are occlusive. Lingular filling defect distally. Probable upper lobe pulmonary filling defects. Findings consistent with bilateral multiple pulmonary emboli. The RIGHT ventricular to LEFT ventricular ratio is equal to  1.47 Mediastinum/Nodes: No axillary supraclavicular adenopathy. No mediastinal hilar adenopathy. No pericardial effusion. Lungs/Pleura: No pulmonary infarction identified. Mild atelectasis the RIGHT lung base. Upper Abdomen: Limited view of the liver, kidneys, pancreas are unremarkable. Normal adrenal glands. Musculoskeletal: No aggressive osseous lesion. Review of the MIP images confirms the above findings. IMPRESSION: 1. Bilateral occlusive pulmonary emboli involving the upper and lower lobes. Emboli involve the proximal segmental pulmonary arteries. 2. Positive for acute PE with CT evidence of right heart strain (RV/LV Ratio = 1.47) consistent with at least submassive (intermediate risk) PE. The presence of right heart strain has been associated with an increased risk of morbidity and mortality. Please activate Code PE by paging [phone]. Critical Value/emergent results were called by telephone at the time of interpretation on 12/17/2017 at 6:21 pm to Dr. Nat Christen , who verbally acknowledged these results. Electronically Signed   By: Suzy Bouchard M.D.   On: 12/17/2017 18:22   Dg Chest Portable 1 View  Result Date: 12/17/2017 CLINICAL DATA:  Chest pain and shortness of breath EXAM: PORTABLE CHEST 1 VIEW COMPARISON:  May 10, 2016 FINDINGS: Cardiomegaly. The hila and mediastinum are unremarkable given the low volume portable technique. No pulmonary nodules or masses. No focal infiltrates. No overt edema. Continued elevation of the right hemidiaphragm. IMPRESSION: No active disease. Electronically Signed   By: Dorise Bullion III M.D   On: 12/17/2017 16:31    Procedures Procedures (including critical care time)  Medications Ordered in ED Medications  heparin injection 4,000 Units (not administered)  ondansetron (ZOFRAN) injection 4 mg (not administered)  sodium chloride 0.9 % bolus 500 mL (0 mLs Intravenous Stopped 12/17/17 1715)  iopamidol (ISOVUE-370) 76 % injection (60 mLs  Contrast Given  12/17/17 1751)     Initial Impression / Assessment and Plan / ED Course  I have reviewed the triage vital signs and the nursing notes.  Pertinent labs & imaging results that were available during my care of the patient were reviewed by me and considered in my medical decision making (see chart for details).     Patient with recent ventral hernia repair presents with dyspnea for 24 hours.  CT angiogram of chest reveals bilateral pulmonary emboli.  EKG reveals sinus tachycardia at 106/min.  Troponin 0 0.20.  Will initiate heparin.  Discussed with critical care.  Admit to general medicine.  CRITICAL CARE Performed by: Nat Christen  ?  Total critical care time: 30 minutes  Critical care time was exclusive of separately billable procedures and treating other patients.  Critical care was necessary to treat or prevent imminent or life-threatening deterioration.  Critical care was time spent personally by me on the following activities: development of treatment plan with patient and/or surrogate as well as nursing, discussions with consultants, evaluation of patient's response to treatment, examination of patient, obtaining history from patient or surrogate, ordering and performing treatments and interventions, ordering and review of laboratory studies, ordering and review of radiographic studies, pulse oximetry and re-evaluation of patient's condition.  Final Clinical Impressions(s) / ED Diagnoses   Final diagnoses:  Bilateral pulmonary embolism [facility])    ED Discharge Orders    None       Nat Christen, MD 12/17/17 [phone]

## 2017-12-17 NOTE — ED Notes (Signed)
Dr. Jeneen Rinks notified of elevated I stat troponin results by B. Yolanda Bonine, EMT

## 2017-12-17 NOTE — ED Notes (Signed)
Pt transferred to venti mask on 10L but desat to 90%, NRB reapplied at 15L and pt oxygen saturations went back up to 98%

## 2017-12-18 DIAGNOSIS — I2609 Other pulmonary embolism with acute cor pulmonale: Secondary | ICD-10-CM

## 2017-12-18 LAB — COMPREHENSIVE METABOLIC PANEL
ALT: 66 U/L — AB (ref 14–54)
ANION GAP: 13 (ref 5–15)
AST: 84 U/L — ABNORMAL HIGH (ref 15–41)
Albumin: 3 g/dL — ABNORMAL LOW (ref 3.5–5.0)
Alkaline Phosphatase: 107 U/L (ref 38–126)
BUN: 34 mg/dL — ABNORMAL HIGH (ref 6–20)
CHLORIDE: 100 mmol/L — AB (ref 101–111)
CO2: 21 mmol/L — ABNORMAL LOW (ref 22–32)
CREATININE: 1.41 mg/dL — AB (ref 0.44–1.00)
Calcium: 8.3 mg/dL — ABNORMAL LOW (ref 8.9–10.3)
GFR, EST AFRICAN AMERICAN: 43 mL/min — AB (ref >60)
GFR, EST NON AFRICAN AMERICAN: 37 mL/min — AB (ref >60)
Glucose, Bld: 165 mg/dL — ABNORMAL HIGH (ref 65–99)
Potassium: 4 mmol/L (ref 3.5–5.1)
Sodium: 134 mmol/L — ABNORMAL LOW (ref 135–145)
Total Bilirubin: 0.9 mg/dL (ref 0.3–1.2)
Total Protein: 6.7 g/dL (ref 6.5–8.1)

## 2017-12-18 LAB — GLUCOSE, CAPILLARY
GLUCOSE-CAPILLARY: 138 mg/dL — AB (ref 65–99)
Glucose-Capillary: 142 mg/dL — ABNORMAL HIGH (ref 65–99)
Glucose-Capillary: 166 mg/dL — ABNORMAL HIGH (ref 65–99)

## 2017-12-18 LAB — CBC
HCT: 34.8 % — ABNORMAL LOW (ref 36.0–46.0)
HCT: 34.5 % — ABNORMAL LOW (ref 36.0–46.0)
HEMOGLOBIN: 11.1 g/dL — AB (ref 12.0–15.0)
Hemoglobin: 11.2 g/dL — ABNORMAL LOW (ref 12.0–15.0)
MCH: 26.1 pg (ref 26.0–34.0)
MCH: 26.1 pg (ref 26.0–34.0)
MCHC: 32.2 g/dL (ref 30.0–36.0)
MCHC: 32.2 g/dL (ref 30.0–36.0)
MCV: 81.1 fL (ref 78.0–100.0)
MCV: 81.2 fL (ref 78.0–100.0)
PLATELETS: 338 10*3/uL (ref 150–400)
Platelets: 329 10*3/uL (ref 150–400)
RBC: 4.29 MIL/uL (ref 3.87–5.11)
RBC: 4.25 MIL/uL (ref 3.87–5.11)
RDW: 14.5 % (ref 11.5–15.5)
RDW: 14.6 % (ref 11.5–15.5)
WBC: 17.7 10*3/uL — AB (ref 4.0–10.5)
WBC: 17.4 10*3/uL — ABNORMAL HIGH (ref 4.0–10.5)

## 2017-12-18 LAB — URINALYSIS, ROUTINE W REFLEX MICROSCOPIC
BILIRUBIN URINE: NEGATIVE
Glucose, UA: NEGATIVE mg/dL
HGB URINE DIPSTICK: NEGATIVE
Ketones, ur: 5 mg/dL — AB
LEUKOCYTES UA: NEGATIVE
Nitrite: NEGATIVE
PROTEIN: 30 mg/dL — AB
RBC / HPF: NONE SEEN RBC/hpf (ref 0–5)
Specific Gravity, Urine: 1.041 — ABNORMAL HIGH (ref 1.005–1.030)
pH: 5 (ref 5.0–8.0)

## 2017-12-18 LAB — HEPARIN LEVEL (UNFRACTIONATED)
HEPARIN UNFRACTIONATED: 0.49 [IU]/mL (ref 0.30–0.70)
HEPARIN UNFRACTIONATED: 0.2 [IU]/mL — AB (ref 0.30–0.70)
HEPARIN UNFRACTIONATED: 0.24 [IU]/mL — AB (ref 0.30–0.70)

## 2017-12-18 LAB — TROPONIN I
Troponin I: 0.37 ng/mL (ref <0.03)
Troponin I: 0.21 ng/mL (ref <0.03)

## 2017-12-18 LAB — MRSA PCR SCREENING: MRSA by PCR: NEGATIVE

## 2017-12-18 LAB — CBG MONITORING, ED: GLUCOSE-CAPILLARY: 153 mg/dL — AB (ref 65–99)

## 2017-12-18 MED ORDER — ORAL CARE MOUTH RINSE
15.0000 mL | Freq: Two times a day (BID) | OROMUCOSAL | Status: DC
  Administered 2017-12-18 – 2017-12-19 (×3): 15 mL via OROMUCOSAL

## 2017-12-18 MED ORDER — SODIUM CHLORIDE 0.9 % IV SOLN
INTRAVENOUS | Status: DC
  Administered 2017-12-18 – 2017-12-19 (×4): via INTRAVENOUS

## 2017-12-18 MED ORDER — HEPARIN BOLUS VIA INFUSION
1000.0000 [IU] | Freq: Once | INTRAVENOUS | Status: AC
  Administered 2017-12-18: 1000 [IU] via INTRAVENOUS
  Filled 2017-12-18: qty 1000

## 2017-12-18 NOTE — Progress Notes (Signed)
[place] for heparin Indication: pulmonary embolus  Allergies  Allergen Reactions  . Nsaids Swelling and Other (See Comments)    Severe swelling--water retention (including water on brain)  . Tolmetin Swelling and Other (See Comments)    Severe swelling-water retention (including water on brain)     Patient Measurements: Height: 5\' 4"  (162.6 cm) Weight: 266 lb (120.7 kg) IBW/kg (Calculated) : 54.7 Heparin Dosing Weight: 84kg  Vital Signs: Temp: 97.6 F (36.4 C) (01/20 1617) Temp Source: Oral (01/20 1617) BP: 116/70 (01/20 1900) Pulse Rate: 80 (01/20 1900)  Labs: Recent Labs    12/17/17 1602 12/17/17 2102 12/18/17 0130 12/18/17 0324 12/18/17 1109 12/18/17 1832  HGB 12.5  --  11.2* 11.1*  --   --   HCT 38.3  --  34.8* 34.5*  --   --   PLT 351  --  329 338  --   --   HEPARINUNFRC  --   --  0.49  --  0.20* 0.24*  CREATININE 1.49*  --   --  1.41*  --   --   TROPONINI  --  0.35*  --  0.37* 0.21*  --     Estimated Creatinine Clearance: 48.2 mL/min (A) (by C-G formula based on SCr of 1.41 mg/dL (H)).  Assessment: 46 YOF with recent R hernia repair who presents with dyspnea for 24h- CTA positive for PE. Currently on IV heparin at 1400 units/hr. HL remains sub-therapeutic at 0.24.   Goal of Therapy:  Heparin level 0.3-0.7 units/ml Monitor platelets by anticoagulation protocol: Yes   Plan:  Heparin 1000 units IV bolus, then increase IV heparin to 1550 units/hr  F/u AM HL  Monitor daily HL, CBC and s/s of bleeding   Albertina Parr, PharmD., BCPS Clinical Pharmacist Pager [phone]

## 2017-12-18 NOTE — ED Notes (Signed)
Patient denies pain and is resting comfortably.  

## 2017-12-18 NOTE — Progress Notes (Signed)
[place] for heparin Indication: pulmonary embolus  Allergies  Allergen Reactions  . Nsaids Swelling and Other (See Comments)    Severe swelling--water retention (including water on brain)  . Tolmetin Swelling and Other (See Comments)    Severe swelling-water retention (including water on brain)     Patient Measurements: Height: 5\' 4"  (162.6 cm) Weight: 266 lb (120.7 kg) IBW/kg (Calculated) : 54.7 Heparin Dosing Weight: 84kg  Vital Signs: Temp: 97.9 F (36.6 C) (01/20 0745) Temp Source: Oral (01/20 0745) BP: 124/51 (01/20 1039) Pulse Rate: 80 (01/20 1039)  Labs: Recent Labs    12/17/17 1602 12/17/17 2102 12/18/17 0130 12/18/17 0324 12/18/17 1109  HGB 12.5  --  11.2* 11.1*  --   HCT 38.3  --  34.8* 34.5*  --   PLT 351  --  329 338  --   HEPARINUNFRC  --   --  0.49  --  0.20*  CREATININE 1.49*  --   --  1.41*  --   TROPONINI  --  0.35*  --  0.37*  --     Estimated Creatinine Clearance: 48.2 mL/min (A) (by C-G formula based on SCr of 1.41 mg/dL (H)).  Assessment: 83 YOF with recent R hernia repair who presents with dyspnea for 24h- CTA positive for PE. Currently on IV heparin at 1400 units/hr. Anti-Xa level was therapeutic earlier today on this rate but on recheck, heparin level is subtherapeutic at 0.2. On speaking with RN, she confirmed that heparin drip briefly ran out for ~ 30 minutes around 10:30 AM which could have resulted in low level. H/H this AM has trended down slightly. Plt wnl.   Goal of Therapy:  Heparin level 0.3-0.7 units/ml Monitor platelets by anticoagulation protocol: Yes   Plan:  Continue IV heparin at 1400 units/hr  F/u 6 hr HL Monitor daily HL, CBC and s/s of bleeding   Albertina Parr, PharmD., BCPS Clinical Pharmacist Pager [phone]

## 2017-12-18 NOTE — Progress Notes (Signed)
This RN went into room to administer pts PO Pravastatin and patient states she does not take this medication, does not know what it is or what it is for and states she has never been diagnosed with high cholesterol. This RN held the medication until confirmation with Dr. Reesa Chew. This RN paged and then spoke with Dr. Reesa Chew and he states it is okay to not give at this time.

## 2017-12-18 NOTE — Progress Notes (Signed)
[place] for heparin Indication: pulmonary embolus  Allergies  Allergen Reactions  . Nsaids Swelling and Other (See Comments)    Severe swelling--water retention (including water on brain)  . Tolmetin Swelling and Other (See Comments)    Severe swelling-water retention (including water on brain)     Patient Measurements: Height: 5\' 4"  (162.6 cm) Weight: 266 lb (120.7 kg) IBW/kg (Calculated) : 54.7 Heparin Dosing Weight: 84kg  Vital Signs: Temp: 97.4 F (36.3 C) (01/19 1728) Temp Source: Oral (01/19 1728) BP: 127/86 (01/20 0200) Pulse Rate: 80 (01/20 0200)  Labs: Recent Labs    12/17/17 1602 12/17/17 2102 12/18/17 0130  HGB 12.5  --  11.2*  HCT 38.3  --  34.8*  PLT 351  --  329  HEPARINUNFRC  --   --  0.49  CREATININE 1.49*  --   --   TROPONINI  --  0.35*  --     Estimated Creatinine Clearance: 45.6 mL/min (A) (by C-G formula based on SCr of 1.49 mg/dL (H)).  Assessment: 72 y.o. female with PE for heparin  Goal of Therapy:  Heparin level 0.3-0.7 units/ml Monitor platelets by anticoagulation protocol: Yes   Plan:  Continue Heparin at current rate  Recheck level later this morning to verify  Phillis Knack, PharmD, BCPS  12/18/2017 3:14 AM

## 2017-12-18 NOTE — Progress Notes (Addendum)
PROGRESS NOTE    KESHONA KARTES  VPX:[id number] DOB: [birth year] DOA: 12/17/2017 PCP: Darreld Mclean, MD   Brief Narrative:   72 year old female with history of diabetes mellitus type 2, hyperlipidemia, chronic renal failure, diastolic CHF, recent Roux-en-Y bypass and ventral hernia repair came to the hospital with complains of shortness of breath.  She was found to have at least submassive PE bilaterally with right heart strain and elevated troponin.  Critical care team was consulted in the ER who recommended medical management.  Patient was admitted to the hospital on heparin drip.  Assessment & Plan:   Principal Problem:   Pulmonary embolism (HCC) Active Problems:   Diabetes ([place])   Leukocytosis   Hyponatremia   ARF (acute renal failure) (HCC)  Bilateral submassive pulmonary embolism with cor pulmonale -Could be a result of recent surgery versus her sedentary lifestyle with frequent long drives of about 3-4 hours daily - Troponins are slightly elevated, CTA of the chest showed bilateral pulmonary embolism with right heart strain -Continue heparin drip at this time, echocardiogram and lower extremity Dopplers ordered -Supplemental oxygen as needed, currently patient is on Ventimask.  She can use nonrebreather or CPAP if necessary. -Extensively discussed with her the difference between Coumadin versus DOAC.  Patient is leaning towards DOAC.  Will advise case manager to provide patient with cost at the time of discharge  Recent ventral hernia repair -Dressing in place, will consult wound care team to assist with the dressing.  Can consult general surgery if necessary  Acute kidney injury -Baseline creatinine 0.7, today is 1.41 -Continue gentle hydration.  Provide supportive care, avoid nephrotoxic drugs  Diabetes mellitus type 2 -Continue Accu-Chek and sliding scale.  Hold metformin  Hypothyroidism -Continue thyroid Armour  Hyperlipidemia -Continue pravastatin  Patient   is at high risk of decompensation.  Therefore will monitor closely in the stepdown unit.  DVT prophylaxis: Currently on heparin drip Code Status: Full code Family Communication: At bedside Disposition Plan: To be determined  Consultants:   Critical care curbside in the ER  Procedures:   None  Antimicrobials:   None   Subjective: Patient states her shortness of breath has significantly improved along with her chest pain.  She denies any other complaints besides still exertional shortness of breath when moving around too much. She tells me that she normally lives a sedentary lifestyle and drives about 2-4 hours daily work-related.  She recently also had ventral hernia repair about a week ago and her symptoms started after going home over the last weekend.  Denies any personal or family history of blood clots.  Denies any personal history of malignancy.  Extensively answered her question regarding different anticoagulation agents including Coumadin versus Pradaxa, Eliquis and Xarelto.  Objective: Vitals:   12/18/17 0900 12/18/17 1039 12/18/17 1239 12/18/17 1300  BP: 122/82 (!) 124/51  117/82  Pulse: 79 80  80  Resp: (!) 22 (!) 21  20  Temp:   (!) 97.4 F (36.3 C)   TempSrc:   Oral   SpO2: 99% 100%  98%  Weight:      Height:        Intake/Output Summary (Last 24 hours) at 12/18/2017 1335 Last data filed at 12/17/2017 1715 Gross per 24 hour  Intake 500 ml  Output -  Net 500 ml   Filed Weights   12/17/17 1552  Weight: 120.7 kg (266 lb)    Examination:  General exam: Appears calm and comfortable, Ventimask in place.  Saturating 98%  Respiratory system: Slight diffuse diminished breath sounds Cardiovascular system: S1 & S2 heard, RRR. No JVD, murmurs, rubs, gallops or clicks. No pedal edema. Gastrointestinal system: Abdomen is nondistended, soft and nontender. No organomegaly or masses felt. Normal bowel sounds heard. Central nervous system: Alert and oriented. No focal  neurological deficits. Extremities: Symmetric 5 x 5 power. Skin: No rashes, lesions or ulcers Psychiatry: Judgement and insight appear normal. Mood & affect appropriate.     Data Reviewed:   CBC: Recent Labs  Lab 12/12/17 1352 12/14/17 0450 12/17/17 1602 12/18/17 0130 12/18/17 0324  WBC 10.6* 16.2* 22.3* 17.7* 17.4*  HGB 12.2 10.8* 12.5 11.2* 11.1*  HCT 38.8 34.2* 38.3 34.8* 34.5*  MCV 84.2 83.6 81.7 81.1 81.2  PLT 564* 476* 351 329 786   Basic Metabolic Panel: Recent Labs  Lab 12/12/17 1352 12/14/17 0450 12/17/17 1602 12/18/17 0324  NA 138 135 133* 134*  K 4.7 4.4 4.3 4.0  CL 102 100* 97* 100*  CO2 31 26 20* 21*  GLUCOSE 96 107* 156* 165*  BUN 10 12 29* 34*  CREATININE 0.80 0.72 1.49* 1.41*  CALCIUM 9.2 8.7* 8.6* 8.3*   GFR: Estimated Creatinine Clearance: 48.2 mL/min (A) (by C-G formula based on SCr of 1.41 mg/dL (H)). Liver Function Tests: Recent Labs  Lab 12/18/17 0324  AST 84*  ALT 66*  ALKPHOS 107  BILITOT 0.9  PROT 6.7  ALBUMIN 3.0*   No results for input(s): LIPASE, AMYLASE in the last 168 hours. No results for input(s): AMMONIA in the last 168 hours. Coagulation Profile: No results for input(s): INR, PROTIME in the last 168 hours. Cardiac Enzymes: Recent Labs  Lab 12/17/17 2102 12/18/17 0324 12/18/17 1109  TROPONINI 0.35* 0.37* 0.21*   BNP (last 3 results) No results for input(s): PROBNP in the last 8760 hours. HbA1C: Recent Labs    12/17/17 2123  HGBA1C 6.1*   CBG: Recent Labs  Lab 12/14/17 0719 12/14/17 1145 12/17/17 2207 12/18/17 0808 12/18/17 1239  GLUCAP 101* 127* 163* 153* 142*   Lipid Profile: No results for input(s): CHOL, HDL, LDLCALC, TRIG, CHOLHDL, LDLDIRECT in the last 72 hours. Thyroid Function Tests: No results for input(s): TSH, T4TOTAL, FREET4, T3FREE, THYROIDAB in the last 72 hours. Anemia Panel: No results for input(s): VITAMINB12, FOLATE, FERRITIN, TIBC, IRON, RETICCTPCT in the last 72 hours. Sepsis  Labs: No results for input(s): PROCALCITON, LATICACIDVEN in the last 168 hours.  Recent Results (from the past 240 hour(s))  MRSA PCR Screening     Status: None   Collection Time: 12/18/17 10:58 AM  Result Value Ref Range Status   MRSA by PCR NEGATIVE NEGATIVE Final    Comment:        The GeneXpert MRSA Assay (FDA approved for NASAL specimens only), is one component of a comprehensive MRSA colonization surveillance program. It is not intended to diagnose MRSA infection nor to guide or monitor treatment for MRSA infections.          Radiology Studies: Ct Angio Chest Pe W And/or Wo Contrast  Result Date: 12/17/2017 CLINICAL DATA:  Recent hernia repair. Last night pt become weak and short of breath. Shortness of breath has worsened today. ^90mL ISOVUE-370 IOPAMIDOL (ISOVUE-370) INJECTION 76% EXAM: CT ANGIOGRAPHY CHEST WITH CONTRAST TECHNIQUE: Multidetector CT imaging of the chest was performed using the standard protocol during bolus administration of intravenous contrast. Multiplanar CT image reconstructions and MIPs were obtained to evaluate the vascular anatomy. CONTRAST:  52mL ISOVUE-370 IOPAMIDOL (ISOVUE-370) INJECTION 76% COMPARISON:  Chest  CT 12/17/2017 FINDINGS: Cardiovascular: There is a filling defect within proximal RIGHT upper lobe pulmonary artery which is occlusive (image 50, series 5). Filling defect within the RIGHT middle lobe pulmonary artery which is proximal. Segmental filling defects within the lower lobe pulmonary arteries on the RIGHT. Filling defects within the segmental pulmonary arteries in the LEFT lower lobe which are occlusive. Lingular filling defect distally. Probable upper lobe pulmonary filling defects. Findings consistent with bilateral multiple pulmonary emboli. The RIGHT ventricular to LEFT ventricular ratio is equal to 1.47 Mediastinum/Nodes: No axillary supraclavicular adenopathy. No mediastinal hilar adenopathy. No pericardial effusion. Lungs/Pleura: No  pulmonary infarction identified. Mild atelectasis the RIGHT lung base. Upper Abdomen: Limited view of the liver, kidneys, pancreas are unremarkable. Normal adrenal glands. Musculoskeletal: No aggressive osseous lesion. Review of the MIP images confirms the above findings. IMPRESSION: 1. Bilateral occlusive pulmonary emboli involving the upper and lower lobes. Emboli involve the proximal segmental pulmonary arteries. 2. Positive for acute PE with CT evidence of right heart strain (RV/LV Ratio = 1.47) consistent with at least submassive (intermediate risk) PE. The presence of right heart strain has been associated with an increased risk of morbidity and mortality. Please activate Code PE by paging [phone]. Critical Value/emergent results were called by telephone at the time of interpretation on 12/17/2017 at 6:21 pm to Dr. Nat Christen , who verbally acknowledged these results. Electronically Signed   By: Suzy Bouchard M.D.   On: 12/17/2017 18:22   Dg Chest Portable 1 View  Result Date: 12/17/2017 CLINICAL DATA:  Chest pain and shortness of breath EXAM: PORTABLE CHEST 1 VIEW COMPARISON:  May 10, 2016 FINDINGS: Cardiomegaly. The hila and mediastinum are unremarkable given the low volume portable technique. No pulmonary nodules or masses. No focal infiltrates. No overt edema. Continued elevation of the right hemidiaphragm. IMPRESSION: No active disease. Electronically Signed   By: Dorise Bullion III M.D   On: 12/17/2017 16:31        Scheduled Meds: . insulin aspart  0-5 Units Subcutaneous QHS  . insulin aspart  0-9 Units Subcutaneous TID WC  . mouth rinse  15 mL Mouth Rinse BID  . pravastatin  20 mg Oral q1800  . thyroid  60 mg Oral QAC breakfast   Continuous Infusions: . heparin 1,400 Units/hr (12/18/17 1057)     LOS: 1 day    Time spent: 35 mins    Cejay Cambre Arsenio Loader, MD [facility] Pager [phone]   If 7PM-7AM, please contact night-coverage [email or web address] Password  [email or web address] 12/18/2017, 1:35 PM

## 2017-12-19 ENCOUNTER — Inpatient Hospital Stay (HOSPITAL_COMMUNITY): Payer: Medicare HMO

## 2017-12-19 ENCOUNTER — Encounter (HOSPITAL_COMMUNITY): Payer: Medicare HMO

## 2017-12-19 DIAGNOSIS — I2699 Other pulmonary embolism without acute cor pulmonale: Secondary | ICD-10-CM

## 2017-12-19 DIAGNOSIS — E118 Type 2 diabetes mellitus with unspecified complications: Secondary | ICD-10-CM

## 2017-12-19 DIAGNOSIS — N179 Acute kidney failure, unspecified: Secondary | ICD-10-CM

## 2017-12-19 DIAGNOSIS — E871 Hypo-osmolality and hyponatremia: Secondary | ICD-10-CM

## 2017-12-19 DIAGNOSIS — I361 Nonrheumatic tricuspid (valve) insufficiency: Secondary | ICD-10-CM

## 2017-12-19 DIAGNOSIS — D72829 Elevated white blood cell count, unspecified: Secondary | ICD-10-CM

## 2017-12-19 LAB — GLUCOSE, CAPILLARY
GLUCOSE-CAPILLARY: 137 mg/dL — AB (ref 65–99)
GLUCOSE-CAPILLARY: 119 mg/dL — AB (ref 65–99)
Glucose-Capillary: 119 mg/dL — ABNORMAL HIGH (ref 65–99)
Glucose-Capillary: 113 mg/dL — ABNORMAL HIGH (ref 65–99)

## 2017-12-19 LAB — CBC
HCT: 34.4 % — ABNORMAL LOW (ref 36.0–46.0)
HEMOGLOBIN: 10.8 g/dL — AB (ref 12.0–15.0)
MCH: 26.2 pg (ref 26.0–34.0)
MCHC: 31.4 g/dL (ref 30.0–36.0)
MCV: 83.5 fL (ref 78.0–100.0)
Platelets: 342 10*3/uL (ref 150–400)
RBC: 4.12 MIL/uL (ref 3.87–5.11)
RDW: 14.7 % (ref 11.5–15.5)
WBC: 18 10*3/uL — ABNORMAL HIGH (ref 4.0–10.5)

## 2017-12-19 LAB — ECHOCARDIOGRAM COMPLETE
HEIGHTINCHES: 64 in
Weight: 4525.6 oz

## 2017-12-19 LAB — HEPARIN LEVEL (UNFRACTIONATED): Heparin Unfractionated: 0.37 IU/mL (ref 0.30–0.70)

## 2017-12-19 NOTE — Consult Note (Signed)
[facility] Nurse wound consult note Reason for Consult: Consult requested for abd wound.  Pt had hernia surgery on 1/15 with Dr Excell Seltzer, according to the EMR. Wound type: Full thickness post-op wound with staples intact and well-approximated; no edema, erythremia, fluctuance, or drainage. Dressing procedure/placement/frequency: Dry gauze over staples to protect from further injury.  Pt should follow-up with the surgical team to remove staples after 10 days. Please re-consult if further assistance is needed.  Thank-you,  Julien Girt MSN, [place], [place], [place], [place]

## 2017-12-19 NOTE — Progress Notes (Signed)
  Echocardiogram 2D Echocardiogram has been performed.  Jennette Dubin 12/19/2017, 11:25 AM

## 2017-12-19 NOTE — Progress Notes (Signed)
[facility] for heparin Indication: pulmonary embolus  Allergies  Allergen Reactions  . Nsaids Swelling and Other (See Comments)    Severe swelling--water retention (including water on brain)  . Tol[place]in Swelling and Other (See Comments)    Severe swelling-water retention (including water on brain)     Patient Measurements: Height: 5\' 4"  (162.6 cm) Weight: 282 lb 13.6 oz (128.3 kg) IBW/kg (Calculated) : 54.7 Heparin Dosing Weight: 84kg  Vital Signs: Temp: 97.8 F (36.6 C) (01/21 0811) Temp Source: Oral (01/21 0811) BP: 99/66 (01/21 1000) Pulse Rate: 77 (01/21 1000)  Labs: Recent Labs    12/17/17 1602 12/17/17 2102  12/18/17 0130 12/18/17 0324 12/18/17 1109 12/18/17 1832 12/19/17 0221  HGB 12.5  --   --  11.2* 11.1*  --   --  10.8*  HCT 38.3  --   --  34.8* 34.5*  --   --  34.4*  PLT 351  --   --  329 338  --   --  342  HEPARINUNFRC  --   --    < > 0.49  --  0.20* 0.24* 0.37  CREATININE 1.49*  --   --   --  1.41*  --   --   --   TROPONINI  --  0.35*  --   --  0.37* 0.21*  --   --    < > = values in this interval not displayed.    Estimated Creatinine Clearance: 50 mL/min (A) (by C-G formula based on SCr of 1.41 mg/dL (H)).  Assessment: 50 YOF with recent R hernia repair who presents with dyspnea for 24h- CTA positive for PE. Currently on IV heparin at 1550 units/hr. Hep lvl now therapeutic  Goal of Therapy:  Heparin level 0.3-0.7 units/ml Monitor platelets by anticoagulation protocol: Yes   Plan:  Continue hep [id number] units/hr  Monitor daily HL, CBC and s/s of bleeding   Levester Fresh, PharmD, BCPS, BCCCP Clinical Pharmacist Clinical phone for 12/19/2017 from 7a-3:30p: [phone] If after 3:30p, please call main pharmacy at: [phone] 12/19/2017 10:11 AM

## 2017-12-19 NOTE — Progress Notes (Signed)
PROGRESS NOTE    Kathleen Miller  ION:[id number] DOB: Dec 15, 1945 DOA: 12/17/2017 PCP: Darreld Mclean, MD   Brief Narrative:  72 year old female with history of diabetes mellitus type 2, hyperlipidemia, chronic renal failure, diastolic CHF, recent Roux-en-Y bypass and ventral hernia repair came to the hospital with complains of shortness of breath.  She was found to have at least submassive PE bilaterally with right heart strain and elevated troponin.  Critical care team was consulted in the ER who recommended medical management.  Patient was admitted to the hospital on heparin drip.  Assessment & Plan:   Principal Problem:   Pulmonary embolism (HCC) Active Problems:   Diabetes ([place])   Leukocytosis   Hyponatremia   ARF (acute renal failure) (HCC)  Bilateral submassive pulmonary embolism with cor pulmonale: Could be a result of recent surgery versus her sedentary lifestyle with frequent long drives of about 3-4 hours daily. Normal estimated pulmonary artery pressure on echo.  - Continue IV heparin, plan to switch to eliquis/xarelto 1/22 once pt has time to research these medications further.  - Wean ventimask to nasal cannula as able.  - Consult care management to confirm co-pays for eliquis vs. xarelto.   Type II NSTEMI: Troponin elevation improved, mild due to right heart strain.  - No interventions planned.   Recent ventral hernia repair: No complications.  - WOC consulted, stated no further interventions necessary.   Acute kidney injury: Baseline creatinine 0.7, up to 1.49 on admission.  - Monitor BMP in AM. Would still qualify for DOAC.  - Avoid nephrotoxic drugs  T2DM - SSI - Hold metformin  Hypothyroidism - Continue thyroid Armour  Hyperlipidemia - Ordered formulary statin  DVT prophylaxis: IV heparin Code Status: Full code Family Communication: None at bedside this AM Disposition Plan: Continue SDU while still on high oxygen requirement. She remains at very  high risk for decompensation. Hopeful to transfer to floor 1/22.    Consultants:   Critical care curbside in the ER  Procedures:   None  Antimicrobials:   None  Subjective: Still with severe dyspnea with any exertion. Has not attempted to get OOB and this is flaring her chronic back pain. Has anxiety about prolonged hospitalization, living alone unsure of how she will care for herself, though has enlisted brother to help for a week.    Objective: Vitals:   12/19/17 1000 12/19/17 1100 12/19/17 1135 12/19/17 1200  BP: 99/66 115/77  107/68  Pulse: 77 72  79  Resp: 19 19  20   Temp:   98.1 F (36.7 C)   TempSrc:   Oral   SpO2: 100% 98%  98%  Weight:      Height:        Intake/Output Summary (Last 24 hours) at 12/19/2017 1301 Last data filed at 12/19/2017 0900 Gross per 24 hour  Intake 1833.46 ml  Output 500 ml  Net 1333.46 ml   Filed Weights   12/17/17 1552 12/19/17 0500  Weight: 120.7 kg (266 lb) 128.3 kg (282 lb 13.6 oz)   Gen: Nontoxic-appearing 72 y.o.female in NAD HEENT: PERRL, MMM, posterior oropharynx clear, Edentulous Pulm: Non-labored, but tachypneic with any exertion. Diminished globally with crackles at bilateral bases that clear with several deep inspirations. No wheezes  CV: Regular rate, no murmur; distal pulses intact/symmetric; Trace LE edema GI: + BS; soft, non-tender, non-distended, no HSM, no hernia Skin: No rashes, wounds, ulcers MSK: Normal muscle bulk. No digital clubbing/cyanosis Neuro: CN II-XII without deficits, sensation intact to light  touch Psych: A&Ox3, mood anxious with congruent affect, speech clear and coherent  Data Reviewed:   CBC: Recent Labs  Lab 12/14/17 0450 12/17/17 1602 12/18/17 0130 12/18/17 0324 12/19/17 0221  WBC 16.2* 22.3* 17.7* 17.4* 18.0*  HGB 10.8* 12.5 11.2* 11.1* 10.8*  HCT 34.2* 38.3 34.8* 34.5* 34.4*  MCV 83.6 81.7 81.1 81.2 83.5  PLT 476* 351 329 338 323   Basic Metabolic Panel: Recent Labs  Lab  12/12/17 1352 12/14/17 0450 12/17/17 1602 12/18/17 0324  NA 138 135 133* 134*  K 4.7 4.4 4.3 4.0  CL 102 100* 97* 100*  CO2 31 26 20* 21*  GLUCOSE 96 107* 156* 165*  BUN 10 12 29* 34*  CREATININE 0.80 0.72 1.49* 1.41*  CALCIUM 9.2 8.7* 8.6* 8.3*   GFR: Estimated Creatinine Clearance: 50 mL/min (A) (by C-G formula based on SCr of 1.41 mg/dL (H)). Liver Function Tests: Recent Labs  Lab 12/18/17 0324  AST 84*  ALT 66*  ALKPHOS 107  BILITOT 0.9  PROT 6.7  ALBUMIN 3.0*   Cardiac Enzymes: Recent Labs  Lab 12/17/17 2102 12/18/17 0324 12/18/17 1109  TROPONINI 0.35* 0.37* 0.21*   BNP (last 3 results) No results for input(s): PROBNP in the last 8760 hours. HbA1C: Recent Labs    12/17/17 2123  HGBA1C 6.1*   CBG: Recent Labs  Lab 12/18/17 1239 12/18/17 1619 12/18/17 2213 12/19/17 0810 12/19/17 1132  GLUCAP 142* 138* 166* 137* 119*    Recent Results (from the past 240 hour(s))  MRSA PCR Screening     Status: None   Collection Time: 12/18/17 10:58 AM  Result Value Ref Range Status   MRSA by PCR NEGATIVE NEGATIVE Final    Comment:        The GeneXpert MRSA Assay (FDA approved for NASAL specimens only), is one component of a comprehensive MRSA colonization surveillance program. It is not intended to diagnose MRSA infection nor to guide or monitor treatment for MRSA infections.      Radiology Studies: Ct Angio Chest Pe W And/or Wo Contrast  Result Date: 12/17/2017 CLINICAL DATA:  Recent hernia repair. Last night pt become weak and short of breath. Shortness of breath has worsened today. ^106mL ISOVUE-370 IOPAMIDOL (ISOVUE-370) INJECTION 76% EXAM: CT ANGIOGRAPHY CHEST WITH CONTRAST TECHNIQUE: Multidetector CT imaging of the chest was performed using the standard protocol during bolus administration of intravenous contrast. Multiplanar CT image reconstructions and MIPs were obtained to evaluate the vascular anatomy. CONTRAST:  74mL ISOVUE-370 IOPAMIDOL  (ISOVUE-370) INJECTION 76% COMPARISON:  Chest CT 12/17/2017 FINDINGS: Cardiovascular: There is a filling defect within proximal RIGHT upper lobe pulmonary artery which is occlusive (image 50, series 5). Filling defect within the RIGHT middle lobe pulmonary artery which is proximal. Segmental filling defects within the lower lobe pulmonary arteries on the RIGHT. Filling defects within the segmental pulmonary arteries in the LEFT lower lobe which are occlusive. Lingular filling defect distally. Probable upper lobe pulmonary filling defects. Findings consistent with bilateral multiple pulmonary emboli. The RIGHT ventricular to LEFT ventricular ratio is equal to 1.47 Mediastinum/Nodes: No axillary supraclavicular adenopathy. No mediastinal hilar adenopathy. No pericardial effusion. Lungs/Pleura: No pulmonary infarction identified. Mild atelectasis the RIGHT lung base. Upper Abdomen: Limited view of the liver, kidneys, pancreas are unremarkable. Normal adrenal glands. Musculoskeletal: No aggressive osseous lesion. Review of the MIP images confirms the above findings. IMPRESSION: 1. Bilateral occlusive pulmonary emboli involving the upper and lower lobes. Emboli involve the proximal segmental pulmonary arteries. 2. Positive for acute PE with  CT evidence of right heart strain (RV/LV Ratio = 1.47) consistent with at least submassive (intermediate risk) PE. The presence of right heart strain has been associated with an increased risk of morbidity and mortality. Please activate Code PE by paging [phone]. Critical Value/emergent results were called by telephone at the time of interpretation on 12/17/2017 at 6:21 pm to Dr. Nat Christen , who verbally acknowledged these results. Electronically Signed   By: Suzy Bouchard M.D.   On: 12/17/2017 18:22   Dg Chest Portable 1 View  Result Date: 12/17/2017 CLINICAL DATA:  Chest pain and shortness of breath EXAM: PORTABLE CHEST 1 VIEW COMPARISON:  May 10, 2016 FINDINGS:  Cardiomegaly. The hila and mediastinum are unremarkable given the low volume portable technique. No pulmonary nodules or masses. No focal infiltrates. No overt edema. Continued elevation of the right hemidiaphragm. IMPRESSION: No active disease. Electronically Signed   By: Dorise Bullion III M.D   On: 12/17/2017 16:31   Scheduled Meds: . insulin aspart  0-5 Units Subcutaneous QHS  . insulin aspart  0-9 Units Subcutaneous TID WC  . mouth rinse  15 mL Mouth Rinse BID  . pravastatin  20 mg Oral q1800  . thyroid  60 mg Oral QAC breakfast   Continuous Infusions: . sodium chloride 75 mL/hr at 12/19/17 0748  . heparin 1,550 Units/hr (12/19/17 0800)     LOS: 2 days   Time spent: 35 mins  Vance Gather, MD [facility] Pager [phone]   If 7PM-7AM, please contact night-coverage [email or web address] Password [email or web address] 12/19/2017, 1:01 PM

## 2017-12-19 NOTE — Progress Notes (Signed)
*  Preliminary Results* Bilateral lower extremity venous duplex completed. The right lower extremity is positive for acute deep vein thrombosis involving the right posterior tibial and peroneal veins. There is no obvious evidence of deep vein thrombosis involving the left lower extremity. There is no evidence of Baker's cyst bilaterally.  Preliminary results discussed with Kieth Brightly, RN.  12/19/2017 11:05 AM Maudry Mayhew, BS, RVT, RDCS, RDMS

## 2017-12-20 ENCOUNTER — Other Ambulatory Visit: Payer: Self-pay

## 2017-12-20 ENCOUNTER — Telehealth: Payer: Self-pay

## 2017-12-20 DIAGNOSIS — E871 Hypo-osmolality and hyponatremia: Secondary | ICD-10-CM | POA: Diagnosis not present

## 2017-12-20 DIAGNOSIS — E118 Type 2 diabetes mellitus with unspecified complications: Secondary | ICD-10-CM | POA: Diagnosis not present

## 2017-12-20 DIAGNOSIS — I2699 Other pulmonary embolism without acute cor pulmonale: Secondary | ICD-10-CM | POA: Diagnosis not present

## 2017-12-20 DIAGNOSIS — L8990 Pressure ulcer of unspecified site, unspecified stage: Secondary | ICD-10-CM

## 2017-12-20 DIAGNOSIS — D72829 Elevated white blood cell count, unspecified: Secondary | ICD-10-CM | POA: Diagnosis not present

## 2017-12-20 DIAGNOSIS — N179 Acute kidney failure, unspecified: Secondary | ICD-10-CM | POA: Diagnosis not present

## 2017-12-20 LAB — CBC
HEMATOCRIT: 34.1 % — AB (ref 36.0–46.0)
HEMOGLOBIN: 10.6 g/dL — AB (ref 12.0–15.0)
MCH: 26.1 pg (ref 26.0–34.0)
MCHC: 31.1 g/dL (ref 30.0–36.0)
MCV: 84 fL (ref 78.0–100.0)
Platelets: 343 10*3/uL (ref 150–400)
RBC: 4.06 MIL/uL (ref 3.87–5.11)
RDW: 15.2 % (ref 11.5–15.5)
WBC: 14.4 10*3/uL — ABNORMAL HIGH (ref 4.0–10.5)

## 2017-12-20 LAB — GLUCOSE, CAPILLARY
GLUCOSE-CAPILLARY: 139 mg/dL — AB (ref 65–99)
Glucose-Capillary: 108 mg/dL — ABNORMAL HIGH (ref 65–99)
Glucose-Capillary: 150 mg/dL — ABNORMAL HIGH (ref 65–99)

## 2017-12-20 LAB — BASIC METABOLIC PANEL
ANION GAP: 11 (ref 5–15)
BUN: 23 mg/dL — ABNORMAL HIGH (ref 6–20)
CHLORIDE: 101 mmol/L (ref 101–111)
CO2: 22 mmol/L (ref 22–32)
Calcium: 8.4 mg/dL — ABNORMAL LOW (ref 8.9–10.3)
Creatinine, Ser: 0.75 mg/dL (ref 0.44–1.00)
GFR calc Af Amer: >60 mL/min (ref >60)
GFR calc non Af Amer: >60 mL/min (ref >60)
GLUCOSE: 110 mg/dL — AB (ref 65–99)
POTASSIUM: 3.8 mmol/L (ref 3.5–5.1)
Sodium: 134 mmol/L — ABNORMAL LOW (ref 135–145)

## 2017-12-20 LAB — HEPARIN LEVEL (UNFRACTIONATED): Heparin Unfractionated: 0.25 IU/mL — ABNORMAL LOW (ref 0.30–0.70)

## 2017-12-20 MED ORDER — APIXABAN 5 MG PO TABS: 5.0000 mg | ORAL_TABLET | Freq: Two times a day (BID) | ORAL | Status: DC

## 2017-12-20 MED ORDER — APIXABAN 5 MG PO TABS
10.0000 mg | ORAL_TABLET | Freq: Two times a day (BID) | ORAL | Status: AC
  Administered 2017-12-20 (×2): 10 mg via ORAL
  Filled 2017-12-20 (×2): qty 2

## 2017-12-20 MED ORDER — APIXABAN 5 MG PO TABS
10.0000 mg | ORAL_TABLET | Freq: Two times a day (BID) | ORAL | Status: DC
  Administered 2017-12-21: 10 mg via ORAL
  Filled 2017-12-20: qty 2

## 2017-12-20 MED ORDER — SALINE SPRAY 0.65 % NA SOLN
1.0000 | NASAL | Status: DC | PRN
  Administered 2017-12-21: 1 via NASAL
  Filled 2017-12-20: qty 44

## 2017-12-20 NOTE — Progress Notes (Signed)
PROGRESS NOTE    Kathleen Miller  QHU:[id number] DOB: [birth year] DOA: 12/17/2017 PCP: Darreld Mclean, MD   Brief Narrative:  72 year old female with history of diabetes mellitus type 2, hyperlipidemia, chronic renal failure, diastolic CHF, recent Roux-en-Y bypass and ventral hernia repair came to the hospital with complains of shortness of breath.  She was found to have at least submassive PE bilaterally with right heart strain and elevated troponin.  Critical care team was consulted in the ER who recommended medical management.  Patient was admitted to the hospital on heparin drip. Ventimask has been transitioned to supplemental oxygen by nasal cannula. Eliquis was started and the patient ransferred to the floor 1/22.   Assessment & Plan:   Principal Problem:   Pulmonary embolism (HCC) Active Problems:   Diabetes ([place])   Leukocytosis   Hyponatremia   ARF (acute renal failure) (HCC)   Pressure injury of skin  Bilateral submassive pulmonary embolism with cor pulmonale: Could be a result of recent surgery versus her sedentary lifestyle with frequent long drives of about 3-4 hours daily. Normal estimated pulmonary artery pressure on echo.  - Switch to eliquis per discussion with patient. CM states co-pay is $47/month without need for prior authorization. Pt declines coumadin, though states this will present financial struggles. - Wean oxygen as able.   Type II NSTEMI: Troponin elevation improved, mild due to right heart strain.  - No interventions planned.   Recent ventral hernia repair: No complications.  - WOC consulted, stated no further interventions necessary.  - Follow up with general surgery as previously scheduled.  Acute kidney injury: Baseline creatinine 0.7, up to 1.49 on admission, now resolved. - Avoid nephrotoxic drugs  T2DM: HbA1c 6.1% - Will stop SSI given tight control at home and thus far.  - Hold metformin  Hypothyroidism - Continue thyroid  [place]  Hyperlipidemia - Ordered formulary statin  DVT prophylaxis: IV heparin > Eliquis Code Status: Full code Family Communication: Brother at bedside this AM Disposition Plan: Stable for transfer to med-surg. Therapy evaluations ordered.    Consultants:   Critical care curbside in the ER  Procedures:   None  Antimicrobials:   None  Subjective: Having dyspnea with getting up to bedside chair (2 steps with assistance). Back pain improved. Currently refusing SNF/CIR despite functional limitations.   Objective: Vitals:   12/20/17 0316 12/20/17 0525 12/20/17 0726 12/20/17 1252  BP: (!) 95/55   109/67  Pulse: 75   85  Resp: 20   (!) 25  Temp: 97.8 F (36.6 C)  98.2 F (36.8 C) 98.1 F (36.7 C)  TempSrc: Oral  Oral Oral  SpO2: 94%   91%  Weight:  130 kg (286 lb 9.6 oz)    Height:        Intake/Output Summary (Last 24 hours) at 12/20/2017 1354 Last data filed at 12/20/2017 0600 Gross per 24 hour  Intake 995.5 ml  Output 750 ml  Net 245.5 ml   Filed Weights   12/17/17 1552 12/19/17 0500 12/20/17 0525  Weight: 120.7 kg (266 lb) 128.3 kg (282 lb 13.6 oz) 130 kg (286 lb 9.6 oz)   Gen: Nontoxic-appearing 72 y.o.female in NAD sitting in bedside chair HEENT: PERRL, MMM, posterior oropharynx clear, Edentulous Pulm: Non-labored tachypnea at rest. Diminished globally. No crackles or wheezes  CV: Regular rate, no murmur; distal pulses intact/symmetric; Trace LE edema GI: + BS; soft, non-tender, non-distended, no HSM, no hernia Skin: No rashes, wounds, ulcers MSK: Normal muscle bulk. No digital  clubbing/cyanosis Neuro: CN II-XII without deficits, sensation intact to light touch Psych: A&Ox3, mood anxious with congruent affect, speech clear and coherent  Data Reviewed:   CBC: Recent Labs  Lab 12/17/17 1602 12/18/17 0130 12/18/17 0324 12/19/17 0221 12/20/17 0318  WBC 22.3* 17.7* 17.4* 18.0* 14.4*  HGB 12.5 11.2* 11.1* 10.8* 10.6*  HCT 38.3 34.8* 34.5* 34.4* 34.1*   MCV 81.7 81.1 81.2 83.5 84.0  PLT 351 329 338 342 740   Basic Metabolic Panel: Recent Labs  Lab 12/14/17 0450 12/17/17 1602 12/18/17 0324 12/20/17 0318  NA 135 133* 134* 134*  K 4.4 4.3 4.0 3.8  CL 100* 97* 100* 101  CO2 26 20* 21* 22  GLUCOSE 107* 156* 165* 110*  BUN 12 29* 34* 23*  CREATININE 0.72 1.49* 1.41* 0.75  CALCIUM 8.7* 8.6* 8.3* 8.4*   GFR: Estimated Creatinine Clearance: 88.8 mL/min (by C-G formula based on SCr of 0.75 mg/dL). Liver Function Tests: Recent Labs  Lab 12/18/17 0324  AST 84*  ALT 66*  ALKPHOS 107  BILITOT 0.9  PROT 6.7  ALBUMIN 3.0*   Cardiac Enzymes: Recent Labs  Lab 12/17/17 2102 12/18/17 0324 12/18/17 1109  TROPONINI 0.35* 0.37* 0.21*   BNP (last 3 results) No results for input(s): PROBNP in the last 8760 hours. HbA1C: Recent Labs    12/17/17 2123  HGBA1C 6.1*   CBG: Recent Labs  Lab 12/19/17 1132 12/19/17 1636 12/19/17 2249 12/20/17 0804 12/20/17 1249  GLUCAP 119* 113* 119* 108* 139*    Recent Results (from the past 240 hour(s))  MRSA PCR Screening     Status: None   Collection Time: 12/18/17 10:58 AM  Result Value Ref Range Status   MRSA by PCR NEGATIVE NEGATIVE Final    Comment:        The GeneXpert MRSA Assay (FDA approved for NASAL specimens only), is one component of a comprehensive MRSA colonization surveillance program. It is not intended to diagnose MRSA infection nor to guide or monitor treatment for MRSA infections.      Radiology Studies: No results found. Scheduled Meds: . insulin aspart  0-5 Units Subcutaneous QHS  . insulin aspart  0-9 Units Subcutaneous TID WC  . mouth rinse  15 mL Mouth Rinse BID  . pravastatin  20 mg Oral q1800  . thyroid  60 mg Oral QAC breakfast   Continuous Infusions: . sodium chloride 75 mL/hr at 12/19/17 2135  . heparin 1,550 Units/hr (12/20/17 1354)     LOS: 3 days   Time spent: 35 mins  Vance Gather, MD [facility] Pager [phone]   If  7PM-7AM, please contact night-coverage [email or web address] Password [facility] 12/20/2017, 1:54 PM

## 2017-12-20 NOTE — Progress Notes (Addendum)
[place] for heparin Indication: pulmonary embolus  Allergies  Allergen Reactions  . Nsaids Swelling and Other (See Comments)    Severe swelling--water retention (including water on brain)  . Tol[place]in Swelling and Other (See Comments)    Severe swelling-water retention (including water on brain)     Patient Measurements: Height: 5\' 4"  (162.6 cm) Weight: 286 lb 9.6 oz (130 kg) IBW/kg (Calculated) : 54.7 Heparin Dosing Weight: 84kg  Vital Signs: Temp: 98.2 F (36.8 C) (01/22 0726) Temp Source: Oral (01/22 0726) BP: 95/55 (01/22 0316) Pulse Rate: 75 (01/22 0316)  Labs: Recent Labs    12/17/17 1602 12/17/17 2102  12/18/17 0324 12/18/17 1109 12/18/17 1832 12/19/17 0221 12/20/17 0318  HGB 12.5  --    < > 11.1*  --   --  10.8* 10.6*  HCT 38.3  --    < > 34.5*  --   --  34.4* 34.1*  PLT 351  --    < > 338  --   --  342 343  HEPARINUNFRC  --   --    < >  --  0.20* 0.24* 0.37 0.25*  CREATININE 1.49*  --   --  1.41*  --   --   --  0.75  TROPONINI  --  0.35*  --  0.37* 0.21*  --   --   --    < > = values in this interval not displayed.    Estimated Creatinine Clearance: 88.8 mL/min (by C-G formula based on SCr of 0.75 mg/dL).  Assessment: 32 YOF with recent R hernia repair who presents with dyspnea for 24h- CTA positive for PE.  Currently on IV heparin at 1550 units/hr, morning heparin level dropped below goal at 0.25. CBC stable overnight. No bleeding issues noted.   Goal of Therapy:  Heparin level 0.3-0.7 units/ml Monitor platelets by anticoagulation protocol: Yes   Plan:  Will increase heparin infusion to 1750 units/hr  Monitor daily HL, CBC and s/s of bleeding   Erin Hearing PharmD., BCPS Clinical Pharmacist 12/20/2017 9:03 AM  Addendum:   Patient transitioned to apixaban this afternoon.   Plan Apixaban 10mg  bid x 7 days then drop down to 5mg  bid  12/20/2017 2:31 PM

## 2017-12-20 NOTE — Evaluation (Signed)
Occupational Therapy Evaluation Patient Details Name: Kathleen Miller MRN: [id number] DOB: [birth year] Today's Date: 12/20/2017    History of Present Illness Kathleen Miller  is a 72 y.o. female, LAPAROSCOPIC ASSTED [place] 12/13/17, pred-diabetes, hypothyroidism, Roux-n -y gastric bypass presents with c/o increase sob since Wednesday.  Pt has had sharp chest pain lasting for a few seconds sometimes with cough (dry) or with breathing.  Pt presented to ED due to dyspnea.  found to have Bilateral submassive pulmonary embolism with cor pulmonale and RLE DVT   Clinical Impression   This 72 yo female admitted and found to have above presents to acute OT with decreased activity tolerance, decreased balance (standing), increased pain and obesity all affecting her ability to care for herself independently/Mod I. She will benefit from acute OT with follow up [place] and 24/7 S/A to get back to PLOF.    Follow Up Recommendations  Home health OT;Supervision/Assistance - 24 hour;Other (comment)(feel she could benefit from CIR/SNF stay but pt wants home)    Equipment Recommendations  None recommended by OT       Precautions / Restrictions Precautions Precautions: Fall Precaution Comments: abdominal incisions Restrictions Weight Bearing Restrictions: No      Mobility Bed Mobility Overal bed mobility: Needs Assistance Bed Mobility: Sidelying to Sit   Sidelying to sit: Mod assist       General bed mobility comments: Pt was already on her left side when I entered  Transfers Overall transfer level: Needs assistance Equipment used: Rolling walker (2 wheeled) Transfers: Sit to/from [facility] Sit to Stand: Min assist;+2 physical assistance Stand pivot transfers: Min assist;+2 physical assistance       General transfer comment: cues for safe hand placement    Balance Overall balance assessment: Needs assistance Sitting-balance support: No upper extremity  supported;Feet supported Sitting balance-Leahy Scale: Fair     Standing balance support: Bilateral upper extremity supported;During functional activity Standing balance-Leahy Scale: Poor Standing balance comment: reliant on RW                           ADL either performed or assessed with clinical judgement   ADL Overall ADL's : Needs assistance/impaired Eating/Feeding: Independent;Sitting   Grooming: Set up;Sitting   Upper Body Bathing: Moderate assistance;Sitting   Lower Body Bathing: Total assistance Lower Body Bathing Details (indicate cue type and reason): Min A +2 sit<>stand Upper Body Dressing : Moderate assistance;Sitting   Lower Body Dressing: Total assistance Lower Body Dressing Details (indicate cue type and reason): min A +2 sit<>stand Toilet Transfer: Minimal assistance;+2 for physical assistance;Stand-pivot;RW Toilet Transfer Details (indicate cue type and reason): bed>recliner Toileting- Clothing Manipulation and Hygiene: Total assistance Toileting - Clothing Manipulation Details (indicate cue type and reason): min A +2 sit<>stand             Vision Patient Visual Report: No change from baseline              Pertinent Vitals/Pain Pain Assessment: 0-10 Pain Score: 5  Pain Location: incisonal site (abdomen) Pain Descriptors / Indicators: Sore Pain Intervention(s): Monitored during session;Repositioned(asked RN to get a "heart pillow " for her to hold on abodomen when she coughs--pt had one before I left her room)     Hand Dominance Right   Extremity/Trunk Assessment Upper Extremity Assessment Upper Extremity Assessment: Generalized weakness   Lower Extremity Assessment Lower Extremity Assessment: Defer to PT evaluation       Communication Communication Communication:  No difficulties   Cognition Arousal/Alertness: Awake/alert Behavior During Therapy: WFL for tasks assessed/performed Overall Cognitive Status: Within Functional  Limits for tasks assessed                                                Home Living Family/patient expects to be discharged to:: Private residence Living Arrangements: Alone Available Help at Discharge: Family;Friend(s)(close to 24/7) Type of Home: House Home Access: Stairs to enter [employer] of Steps: 1 Entrance Stairs-Rails: None Home Layout: One level     Bathroom Shower/Tub: Walk-in shower;Door   Bathroom Toilet: Handicapped height     Home Equipment: Tub bench;Cane - single point;Walker - 4 wheels;Walker - 2 wheels;Grab bars - toilet;Grab bars - tub/shower;Hand held shower head;Adaptive equipment Adaptive Equipment: Reacher        Prior Functioning/Environment Level of Independence: Independent with assistive device(s)        Comments: uses SPC, RW in house, rollator when out and about or longer distances        OT Problem List: Decreased strength;Impaired balance (sitting and/or standing);Decreased activity tolerance;Pain;Obesity      OT Treatment/Interventions: Self-care/ADL training;Balance training;Therapeutic activities;DME and/or AE instruction;Patient/family education    OT Goals(Current goals can be found in the care plan section) Acute Rehab OT Goals Patient Stated Goal: to go home OT Goal Formulation: With patient Time For Goal Achievement: 01/03/18 Potential to Achieve Goals: Good  OT Frequency: Min 3X/week              AM-PAC PT "6 Clicks" Daily Activity     Outcome Measure Help from another person eating meals?: None Help from another person taking care of personal grooming?: None Help from another person toileting, which includes using toliet, bedpan, or urinal?: A Lot Help from another person bathing (including washing, rinsing, drying)?: A Lot Help from another person to put on and taking off regular upper body clothing?: A Little Help from another person to put on and taking off regular lower body  clothing?: Total 6 Click Score: 16   End of Session Equipment Utilized During Treatment: Gait belt;Rolling walker;Oxygen(4 liters) Nurse Communication: (NT: pt reported to me that when she coughed she urinated a little in on pad in recliner, but did not feel like standing out right now again to get pad out from under her and that she would let NT know when she was ready to stand up)  Activity Tolerance: Patient limited by fatigue(did get dizzy times 1 on second stand) Patient left: in chair;with call bell/phone within reach;with family/visitor present  OT Visit Diagnosis: Unsteadiness on feet (R26.81);Other abnormalities of gait and mobility (R26.89);Muscle weakness (generalized) (M62.81);Pain Pain - part of body: (incisional site, abdomen)                Time: 7494-4967 OT Time Calculation (min): 52 min Charges:  OT General Charges $OT Visit: 1 Visit OT Evaluation $OT Eval Moderate Complexity: 1 Mod OT Treatments $Self Care/Home Management : 23-37 mins [place], OTR/L [phone] 12/20/2017

## 2017-12-20 NOTE — Evaluation (Signed)
Physical Therapy Evaluation Patient Details Name: Kathleen Miller MRN: [id number] DOB: 03/17/1946 Today's Date: 12/20/2017   History of Present Illness  Pt is a 72 y.o. female with PMH of hernia repair (12/13/17), pre-diabetes, hypothyroidism, gastric bypass, who presented to ED on 12/17/17 with SOB; found to have bilateral submassive pulmonary embolism with cor pulmonale and RLE DVT. Critical care recommended medical management.    Clinical Impression  Pt presents with an overall decrease in functional mobility secondary to above. PTA, pt mod indep with mobility using SPC or RW. Will have 24/7 assist from family at d/c. Today, pt able to amb 87' with RW and min guard (+2 safety), as pt impulsive with movement and not receptive to feedback regarding safe mobility. Pt would benefit from continued acute PT services to maximize functional mobility and independence prior to d/c with HHPT services.     Follow Up Recommendations Home health PT;Supervision/Assistance - 24 hour    Equipment Recommendations  None recommended by PT    Recommendations for Other Services       Precautions / Restrictions Precautions Precautions: Fall Precaution Comments: abdominal incisions Restrictions Weight Bearing Restrictions: No      Mobility  Bed Mobility Overal bed mobility: Needs Assistance Bed Mobility: Sit to Supine   Sidelying to sit: Mod assist   Sit to supine: Mod assist   General bed mobility comments: Pt unwilling to attempt log roll technique for abdominal incision protection. ModA to assist BLEs back into bed; unreceptive to cues on technique to maintain independence with this, stating she has foot rests at home she has to use  Transfers Overall transfer level: Needs assistance Equipment used: Rolling walker (2 wheeled) Transfers: Sit to/from Stand Sit to Stand: Min guard Stand pivot transfers: Min assist;+2 physical assistance       General transfer comment: Cues for safe hand  placement  Ambulation/Gait Ambulation/Gait assistance: Min guard;+2 safety/equipment Ambulation Distance (Feet): 60 Feet Assistive device: Rolling walker (2 wheeled) Gait Pattern/deviations: Step-through pattern;Decreased stride length     General Gait Details: Pt fast and impulsive with amb trying to get it over quickly saying "you better keep up" despite cues for safety with lines/leads and activity pacing; also with significant SOB. Despite this, only requiring min guard for balance with RW; no physical assist neeed  Stairs            Wheelchair Mobility    Modified Rankin (Stroke Patients Only)       Balance Overall balance assessment: Needs assistance Sitting-balance support: No upper extremity supported;Feet supported Sitting balance-Leahy Scale: Fair     Standing balance support: Bilateral upper extremity supported;During functional activity Standing balance-Leahy Scale: Poor Standing balance comment: reliant on RW                             Pertinent Vitals/Pain Pain Assessment: Faces Pain Score: 5  Faces Pain Scale: Hurts little more Pain Location: incisonal site (abdomen) Pain Descriptors / Indicators: Sore Pain Intervention(s): Monitored during session;Limited activity within patient's tolerance    Home Living Family/patient expects to be discharged to:: Private residence Living Arrangements: Alone Available Help at Discharge: Family;Friend(s);Available PRN/intermittently(almost 24/7) Type of Home: House Home Access: Stairs to enter Entrance Stairs-Rails: None Entrance Stairs-Number of Steps: 1 Home Layout: One level Home Equipment: Tub bench;Cane - single point;Walker - 4 wheels;Walker - 2 wheels;Grab bars - toilet;Grab bars - tub/shower;Hand held shower head;Adaptive equipment Additional Comments: Brother stands to stay with  pt at d/c through Sunday (1/27). After this, neighbor available for PRN assist    Prior Function Level of  Independence: Independent with assistive device(s)         Comments: Uses SPC or RW for household amb; uses rollator when out and about or longer distances. Has made many home modifications due to chronic back issues     Hand Dominance   Dominant Hand: Right    Extremity/Trunk Assessment   Upper Extremity Assessment Upper Extremity Assessment: Generalized weakness    Lower Extremity Assessment Lower Extremity Assessment: Generalized weakness       Communication   Communication: No difficulties  Cognition Arousal/Alertness: Awake/alert Behavior During Therapy: Anxious Overall Cognitive Status: Within Functional Limits for tasks assessed                                 General Comments: Pt very particular throughout session, and not very responsive to feedback on potential activity/mobility modifications. Anxious about current condition and SOB      General Comments General comments (skin integrity, edema, etc.): Pt had mentioned she needed to use BSC when we returned to room; sat EOB post-amb and when asked if she was ready to use BSC, pt stating, "I don't have the energy, can't they just put the purewick in me?" Educ on the fact that she is able to amb into/out of bathroom and does not need purewick    Exercises     Assessment/Plan    PT Assessment Patient needs continued PT services  PT Problem List Decreased strength;Decreased activity tolerance;Decreased balance;Decreased mobility;Decreased knowledge of use of DME;Decreased safety awareness;Pain       PT Treatment Interventions DME instruction;Gait training;Stair training;Functional mobility training;Therapeutic activities;Therapeutic exercise;Balance training;Patient/family education    PT Goals (Current goals can be found in the Care Plan section)  Acute Rehab PT Goals Patient Stated Goal: to go home PT Goal Formulation: With patient Time For Goal Achievement: 01/03/18 Potential to Achieve  Goals: Good    Frequency Min 3X/week   Barriers to discharge        Co-evaluation               AM-PAC PT "6 Clicks" Daily Activity  Outcome Measure Difficulty turning over in bed (including adjusting bedclothes, sheets and blankets)?: Unable Difficulty moving from lying on back to sitting on the side of the bed? : Unable Difficulty sitting down on and standing up from a chair with arms (e.g., wheelchair, bedside commode, etc,.)?: A Little Help needed moving to and from a bed to chair (including a wheelchair)?: A Little Help needed walking in hospital room?: A Little Help needed climbing 3-5 steps with a railing? : A Little 6 Click Score: 14    End of Session Equipment Utilized During Treatment: Gait belt Activity Tolerance: Patient tolerated treatment well Patient left: in bed;with call bell/phone within reach;with bed alarm set;with SCD's reapplied Nurse Communication: Mobility status PT Visit Diagnosis: Other abnormalities of gait and mobility (R26.89);Muscle weakness (generalized) (M62.81)    Time: 5284-1324 PT Time Calculation (min) (ACUTE ONLY): 27 min   Charges:   PT Evaluation $PT Eval Moderate Complexity: 1 Mod PT Treatments $Gait Training: 8-22 mins   PT G Codes:       Mabeline Caras, PT, DPT Acute Rehab Services  Pager: [place] 12/20/2017, 2:38 PM

## 2017-12-20 NOTE — Telephone Encounter (Signed)
Transition Care Management Follow-up Telephone Call  ADMISSION DATE: 12/13/2017  DISCHARGE DATE:12/14/2017    How have you been since you were released from the hospital? Doing fair some pain and dizziness when walking.   Do you understand why you were in the hospital? Yes Hernia per patient   Do you understand the discharge instrcutions? Yes has help understanding    Items Reviewed: Medications reviewed: Not taking per hospital discharge orders  Allergies reviewed: Yes   Dietary changes reviewed: Regular   Referrals reviewed: Appointment scheduled for Hospital Follow Up   Functional Questionnaire:   Activities of Daily Living (ADLs):  Has help with bathing  Any patient concerns? Poor appite,pain continues   Confirmed importance and date/time of follow-up visits scheduled: Yes   Confirmed with patient if condition begins to worsen call PCP or go to the ER. Yes    Patient was given the office number and encouragred to call back with questions or concerns. Yes

## 2017-12-20 NOTE — Care Management (Signed)
Benefit Check results  ELIQUIS  2.5 MG BID  COVER- YES  CO-PAY- $ 47.00  TIER- 3 DRUG  PRIOR APPROVAL- NO   2. ELIQUIS 5 MG BID  COVER- YES  CO-PAY- $ 47.00  TIER- 3 DRUG  PRIOR APPROVAL-NO   3. XARELTO  15 MG BID  COVER- YES  CO-PAY- $ 47.00  TIER- 3 DRUG  PRIOR APPROVAL- NO   4. XARETLO 20 MG DAILY  COVER- YES  CO-PAY- $ 47.00  TIER- 3 DRUG  PRIOR APPROVAL- NO    PREFERRED PHARMACY : [facility], [facility], [facility] AND  [facility]

## 2017-12-21 DIAGNOSIS — D72829 Elevated white blood cell count, unspecified: Secondary | ICD-10-CM | POA: Diagnosis not present

## 2017-12-21 DIAGNOSIS — I5042 Chronic combined systolic (congestive) and diastolic (congestive) heart failure: Secondary | ICD-10-CM | POA: Diagnosis not present

## 2017-12-21 DIAGNOSIS — E118 Type 2 diabetes mellitus with unspecified complications: Secondary | ICD-10-CM | POA: Diagnosis not present

## 2017-12-21 DIAGNOSIS — N179 Acute kidney failure, unspecified: Secondary | ICD-10-CM | POA: Diagnosis not present

## 2017-12-21 DIAGNOSIS — I5041 Acute combined systolic (congestive) and diastolic (congestive) heart failure: Secondary | ICD-10-CM | POA: Diagnosis not present

## 2017-12-21 DIAGNOSIS — E871 Hypo-osmolality and hyponatremia: Secondary | ICD-10-CM | POA: Diagnosis not present

## 2017-12-21 DIAGNOSIS — J80 Acute respiratory distress syndrome: Secondary | ICD-10-CM | POA: Diagnosis not present

## 2017-12-21 DIAGNOSIS — I2699 Other pulmonary embolism without acute cor pulmonale: Secondary | ICD-10-CM | POA: Diagnosis not present

## 2017-12-21 LAB — CBC
HEMATOCRIT: 35.6 % — AB (ref 36.0–46.0)
Hemoglobin: 11.3 g/dL — ABNORMAL LOW (ref 12.0–15.0)
MCH: 26.4 pg (ref 26.0–34.0)
MCHC: 31.7 g/dL (ref 30.0–36.0)
MCV: 83.2 fL (ref 78.0–100.0)
Platelets: 359 10*3/uL (ref 150–400)
RBC: 4.28 MIL/uL (ref 3.87–5.11)
RDW: 15 % (ref 11.5–15.5)
WBC: 14.5 10*3/uL — AB (ref 4.0–10.5)

## 2017-12-21 MED ORDER — ELIQUIS 5 MG VTE STARTER PACK: ORAL_TABLET | ORAL | 0 refills | Status: DC

## 2017-12-21 NOTE — Progress Notes (Signed)
Kathleen Miller to be D/C'd Home per MD order.  Discussed with the patient and all questions fully answered.  VSS, Skin clean, dry and intact without evidence of skin break down, no evidence of skin tears noted. IV catheter discontinued intact. Site without signs and symptoms of complications. Dressing and pressure applied.  An After Visit Summary was printed and given to the patient. Patient received prescription.  D/c education completed with patient/family including follow up instructions, medication list, d/c activities limitations if indicated, with other d/c instructions as indicated by MD - patient able to verbalize understanding, all questions fully answered.   Patient instructed to return to ED, call 911, or call MD for any changes in condition.   Allergies as of 12/21/2017      Reactions   Nsaids Swelling, Other (See Comments)   Severe swelling--water retention (including water on brain)   Tolmetin Swelling, Other (See Comments)   Severe swelling-water retention (including water on brain)      Medication List    STOP taking these medications   aspirin EC 81 MG tablet     TAKE these medications   cyclobenzaprine 10 MG tablet Commonly known as:  FLEXERIL Take 10 mg by mouth 3 (three) times daily as needed for muscle spasms.   ELIQUIS STARTER PACK 5 MG Tabs Take as directed on package: start with two-5mg  tablets twice daily for 7 days. On day 8, switch to one-5mg  tablet twice daily.   furosemide 40 MG tablet Commonly known as:  LASIX Take 1 tablet (40 mg total) by mouth daily. What changed:    when to take this  additional instructions   lovastatin 20 MG tablet Commonly known as:  MEVACOR Take 1 tablet (20 mg total) by mouth at bedtime.   metFORMIN 500 MG tablet Commonly known as:  GLUCOPHAGE Take 1 tablet (500 mg total) by mouth 2 (two) times daily with a meal.   NP THYROID 60 MG tablet Generic drug:  thyroid Take 60 mg by mouth daily before breakfast.    ondansetron 4 MG tablet Commonly known as:  ZOFRAN Take 4 mg by mouth 3 (three) times daily as needed for nausea/vomiting.   traMADol 50 MG tablet Commonly known as:  ULTRAM Take 1 tablet (50 mg total) by mouth every 6 (six) hours as needed for moderate pain.   vitamin C 1000 MG tablet Take 1,000 mg by mouth daily.            Durable Medical Equipment  (From admission, onward)        Start     Ordered   12/21/17 1415  For home use only DME oxygen  Once    Question Answer Comment  Mode or (Route) Nasal cannula   Liters per Minute 3   Frequency Continuous (stationary and portable oxygen unit needed)   Oxygen delivery system Gas      12/21/17 1414    patient is awaiting PTAR for transport home.   Betha Loa Etienne Millward 12/21/2017 4:22 PM

## 2017-12-21 NOTE — Progress Notes (Signed)
PT Cancellation Note  Patient Details Name: Kathleen Miller MRN: [id number] DOB: [birth year]   Cancelled Treatment:    Reason Eval/Treat Not Completed: (P) Other (comment)(RN in room rounding on patient, d/c complete and patient awaiting PTAR for transport home with defer PT needs to [facility] services.  )   Cristela Blue 12/21/2017, 5:07 PM  Governor Rooks, PTA pager [phone]

## 2017-12-21 NOTE — Care Management Note (Addendum)
Case Management Note  Patient Details  Name: Kathleen Miller MRN: [id number] Date of Birth: November 23, 1946  Subjective/Objective:       Admitted pulmonary embolism, hx of diabetes mellitus type 2, hyperlipidemia, chronic renal failure, diastolic CHF, recent ventral hernia repair. Owns cane , walker.  PCP: Silvestre Mesi  Action/Plan: Pt declines/REFUSES CIR. States going home with home health services to follow. States family(brother) and next door neighbor(Mary) will assist with care once d/c. 30 DAY FREE  ELIQUIS CARD GIVEN TO PATIENT AND USAGE EXPLAINED PER CM.   Brother to provide transportation to home.  Expected Discharge Date:   12/21/2016           Expected Discharge Plan:  [place]  In-House Referral:     Discharge planning Services  CM Consult  Post Acute Care Choice:    Choice offered to:  Patient  DME Arranged:   oxygen DME Agency:   [facility]  HH Arranged:   PT,OT,NA,SW HH Agency:    [facility]  Status of Service:  Completed, signed off  If discussed at [place] of Stay Meetings, dates discussed:    Additional Comments:  Sharin Mons, RN 12/21/2017, 1:21 PM

## 2017-12-21 NOTE — Progress Notes (Addendum)
Pt will need PTAR transportation to home, pt unable to get in brother's truck. Home oxygen will be delivered to pt's residence prior to pt d/c.Brother, Hedy Camara [phone]) to receive oxygen from [facility] [phone], [phone]) @ pt's home. Once oxygen received transportation to home with PTAR can be arranged. CM made nurse aware. Whitman Hero RN,BSN,CM

## 2017-12-21 NOTE — Progress Notes (Signed)
PROGRESS NOTE    DEISSY GUILBERT  LNL:[id number] DOB: [birth year] DOA: 12/17/2017 PCP: Darreld Mclean, MD   Brief Narrative:  72 year old female with history of diabetes mellitus type 2, hyperlipidemia, chronic renal failure, diastolic CHF, recent ventral hernia repair came to the hospital with complains of shortness of breath.  She was found to have at least submassive PE bilaterally with right heart strain and elevated troponin.  Critical care team was consulted in the ER who recommended medical management.  Patient was admitted to the hospital on heparin drip. Ventimask has been transitioned to supplemental oxygen by nasal cannula. Eliquis was started and the patient transferred to the floor 1/22.   Assessment & Plan:   Principal Problem:   Pulmonary embolism (HCC) Active Problems:   Diabetes ([place])   Leukocytosis   Hyponatremia   ARF (acute renal failure) (HCC)   Pressure injury of skin  Bilateral submassive pulmonary embolism with cor pulmonale: Could be a result of recent surgery versus her sedentary lifestyle with frequent long drives of about 3-4 hours daily. Normal estimated pulmonary artery pressure on echo.  - Continue eliquis. CM states co-pay is $47/month without need for prior authorization. Pt declines coumadin, though states this will present financial struggles. - Weaned oxygen to 3L, anticipate ongoing need for this.   Weakness: Acute on chronic due to dyspnea related to PE. Has become further deconditioned as a result of recent surgery and PE.  - Strongly feel rehabilitation stay will be necessary. Pt agrees but refuses SNF for now. She formerly refused CIR, but is now very interested. Will consult PM&R for evaluation.  Type II NSTEMI: Troponin elevation improved, mild due to right heart strain.  - No interventions planned.  - No ischemic changes, chest pain or new arrhythmias. Will DC telemetry.   Recent ventral hernia repair: No complications.  - WOC consulted,  stated no further interventions necessary.  - Follow up with general surgery as previously scheduled next week. Appreciate Dr. Excell Seltzer visiting patient while admitted.   Acute kidney injury: Baseline creatinine 0.7, up to 1.49 on admission, now resolved. - Avoid nephrotoxic drugs  T2DM: HbA1c 6.1% - Stopped SSI given tight control at home and thus far.  - Hold metformin  Hypothyroidism - Continue thyroid [place]  Hyperlipidemia - Ordered formulary statin  DVT prophylaxis: Eliquis Code Status: Full code Family Communication: None at bedside this AM Disposition Plan: Stable for discharge but will require inpatient/residential rehabilitation. Not safe for disposition to home. CIR consulted for evaluation.    Consultants:   Critical care curbside in the ER  CIR  Procedures:   None  Antimicrobials:   None  Subjective: Had choking episode early this morning which scared her, but states her dyspnea is stable, worse with exertion, relatively unchanged from yesterday. No fever or productive cough. Back pain is stable. Requests flexeril prior to PT. Amenable to CIR.   Objective: Vitals:   12/20/17 1713 12/20/17 1857 12/20/17 2152 12/21/17 0418  BP: 125/79 116/68 102/64 114/73  Pulse:   86 86  Resp:    20  Temp: 98.6 F (37 C) 98.5 F (36.9 C) (!) 97.2 F (36.2 C) 97.6 F (36.4 C)  TempSrc: Oral Oral Oral Oral  SpO2:  93% 93% 92%  Weight:    129.7 kg (286 lb)  Height:        Intake/Output Summary (Last 24 hours) at 12/21/2017 1108 Last data filed at 12/21/2017 0422 Gross per 24 hour  Intake 480 ml  Output  500 ml  Net -20 ml   Filed Weights   12/19/17 0500 12/20/17 0525 12/21/17 0418  Weight: 128.3 kg (282 lb 13.6 oz) 130 kg (286 lb 9.6 oz) 129.7 kg (286 lb)   Gen: Pleasant female in no distress HEENT: PERRL, MMM, posterior oropharynx clear, edentulous with dentures x2.  Pulm: Labored tachypnea with minimal exertion (rising for posterior lung exam with max  assist), diminished without crackles or wheezes.  CV: Regular rate, no murmur; distal pulses intact/symmetric; Trace LE edema GI: + BS; soft, non-tender, non-distended, no HSM, no hernia Skin: Ventral abdominal incision c/d/i with staples in place. MSK: Normal muscle bulk. No digital clubbing/cyanosis Neuro: CN II-XII without deficits, sensation intact to light touch Psych: A&Ox3, mood anxious with congruent affect, speech clear and coherent  Data Reviewed:   CBC: Recent Labs  Lab 12/18/17 0130 12/18/17 0324 12/19/17 0221 12/20/17 0318 12/21/17 0233  WBC 17.7* 17.4* 18.0* 14.4* 14.5*  HGB 11.2* 11.1* 10.8* 10.6* 11.3*  HCT 34.8* 34.5* 34.4* 34.1* 35.6*  MCV 81.1 81.2 83.5 84.0 83.2  PLT 329 338 342 343 397   Basic Metabolic Panel: Recent Labs  Lab 12/17/17 1602 12/18/17 0324 12/20/17 0318  NA 133* 134* 134*  K 4.3 4.0 3.8  CL 97* 100* 101  CO2 20* 21* 22  GLUCOSE 156* 165* 110*  BUN 29* 34* 23*  CREATININE 1.49* 1.41* 0.75  CALCIUM 8.6* 8.3* 8.4*   GFR: Estimated Creatinine Clearance: 88.7 mL/min (by C-G formula based on SCr of 0.75 mg/dL). Liver Function Tests: Recent Labs  Lab 12/18/17 0324  AST 84*  ALT 66*  ALKPHOS 107  BILITOT 0.9  PROT 6.7  ALBUMIN 3.0*   Cardiac Enzymes: Recent Labs  Lab 12/17/17 2102 12/18/17 0324 12/18/17 1109  TROPONINI 0.35* 0.37* 0.21*   BNP (last 3 results) No results for input(s): PROBNP in the last 8760 hours. HbA1C: No results for input(s): HGBA1C in the last 72 hours. CBG: Recent Labs  Lab 12/19/17 1636 12/19/17 2249 12/20/17 0804 12/20/17 1249 12/20/17 1708  GLUCAP 113* 119* 108* 139* 150*    Recent Results (from the past 240 hour(s))  MRSA PCR Screening     Status: None   Collection Time: 12/18/17 10:58 AM  Result Value Ref Range Status   MRSA by PCR NEGATIVE NEGATIVE Final    Comment:        The GeneXpert MRSA Assay (FDA approved for NASAL specimens only), is one component of a comprehensive MRSA  colonization surveillance program. It is not intended to diagnose MRSA infection nor to guide or monitor treatment for MRSA infections.      Radiology Studies: No results found. Scheduled Meds: . apixaban  10 mg Oral BID  . [START ON 12/27/2017] apixaban  5 mg Oral BID  . pravastatin  20 mg Oral q1800  . thyroid  60 mg Oral QAC breakfast   Continuous Infusions:    LOS: 4 days   Time spent: 35 mins  Vance Gather, MD [facility] Pager [phone]   If 7PM-7AM, please contact night-coverage [email or web address] Password [facility] 12/21/2017, 11:08 AM

## 2017-12-21 NOTE — Progress Notes (Signed)
Patient ID: Kathleen Miller, female   DOB: [birth year], 72 y.o.   MRN: [id number]     Subjective: Had a cough last night and feel short of breath with exertion.  Denies abdominal pain or nausea.  Objective: Vital signs in last 24 hours: Temp:  [97.2 F (36.2 C)-98.6 F (37 C)] 97.6 F (36.4 C) (01/23 0418) Pulse Rate:  [85-86] 86 (01/23 0418) Resp:  [20-25] 20 (01/23 0418) BP: (102-125)/(64-79) 114/73 (01/23 0418) SpO2:  [91 %-93 %] 92 % (01/23 0418) Weight:  [129.7 kg (286 lb)] 129.7 kg (286 lb) (01/23 0418) Last BM Date: 12/18/17  Intake/Output from previous day: 01/22 0701 - 01/23 0700 In: 480 [P.O.:480] Out: 500 [Urine:500] Intake/Output this shift: No intake/output data recorded.  General appearance: alert, cooperative and no distress GI: normal findings: soft, non-tender and Nondistended Lungs: No wheezing or increased work of breathing Incision/Wound: No erythema or drainage.  Staples intact.  Lab Results:  Recent Labs    12/20/17 0318 12/21/17 0233  WBC 14.4* 14.5*  HGB 10.6* 11.3*  HCT 34.1* 35.6*  PLT 343 359   BMET Recent Labs    12/20/17 0318  NA 134*  K 3.8  CL 101  CO2 22  GLUCOSE 110*  BUN 23*  CREATININE 0.75  CALCIUM 8.4*     Studies/Results: No results found.  Anti-infectives: Anti-infectives (From admission, onward)   None      Assessment/Plan: Admitted with bilateral PE 1 week following laparoscopic ventral hernia repair. Note that she had a remote open gastric bypass and not a recent gastric bypass Appreciate care of medical team.  Patient wants to return directly home and I did suggest to her that short-term placement for rehab may be the best course. Abdomen and wound look fine.  Plan to leave staples until next week.  I can see her back in the office in 1 week.    LOS: 4 days    Edward Jolly 12/21/2017

## 2017-12-21 NOTE — Discharge Summary (Addendum)
Physician Discharge Summary  Kathleen Miller:[id number] DOB: 28-Jul-1946 DOA: 12/17/2017  PCP: Darreld Mclean, MD  Admit date: 12/17/2017 Discharge date: 12/21/2017  Admitted From: Home Disposition: Home   Recommendations for Outpatient Follow-up:  1. Follow up with surgery next week as arranged 2. Follow up with PCP in the next 1 - 2 weeks. Started on eliquis and supplemental oxygen for PE.   Home Health: PT, OT, aide, CSW (maximized home health due to pt's refusal of SNF/CIR placement).  Equipment/Devices: None new, continue home RW, rollator, SPC.  Discharge Condition: Stable CODE STATUS: Full Diet recommendation: Heart healthy, carb-modified  Brief/Interim Summary: Kathleen Miller is a 72 year old female with history of diabetes mellitus type 2, hyperlipidemia, chronic renal failure, diastolic CHF, recent ventral hernia repair who came to the hospital with complains of shortness of breath. She was found to have at least submassive PE bilaterally with right heart strain and elevated troponin. Critical care team was consulted in the ER who recommended medical management. Patient was admitted to the hospital on heparin drip. Troponins trended downward and symptoms have improved. Ventimask has been transitioned to supplemental oxygen by nasal cannula which will be continued at discharge. Eliquis was started and the patient transferred to the floor 1/22, stable for discharge 1/23.   Discharge Diagnoses:  Principal Problem:   Pulmonary embolism (HCC) Active Problems:   Diabetes ([place])   Leukocytosis   Hyponatremia   ARF (acute renal failure) (HCC)   Pressure injury of skin  Bilateral submassive pulmonary embolism with cor pulmonale: Could be a result of recent surgery versus her sedentary lifestyle with frequent long drives of about 3-4 hours daily. Normal estimated pulmonary artery pressure on echo.  - Continue eliquis. CM states co-pay is $47/month without need for prior  authorization. Got 30-day card from CM. Pt declines coumadin, though states this will present financial struggles. Urged to apply for assistance through [place] oxygen to 3L, will discharge with this.  - No new pain medications were prescribed. [place] query demonstrated: 12/16/2017 2 Tramadol Hcl 50 MG Tablet #20 by Dr. Excell Seltzer in addition to #90 prescribed on 11/10/2017.  Right peroneal and tibial vein DVT: As above.   Weakness: Acute on chronic due to dyspnea related to PE. Has become further deconditioned as a result of recent surgery and PE.  - Strongly feel rehabilitation stay would benefit patient, though she continues to decline this. Will have 24 hr supervision/assistance by brother for the next week and intermittent assistance thereafter. Including CSW home health in case of need for placement.   Type II NSTEMI: Troponin elevation improved, mild due to right heart strain.  - No interventions planned. No ischemic changes, chest pain or new arrhythmias.  Recent ventral hernia repair: No complications.  - WOC consulted, stated no further interventions necessary.  - Follow up with general surgery as previously scheduled next week. Appreciate Dr. Excell Seltzer visiting patient while admitted.   Acute kidney injury: Baseline creatinine 0.7, up to 1.49 on admission, now resolved. - Avoid nephrotoxic drugs  T2DM: HbA1c 6.1% - Stopped SSI given tight control at home and thus far.  - Restart home medication.  Hypothyroidism - Continue thyroid [place]  Hyperlipidemia - Ordered formulary statin  Discharge Instructions Discharge Instructions    Diet - low sodium heart healthy   Complete by:  As directed    Discharge instructions   Complete by:  As directed    You were admitted for pulmonary emboli, clots in your lungs, which  have caused significant shortness of breath. You are stable for discharge with the following recommendations:  - Fill the prescription for eliquis  at your pharmacy with the 30-day card supplied to you. Follow up with your PCP in the next 1 - 2 weeks for ongoing refills.  - Follow up with your surgeon as scheduled - If your symptoms worsen, seek medical attention.  - If you are unable to safely stay at home and need short term rehabilitation, a social worker will be following you as an outpatient in addition to PT, OT, and an aide.   Increase activity slowly   Complete by:  As directed      Allergies as of 12/21/2017      Reactions   Nsaids Swelling, Other (See Comments)   Severe swelling--water retention (including water on brain)   Tolmetin Swelling, Other (See Comments)   Severe swelling-water retention (including water on brain)      Medication List    STOP taking these medications   aspirin EC 81 MG tablet     TAKE these medications   cyclobenzaprine 10 MG tablet Commonly known as:  FLEXERIL Take 10 mg by mouth 3 (three) times daily as needed for muscle spasms.   ELIQUIS STARTER PACK 5 MG Tabs Take as directed on package: start with two-5mg  tablets twice daily for 7 days. On day 8, switch to one-5mg  tablet twice daily.   furosemide 40 MG tablet Commonly known as:  LASIX Take 1 tablet (40 mg total) by mouth daily. What changed:    when to take this  additional instructions   lovastatin 20 MG tablet Commonly known as:  MEVACOR Take 1 tablet (20 mg total) by mouth at bedtime.   metFORMIN 500 MG tablet Commonly known as:  GLUCOPHAGE Take 1 tablet (500 mg total) by mouth 2 (two) times daily with a meal.   NP THYROID 60 MG tablet Generic drug:  thyroid Take 60 mg by mouth daily before breakfast.   ondansetron 4 MG tablet Commonly known as:  ZOFRAN Take 4 mg by mouth 3 (three) times daily as needed for nausea/vomiting.   traMADol 50 MG tablet Commonly known as:  ULTRAM Take 1 tablet (50 mg total) by mouth every 6 (six) hours as needed for moderate pain.   vitamin C 1000 MG tablet Take 1,000 mg by mouth  daily.            Durable Medical Equipment  (From admission, onward)        Start     Ordered   12/21/17 1415  For home use only DME oxygen  Once    Question Answer Comment  Mode or (Route) Nasal cannula   Liters per Minute 3   Frequency Continuous (stationary and portable oxygen unit needed)   Oxygen delivery system Gas      12/21/17 1414     Follow-up Information    [facility] Follow up.   Specialty:  [place] Why:  home home services arranged, office will call and set up home visits Contact information: [address] [place] [address] [phone]        [facility] - Dme Follow up.   Why:  home oxygen arranged Contact information: [place] [address] [phone]        Excell Seltzer, MD Follow up.   Specialty:  General Surgery Contact information: [place] [address] [phone]  Copland, Gay Filler, MD Follow up.   Specialty:  Family Medicine Contact information: [address] [place] [address] [phone]          Allergies  Allergen Reactions  . Nsaids Swelling and Other (See Comments)    Severe swelling--water retention (including water on brain)  . Tolmetin Swelling and Other (See Comments)    Severe swelling-water retention (including water on brain)     Consultations:  General surgery  CCM   Procedures/Studies: Ct Angio Chest Pe W And/or Wo Contrast  Result Date: 12/17/2017 CLINICAL DATA:  Recent hernia repair. Last night pt become weak and short of breath. Shortness of breath has worsened today. ^39mL ISOVUE-370 IOPAMIDOL (ISOVUE-370) INJECTION 76% EXAM: CT ANGIOGRAPHY CHEST WITH CONTRAST TECHNIQUE: Multidetector CT imaging of the chest was performed using the standard protocol during bolus administration of intravenous contrast. Multiplanar CT image reconstructions and MIPs were  obtained to evaluate the vascular anatomy. CONTRAST:  43mL ISOVUE-370 IOPAMIDOL (ISOVUE-370) INJECTION 76% COMPARISON:  Chest CT 12/17/2017 FINDINGS: Cardiovascular: There is a filling defect within proximal RIGHT upper lobe pulmonary artery which is occlusive (image 50, series 5). Filling defect within the RIGHT middle lobe pulmonary artery which is proximal. Segmental filling defects within the lower lobe pulmonary arteries on the RIGHT. Filling defects within the segmental pulmonary arteries in the LEFT lower lobe which are occlusive. Lingular filling defect distally. Probable upper lobe pulmonary filling defects. Findings consistent with bilateral multiple pulmonary emboli. The RIGHT ventricular to LEFT ventricular ratio is equal to 1.47 Mediastinum/Nodes: No axillary supraclavicular adenopathy. No mediastinal hilar adenopathy. No pericardial effusion. Lungs/Pleura: No pulmonary infarction identified. Mild atelectasis the RIGHT lung base. Upper Abdomen: Limited view of the liver, kidneys, pancreas are unremarkable. Normal adrenal glands. Musculoskeletal: No aggressive osseous lesion. Review of the MIP images confirms the above findings. IMPRESSION: 1. Bilateral occlusive pulmonary emboli involving the upper and lower lobes. Emboli involve the proximal segmental pulmonary arteries. 2. Positive for acute PE with CT evidence of right heart strain (RV/LV Ratio = 1.47) consistent with at least submassive (intermediate risk) PE. The presence of right heart strain has been associated with an increased risk of morbidity and mortality. Please activate Code PE by paging [phone]. Critical Value/emergent results were called by telephone at the time of interpretation on 12/17/2017 at 6:21 pm to Dr. Nat Christen , who verbally acknowledged these results. Electronically Signed   By: Suzy Bouchard M.D.   On: 12/17/2017 18:22   Dg Chest Portable 1 View  Result Date: 12/17/2017 CLINICAL DATA:  Chest pain and shortness of  breath EXAM: PORTABLE CHEST 1 VIEW COMPARISON:  May 10, 2016 FINDINGS: Cardiomegaly. The hila and mediastinum are unremarkable given the low volume portable technique. No pulmonary nodules or masses. No focal infiltrates. No overt edema. Continued elevation of the right hemidiaphragm. IMPRESSION: No active disease. Electronically Signed   By: Dorise Bullion III M.D   On: 12/17/2017 16:31   Subjective: Feels breathing is much better than admission, but still very dyspneic with exertion. No chest pain, palpitations, fevers, cough, lightheadedness.   Discharge Exam: BP 114/73 (BP Location: Left Arm)   Pulse 86   Temp 97.6 F (36.4 C) (Oral)   Resp 20   Ht 5\' 4"  (1.626 m)   Wt 129.7 kg (286 lb)   SpO2 92%   BMI 49.09 kg/m   General: Pt is alert, awake, not in acute distress Cardiovascular: RRR, S1/S2 +, no rubs, no gallops Respiratory: Nonlabored at  rest, clear bilaterally.  Abdominal: Soft, NT, ND, bowel sounds + Skin: Ventral abdominal incision c/d/i with staples in place.  Labs: Basic Metabolic Panel: Recent Labs  Lab 12/17/17 1602 12/18/17 0324 12/20/17 0318  NA 133* 134* 134*  K 4.3 4.0 3.8  CL 97* 100* 101  CO2 20* 21* 22  GLUCOSE 156* 165* 110*  BUN 29* 34* 23*  CREATININE 1.49* 1.41* 0.75  CALCIUM 8.6* 8.3* 8.4*   Liver Function Tests: Recent Labs  Lab 12/18/17 0324  AST 84*  ALT 66*  ALKPHOS 107  BILITOT 0.9  PROT 6.7  ALBUMIN 3.0*   CBC: Recent Labs  Lab 12/18/17 0130 12/18/17 0324 12/19/17 0221 12/20/17 0318 12/21/17 0233  WBC 17.7* 17.4* 18.0* 14.4* 14.5*  HGB 11.2* 11.1* 10.8* 10.6* 11.3*  HCT 34.8* 34.5* 34.4* 34.1* 35.6*  MCV 81.1 81.2 83.5 84.0 83.2  PLT 329 338 342 343 359   Cardiac Enzymes: Recent Labs  Lab 12/17/17 2102 12/18/17 0324 12/18/17 1109  TROPONINI 0.35* 0.37* 0.21*   CBG: Recent Labs  Lab 12/19/17 1636 12/19/17 2249 12/20/17 0804 12/20/17 1249 12/20/17 1708  GLUCAP 113* 119* 108* 139* 150*   Urinalysis     Component Value Date/Time   COLORURINE YELLOW 12/18/2017 1837   APPEARANCEUR CLOUDY (A) 12/18/2017 1837   LABSPEC 1.041 (H) 12/18/2017 1837   PHURINE 5.0 12/18/2017 1837   GLUCOSEU NEGATIVE 12/18/2017 1837   HGBUR NEGATIVE 12/18/2017 1837   BILIRUBINUR NEGATIVE 12/18/2017 1837   KETONESUR 5 (A) 12/18/2017 1837   PROTEINUR 30 (A) 12/18/2017 1837   NITRITE NEGATIVE 12/18/2017 1837   LEUKOCYTESUR NEGATIVE 12/18/2017 1837    Microbiology Recent Results (from the past 240 hour(s))  MRSA PCR Screening     Status: None   Collection Time: 12/18/17 10:58 AM  Result Value Ref Range Status   MRSA by PCR NEGATIVE NEGATIVE Final    Comment:        The GeneXpert MRSA Assay (FDA approved for NASAL specimens only), is one component of a comprehensive MRSA colonization surveillance program. It is not intended to diagnose MRSA infection nor to guide or monitor treatment for MRSA infections.     Time coordinating discharge: Approximately 40 minutes  Vance Gather, MD  [facility] 12/21/2017, 2:24 PM Pager [phone]

## 2017-12-21 NOTE — Progress Notes (Signed)
Order to discontinue cardiac monitoring placed, CCMD and [place] nurse secretary notified of change, monitor removed.  

## 2017-12-21 NOTE — Care Management Important Message (Signed)
Important Message  Patient Details  Name: Kathleen Miller MRN: [id number] Date of Birth: 01/05/1946   Medicare Important Message Given:  Yes    Orbie Pyo 12/21/2017, 12:40 PM

## 2017-12-21 NOTE — Progress Notes (Signed)
SATURATION QUALIFICATIONS: (This note is used to comply with regulatory documentation for home oxygen)  Patient Saturations on Room Air at Rest = 85%  Patient Saturations on Room Air while Ambulating = 70%  Patient Saturations on 3 Liters of oxygen while Ambulating = 93%

## 2017-12-21 NOTE — Progress Notes (Signed)
Physical medicine rehabilitation consult requested chart reviewed. Physical and occupational therapy evaluations have been completed and recommendations are for home health therapies. Patient adamantly refused inpatient rehabilitation services and stated she wanted to be discharged to home and has assistance at home. Hold on formal rehabilitation consult at this time with recommendations of discharge to home

## 2017-12-22 ENCOUNTER — Telehealth: Payer: Self-pay

## 2017-12-22 DIAGNOSIS — R059 Cough, unspecified: Secondary | ICD-10-CM

## 2017-12-22 DIAGNOSIS — R05 Cough: Secondary | ICD-10-CM

## 2017-12-22 NOTE — Telephone Encounter (Signed)
Patient notified medication sent to pharmacy and order for CXR placed.

## 2017-12-23 DIAGNOSIS — I130 Hypertensive heart and chronic kidney disease with heart failure and stage 1 through stage 4 chronic kidney disease, or unspecified chronic kidney disease: Secondary | ICD-10-CM | POA: Diagnosis not present

## 2017-12-23 DIAGNOSIS — E1122 Type 2 diabetes mellitus with diabetic chronic kidney disease: Secondary | ICD-10-CM | POA: Diagnosis not present

## 2017-12-23 DIAGNOSIS — I2699 Other pulmonary embolism without acute cor pulmonale: Secondary | ICD-10-CM | POA: Diagnosis not present

## 2017-12-23 DIAGNOSIS — I5043 Acute on chronic combined systolic (congestive) and diastolic (congestive) heart failure: Secondary | ICD-10-CM | POA: Diagnosis not present

## 2017-12-23 DIAGNOSIS — M48061 Spinal stenosis, lumbar region without neurogenic claudication: Secondary | ICD-10-CM | POA: Diagnosis not present

## 2017-12-26 ENCOUNTER — Ambulatory Visit (INDEPENDENT_AMBULATORY_CARE_PROVIDER_SITE_OTHER): Payer: Medicare HMO | Admitting: Family Medicine

## 2017-12-26 ENCOUNTER — Telehealth: Payer: Self-pay | Admitting: Family Medicine

## 2017-12-26 ENCOUNTER — Inpatient Hospital Stay: Payer: Medicare HMO | Admitting: Family Medicine

## 2017-12-26 ENCOUNTER — Encounter: Payer: Self-pay | Admitting: Family Medicine

## 2017-12-26 VITALS — BP 119/82 | HR 74 | Temp 98.3°F | Ht 64.0 in

## 2017-12-26 DIAGNOSIS — I5043 Acute on chronic combined systolic (congestive) and diastolic (congestive) heart failure: Secondary | ICD-10-CM | POA: Diagnosis not present

## 2017-12-26 DIAGNOSIS — I5042 Chronic combined systolic (congestive) and diastolic (congestive) heart failure: Secondary | ICD-10-CM

## 2017-12-26 DIAGNOSIS — E785 Hyperlipidemia, unspecified: Secondary | ICD-10-CM

## 2017-12-26 DIAGNOSIS — R600 Localized edema: Secondary | ICD-10-CM | POA: Diagnosis not present

## 2017-12-26 DIAGNOSIS — I2602 Saddle embolus of pulmonary artery with acute cor pulmonale: Secondary | ICD-10-CM | POA: Diagnosis not present

## 2017-12-26 DIAGNOSIS — M48061 Spinal stenosis, lumbar region without neurogenic claudication: Secondary | ICD-10-CM | POA: Diagnosis not present

## 2017-12-26 DIAGNOSIS — I130 Hypertensive heart and chronic kidney disease with heart failure and stage 1 through stage 4 chronic kidney disease, or unspecified chronic kidney disease: Secondary | ICD-10-CM | POA: Diagnosis not present

## 2017-12-26 DIAGNOSIS — I2699 Other pulmonary embolism without acute cor pulmonale: Secondary | ICD-10-CM | POA: Diagnosis not present

## 2017-12-26 DIAGNOSIS — E1122 Type 2 diabetes mellitus with diabetic chronic kidney disease: Secondary | ICD-10-CM | POA: Diagnosis not present

## 2017-12-26 MED ORDER — FUROSEMIDE 20 MG PO TABS: 40.0000 mg | ORAL_TABLET | Freq: Every day | ORAL | 2 refills | Status: DC

## 2017-12-26 MED ORDER — LOVASTATIN 20 MG PO TABS: 20.0000 mg | ORAL_TABLET | Freq: Every day | ORAL | 3 refills | Status: DC

## 2017-12-26 NOTE — Patient Instructions (Addendum)
Continue taking 40 mg of furosemide for the time being I will check your labs and be in touch with your asap Continue the eliquis I am going to refer you to pulmonology and also to see your cardiologist   Please see me in one week   Please weigh yourself at home daily in the morning

## 2017-12-26 NOTE — Progress Notes (Signed)
Pre visit review using our clinic tool,if applicable. No additional management support is needed unless otherwise documented below in the visit note.  

## 2017-12-26 NOTE — Progress Notes (Addendum)
[place] at [facility] [address], [place], [place] [address] [phone]  Date:  12/26/2017   Name:  Kathleen Miller   DOB:  1946/03/19   MRN:  [id number]  PCP:  Darreld Mclean, MD    Chief Complaint: Leg Swelling   History of Present Illness:  Kathleen Miller is a 72 y.o. very pleasant female patient who presents with the following:  We last visited in May - here today for a hospital follow-up visit She was admitted for ventral hernia repeat from 1/15 to 1/16.  Then ended up back in the ER with SOB- found to have bilateral PE  Admit date: 12/17/2017 Discharge date: 12/21/2017 Recommendations for Outpatient Follow-up:  1. Follow up with surgery next week as arranged 2. Follow up with PCP in the next 1 - 2 weeks. Started on eliquis and supplemental oxygen for PE.   Home Health: PT, OT, aide, CSW (maximized home health due to pt's refusal of SNF/CIR placement).  Equipment/Devices: None new, continue home RW, rollator, SPC.  Discharge Condition: Stable CODE STATUS: Full Diet recommendation: Heart healthy, carb-modified  Brief/Interim Summary: Kathleen Miller is a 72 year old female with history of diabetes mellitus type 2, hyperlipidemia, chronic renal failure, diastolic CHF, recent ventral hernia repair who came to the hospital with complains of shortness of breath. She was found to have at least submassive PE bilaterally with right heart strain and elevated troponin.Critical care team was consulted in the ER who recommended medical management. Patient was admitted to the hospital on heparin drip. Troponins trended downward and symptoms have improved. Ventimask has been transitioned to supplemental oxygen by nasal cannula which will be continued at discharge. Eliquis was started and the patient transferred to the floor 1/22, stable for discharge 1/23.   Discharge Diagnoses:  Principal Problem:   Pulmonary embolism  (HCC) Active Problems:   Diabetes ([place])   Leukocytosis   Hyponatremia   ARF (acute renal failure) (HCC)   Pressure injury of skin  Bilateral submassive pulmonary embolism with cor pulmonale: Could be a result of recent surgery versus her sedentary lifestyle with frequent long drives of about 3-4 hours daily. Normal estimated pulmonary artery pressure on echo.  -Continue eliquis.CM states co-pay is $47/month without need for prior authorization. Got 30-day card from CM. Pt declines coumadin, though states this will present financial struggles. Urged to apply for assistance through [employer] oxygen to 3L, will discharge with this.  - No new pain medications were prescribed. [place] query demonstrated: 12/16/2017 2 Tramadol Hcl 50 MG Tablet #20 by Dr. Excell Seltzer in addition to #90 prescribed on 11/10/2017. Weakness: Acute on chronic due to dyspnea related to PE. Has become further deconditioned as a result of recent surgery and PE.  - Strongly feel rehabilitation stay would benefit patient, though she continues to decline this. Will have 24 hr supervision/assistance by brother for the next week and intermittent assistance thereafter. Including CSW home health in case of need for placement.  Type II NSTEMI: Troponin elevation improved, mild due to right heart strain.  - No interventions planned.No ischemic changes, chest pain or new arrhythmias. Recent ventral hernia repair: No complications.  - WOC consulted, stated no further interventions necessary.  - Follow up with general surgery as previously schedulednext week.Appreciate Dr. Excell Seltzer visiting patient while admitted. Acute kidney injury: Baseline creatinine 0.7, up to 1.49 on admission, now resolved. - Avoid nephrotoxic drugs T2DM: HbA1c 6.1% -StoppedSSI given tight  control at home and thus far.  - Restart home medication. Hypothyroidism - Continue thyroid [place] Hyperlipidemia - Ordered formulary statin  She is on  eliquis right now, wonders how long she will need to continue this She was sent home on oxygen- [facility] She is not sure how long she will be on the oxygen Going off her oxygen for a few minutes like to go from her car to clinic caused her to [place] to 90 and to feel quite SOB Put back on oxygen in clinic- sat to 98% on 3L [place]  She is able to walk around the house ok as long as she has her oxygen on.    OT is seeing her  The am she awoke and noted that her ankles were very swollen- this concerned her a lot She took a lasix this morning and it did help, she has urinated quite a bit and her ankles are improved In the past she had been taking lasix just on the weekends as needed for LE edena Her swelling was worse today than in the recent past   She had an echo on 12/19/17  - Left ventricle: The cavity size was normal. Systolic function was   normal. The estimated ejection fraction was in the range of 60%   to 65%. Wall motion was normal; there were no regional wall   motion abnormalities. Doppler parameters are consistent with   abnormal left ventricular relaxation (grade 1 diastolic   dysfunction). - Mitral valve: Calcified annulus. - Left atrium: The atrium was moderately dilated. - Right ventricle: The cavity size was moderately dilated. Wall   thickness was normal. - Right atrium: The atrium was mildly dilated.  She feels able to drive and is able to walk around her house. Overall she feels like she is getting better, but she is frustrated by her oxygen tank.  Also, she was not really aware that she had an NSTEMI.  We discussed this together Her cardiologist is Dr. Johnsie Cancel.  She does not yet have a pulmonologist  She does not own a scale  Wt Readings from Last 3 Encounters:  12/21/17 286 lb (129.7 kg)  12/13/17 266 lb (120.7 kg)  12/12/17 266 lb (120.7 kg)    Patient Active Problem List   Diagnosis Date Noted  . Pressure injury of skin 12/20/2017  .  Pulmonary embolism ([place]) 12/17/2017  . Leukocytosis 12/17/2017  . Hyponatremia 12/17/2017  . ARF (acute renal failure) ([place]) 12/17/2017  . Incisional hernia 12/13/2017  . Chest pain 08/05/2017  . Abnormal nuclear stress test 08/05/2017  . Low bone mass 02/23/2017  . Chronic pain syndrome 07/18/2016  . Dyslipidemia 07/12/2016  . LUQ pain 05/27/2016  . GERD (gastroesophageal reflux disease) 05/27/2016  . Encounter for screening mammogram for breast cancer 11/26/2014  . Slow transit constipation 06/06/2014  . Diabetes ([place]) 12/04/2013  . Chronic combined systolic and diastolic heart failure ([place]) 12/04/2013  . Essential hypertension, benign 12/04/2013  . Spinal stenosis of lumbar region 12/04/2013  . Morbid obesity ([place]) 08/04/2013  . Acute combined systolic and diastolic congestive heart failure ([place]) 08/02/2013  . Type II or unspecified type diabetes mellitus without mention of complication, uncontrolled 08/02/2013    Past Medical History:  Diagnosis Date  . Chronic back pain   . Hypothyroidism   . Pneumonia   . Pre-diabetes    takes metformin preventatively  . Ventral hernia     Past Surgical History:  Procedure Laterality Date  .  ABDOMINAL HYSTERECTOMY  11/2011  . CHOLECYSTECTOMY  1988  . COLONOSCOPY WITH PROPOFOL N/A 05/27/2016   Procedure: COLONOSCOPY WITH PROPOFOL;  Surgeon: Wilford Corner, MD;  Location: [facility];  Service: Endoscopy;  Laterality: N/A;  . DILATION AND CURETTAGE OF UTERUS     x2  . ESOPHAGOGASTRODUODENOSCOPY (EGD) WITH PROPOFOL N/A 05/27/2016   Procedure: ESOPHAGOGASTRODUODENOSCOPY (EGD) WITH PROPOFOL;  Surgeon: Wilford Corner, MD;  Location: [facility];  Service: Endoscopy;  Laterality: N/A;  . GASTRIC BYPASS    . GASTRIC ROUX-EN-Y N/A 12/13/2017   Procedure: LAPAROSCOPIC ASSTED VENTRAL HERNIA REPAIR, Upper Endo;  Surgeon: Excell Seltzer, MD;  Location: [facility];  Service: General;  Laterality: N/A;  With MESH  . LEFT HEART CATH AND CORONARY  ANGIOGRAPHY N/A 08/05/2017   Procedure: LEFT HEART CATH AND CORONARY ANGIOGRAPHY;  Surgeon: Martinique, Peter M, MD;  Location: [facility];  Service: Cardiovascular;  Laterality: N/A;  . revision gastric bypass     and ventrel hernia repair  Dr. Excell Seltzer 12-13-17  . SPLENECTOMY, TOTAL  1988    Social History   Tobacco Use  . Smoking status: Never Smoker  . Smokeless tobacco: Never Used  Substance Use Topics  . Alcohol use: No  . Drug use: No    No family history on file.  Allergies  Allergen Reactions  . Nsaids Swelling and Other (See Comments)    Severe swelling--water retention (including water on brain)  . Tolmetin Swelling and Other (See Comments)    Severe swelling-water retention (including water on brain)     Medication list has been reviewed and updated.  Current Outpatient Medications on File Prior to Visit  Medication Sig Dispense Refill  . Ascorbic Acid (VITAMIN C) 1000 MG tablet Take 1,000 mg by mouth daily.    . cyclobenzaprine (FLEXERIL) 10 MG tablet Take 10 mg by mouth 3 (three) times daily as needed for muscle spasms.    Marland Kitchen ELIQUIS STARTER PACK (ELIQUIS STARTER PACK) 5 MG TABS Take as directed on package: start with two-5mg  tablets twice daily for 7 days. On day 8, switch to one-5mg  tablet twice daily. 1 each 0  . metFORMIN (GLUCOPHAGE) 500 MG tablet Take 1 tablet (500 mg total) by mouth 2 (two) times daily with a meal. 180 tablet 3  . thyroid (NP THYROID) 60 MG tablet Take 60 mg by mouth daily before breakfast.    . traMADol (ULTRAM) 50 MG tablet Take 1 tablet (50 mg total) by mouth every 6 (six) hours as needed for moderate pain. 20 tablet 0  . ondansetron (ZOFRAN) 4 MG tablet Take 4 mg by mouth 3 (three) times daily as needed for nausea/vomiting.     No current facility-administered medications on file prior to visit.     Review of Systems:  As per HPI- otherwise negative. 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 Ideal Body Weight: Weight in (lb) to have BMI = 25: 145.3  GEN: WDWN, NAD, Non-toxic, A & O x 3, obese, wearing oxygen via [place] today HEENT: Atraumatic, Normocephalic. Neck supple. No masses, No LAD. Ears and Nose: No external deformity. CV: RRR, No M/G/R. No JVD. No thrill. No extra heart  sounds. PULM: CTA B, no wheezes, crackles, rhonchi. No retractions. No resp. distress. No accessory muscle use. ABD: S, NT, ND, +BS. No rebound. No HSM. EXTR: No c/c. She has 1+ edema of both LE which pt reports is better than this mroning  NEURO did not have her get up an walk today.  In [facility] PSYCH: Normally interactive. Conversant. Not depressed or anxious appearing.  Calm demeanor.    Assessment and Plan: Acute saddle pulmonary embolism with acute cor pulmonale (HCC) - Plan: Ambulatory referral to Pulmonology  Lower extremity edema - Plan: Comprehensive metabolic panel, CBC, B Nat Peptide, furosemide (LASIX) 20 MG tablet  Dyslipidemia - Plan: lovastatin (MEVACOR) 20 MG tablet  Chronic combined systolic and diastolic heart failure (HCC) - Plan: furosemide (LASIX) 20 MG tablet  Following up today from recent admission with bilateral massive PE and cor pulmonale She is on eliquis and also oxygen.  She is doing ok with her oxygen, is even driving (states that she was cleared to drive by her surgeon following her hernia operation earlier in the month). Will continue current treatment and refer to pulmonology to help [place] follow-up She is currently most concerned about LE edema.  Suspect due to lack decreased physical activity, obesity and venous insuf.  May also be an element of CHF She is using lasix that she had on hand- we think she took 40 mg today- and this did help Will check a CMP, BNP-  assuming her lytes and kidneys are ok may have her increase her lasix use as needed Await her labs and will give her a call Message to her cardiologist requesting that he schedule a visit Daily weights   Signed Lamar Blinks, MD  Received her labs 1/29- called by no answer.  LMOM  Called her again 1/30 She reports that her leg swelling is better.  She took the lasix 40 qd for 2 consecutive days, but did not use today  Overall she is feeling better.  discussed elevated BNP- however with recent normal echo this may be a red herring.  She will see me in a week for a recheck visit- scheduled for 2/7  Results for orders placed or performed in visit on 12/26/17  Comprehensive metabolic panel  Result Value Ref Range   Sodium 140 135 - 145 mEq/L   Potassium 4.1 3.5 - 5.1 mEq/L   Chloride 98 96 - 112 mEq/L   CO2 35 (H) 19 - 32 mEq/L   Glucose, Bld 96 70 - 99 mg/dL   BUN 7 6 - 23 mg/dL   Creatinine, Ser 0.79 0.40 - 1.20 mg/dL   Total Bilirubin 0.7 0.2 - 1.2 mg/dL   Alkaline Phosphatase 88 39 - 117 U/L   AST 11 0 - 37 U/L   ALT 16 0 - 35 U/L   Total Protein 6.9 6.0 - 8.3 g/dL   Albumin 3.5 3.5 - 5.2 g/dL   Calcium 8.9 8.4 - 10.5 mg/dL   GFR 76.59 >60.00 mL/min  CBC  Result Value Ref Range   WBC 10.9 (H) 4.0 - 10.5 K/uL   RBC 4.36 3.87 - 5.11 Mil/uL   Platelets 550.0 (H) 150.0 - 400.0 K/uL   Hemoglobin 11.2 (L) 12.0 - 15.0 g/dL   HCT 35.8 (L) 36.0 - 46.0 %   MCV 82.1 78.0 - 100.0 fl   MCHC 31.2 30.0 - 36.0 g/dL   RDW 14.7 11.5 - 15.5 %  B Nat Peptide  Result Value Ref Range   Pro  B Natriuretic peptide (BNP) 493.0 (H) 0.0 - 100.0 pg/mL

## 2017-12-26 NOTE — Telephone Encounter (Signed)
Copied from [place] [phone]. Topic: General - Other >> Dec 26, 2017 10:16 AM Synthia Innocent wrote: Reason for CCQ:[id number] refused social work

## 2017-12-27 ENCOUNTER — Ambulatory Visit: Payer: Medicare HMO

## 2017-12-27 LAB — COMPREHENSIVE METABOLIC PANEL
ALK PHOS: 88 U/L (ref 39–117)
ALT: 16 U/L (ref 0–35)
AST: 11 U/L (ref 0–37)
Albumin: 3.5 g/dL (ref 3.5–5.2)
BILIRUBIN TOTAL: 0.7 mg/dL (ref 0.2–1.2)
BUN: 7 mg/dL (ref 6–23)
CO2: 35 mEq/L — ABNORMAL HIGH (ref 19–32)
CREATININE: 0.79 mg/dL (ref 0.40–1.20)
Calcium: 8.9 mg/dL (ref 8.4–10.5)
Chloride: 98 mEq/L (ref 96–112)
GFR: 76.59 mL/min (ref >60.00)
GLUCOSE: 96 mg/dL (ref 70–99)
Potassium: 4.1 mEq/L (ref 3.5–5.1)
Sodium: 140 mEq/L (ref 135–145)
TOTAL PROTEIN: 6.9 g/dL (ref 6.0–8.3)

## 2017-12-27 LAB — CBC
HCT: 35.8 % — ABNORMAL LOW (ref 36.0–46.0)
Hemoglobin: 11.2 g/dL — ABNORMAL LOW (ref 12.0–15.0)
MCHC: 31.2 g/dL (ref 30.0–36.0)
MCV: 82.1 fl (ref 78.0–100.0)
PLATELETS: 550 10*3/uL — AB (ref 150.0–400.0)
RBC: 4.36 Mil/uL (ref 3.87–5.11)
RDW: 14.7 % (ref 11.5–15.5)
WBC: 10.9 10*3/uL — ABNORMAL HIGH (ref 4.0–10.5)

## 2017-12-27 LAB — BRAIN NATRIURETIC PEPTIDE: PRO B NATRI PEPTIDE: 493 pg/mL — AB (ref 0.0–100.0)

## 2017-12-28 DIAGNOSIS — I5043 Acute on chronic combined systolic (congestive) and diastolic (congestive) heart failure: Secondary | ICD-10-CM | POA: Diagnosis not present

## 2017-12-28 DIAGNOSIS — I130 Hypertensive heart and chronic kidney disease with heart failure and stage 1 through stage 4 chronic kidney disease, or unspecified chronic kidney disease: Secondary | ICD-10-CM | POA: Diagnosis not present

## 2017-12-28 DIAGNOSIS — M48061 Spinal stenosis, lumbar region without neurogenic claudication: Secondary | ICD-10-CM | POA: Diagnosis not present

## 2017-12-28 DIAGNOSIS — I2699 Other pulmonary embolism without acute cor pulmonale: Secondary | ICD-10-CM | POA: Diagnosis not present

## 2017-12-28 DIAGNOSIS — E1122 Type 2 diabetes mellitus with diabetic chronic kidney disease: Secondary | ICD-10-CM | POA: Diagnosis not present

## 2017-12-30 ENCOUNTER — Telehealth: Payer: Self-pay | Admitting: *Deleted

## 2017-12-30 NOTE — Telephone Encounter (Signed)
Received Physician Orders from [facility]; forwarded to provider/SLS 02/01

## 2017-12-31 NOTE — Progress Notes (Deleted)
[place] at [facility] [address], [place], [place] [address] [phone]  Date:  01/04/2018   Name:  Kathleen Miller   DOB:  October 12, 1946   MRN:  [id number]  PCP:  Darreld Mclean, MD    Chief Complaint: No chief complaint on file.   History of Present Illness:  Kathleen Miller is a 72 y.o. very pleasant female patient who presents with the following:  Seen on 1/28 for a hospital follow-up  Patient Active Problem List   Diagnosis Date Noted  . Pressure injury of skin 12/20/2017  . Pulmonary embolism ([place]) 12/17/2017  . Leukocytosis 12/17/2017  . Hyponatremia 12/17/2017  . ARF (acute renal failure) ([place]) 12/17/2017  . Incisional hernia 12/13/2017  . Chest pain 08/05/2017  . Abnormal nuclear stress test 08/05/2017  . Low bone mass 02/23/2017  . Chronic pain syndrome 07/18/2016  . Dyslipidemia 07/12/2016  . LUQ pain 05/27/2016  . GERD (gastroesophageal reflux disease) 05/27/2016  . Encounter for screening mammogram for breast cancer 11/26/2014  . Slow transit constipation 06/06/2014  . Diabetes ([place]) 12/04/2013  . Chronic combined systolic and diastolic heart failure ([place]) 12/04/2013  . Essential hypertension, benign 12/04/2013  . Spinal stenosis of lumbar region 12/04/2013  . Morbid obesity ([place]) 08/04/2013  . Acute combined systolic and diastolic congestive heart failure ([place]) 08/02/2013  . Type II or unspecified type diabetes mellitus without mention of complication, uncontrolled 08/02/2013    Past Medical History:  Diagnosis Date  . Chronic back pain   . Hypothyroidism   . Pneumonia   . Pre-diabetes    takes metformin preventatively  . Ventral hernia     Past Surgical History:  Procedure Laterality Date  . ABDOMINAL HYSTERECTOMY  11/2011  . CHOLECYSTECTOMY  1988  . COLONOSCOPY WITH PROPOFOL N/A 05/27/2016   Procedure: COLONOSCOPY WITH PROPOFOL;  Surgeon: Wilford Corner, MD;  [facility]: [facility];   Service: Endoscopy;  Laterality: N/A;  . DILATION AND CURETTAGE OF UTERUS     x2  . ESOPHAGOGASTRODUODENOSCOPY (EGD) WITH PROPOFOL N/A 05/27/2016   Procedure: ESOPHAGOGASTRODUODENOSCOPY (EGD) WITH PROPOFOL;  Surgeon: Wilford Corner, MD;  [facility];  Service: Endoscopy;  Laterality: N/A;  . GASTRIC BYPASS    . GASTRIC ROUX-EN-Y N/A 12/13/2017   Procedure: LAPAROSCOPIC ASSTED VENTRAL HERNIA REPAIR, Upper Endo;  Surgeon: Excell Seltzer, MD;  Location: [facility];  Service: General;  Laterality: N/A;  With MESH  . LEFT HEART CATH AND CORONARY ANGIOGRAPHY N/A 08/05/2017   Procedure: LEFT HEART CATH AND CORONARY ANGIOGRAPHY;  Surgeon: Martinique, Peter M, MD;  Location: [place];  Service: Cardiovascular;  Laterality: N/A;  . revision gastric bypass     and ventrel hernia repair  Dr. Excell Seltzer 12-13-17  . SPLENECTOMY, TOTAL  1988    Social History   Tobacco Use  . Smoking status: Never Smoker  . Smokeless tobacco: Never Used  Substance Use Topics  . Alcohol use: No  . Drug use: No    No family history on file.  Allergies  Allergen Reactions  . Nsaids Swelling and Other (See Comments)    Severe swelling--water retention (including water on brain)  . Tolmetin Swelling and Other (See Comments)    Severe swelling-water retention (including water on brain)     Medication list has been reviewed and updated.  Current Outpatient Medications on File Prior to Visit  Medication Sig Dispense Refill  . Ascorbic Acid (VITAMIN C) 1000 MG  tablet Take 1,000 mg by mouth daily.    . cyclobenzaprine (FLEXERIL) 10 MG tablet Take 10 mg by mouth 3 (three) times daily as needed for muscle spasms.    Marland Kitchen ELIQUIS STARTER PACK (ELIQUIS STARTER PACK) 5 MG TABS Take as directed on package: start with two-5mg  tablets twice daily for 7 days. On day 8, switch to one-5mg  tablet twice daily. 1 each 0  . furosemide (LASIX) 20 MG tablet Take 2 tablets (40 mg total) by mouth daily. Adjust dose as  recommended by MD 90 tablet 2  . lovastatin (MEVACOR) 20 MG tablet Take 1 tablet (20 mg total) by mouth at bedtime. 90 tablet 3  . metFORMIN (GLUCOPHAGE) 500 MG tablet Take 1 tablet (500 mg total) by mouth 2 (two) times daily with a meal. 180 tablet 3  . ondansetron (ZOFRAN) 4 MG tablet Take 4 mg by mouth 3 (three) times daily as needed for nausea/vomiting.    . thyroid (NP THYROID) 60 MG tablet Take 60 mg by mouth daily before breakfast.    . traMADol (ULTRAM) 50 MG tablet Take 1 tablet (50 mg total) by mouth every 6 (six) hours as needed for moderate pain. 20 tablet 0   No current facility-administered medications on file prior to visit.     Review of Systems:  As per HPI- otherwise negative.   Physical Examination: There were no vitals filed for this visit. There were no vitals filed for this visit. There is no height or weight on file to calculate BMI. Ideal Body Weight:    GEN: WDWN, NAD, Non-toxic, A & O x 3 HEENT: Atraumatic, Normocephalic. Neck supple. No masses, No LAD. Ears and Nose: No external deformity. CV: RRR, No M/G/R. No JVD. No thrill. No extra heart sounds. PULM: CTA B, no wheezes, crackles, rhonchi. No retractions. No resp. distress. No accessory muscle use. ABD: S, NT, ND, +BS. No rebound. No HSM. EXTR: No c/c/e NEURO Normal gait.  PSYCH: Normally interactive. Conversant. Not depressed or anxious appearing.  Calm demeanor.    Assessment and Plan: ***  Signed Lamar Blinks, MD

## 2017-12-31 NOTE — Progress Notes (Signed)
[place] at [facility] [address] [place], [place] [address] [phone]  Date:  01/02/2018   Name:  Kathleen Miller   DOB:  [birth year]   MRN:  [id number]  PCP:  Darreld Mclean, MD    Chief Complaint: Follow-up (Pt here for f/u visit.)   History of Present Illness:  Kathleen Miller is a 72 y.o. very pleasant female patient who presents with the following:  Following up from recent visit today Last seen here on 1/28 to follow-up from recent acute PE  She was admitted for ventral hernia repeat from 1/15 to 1/16.  Then ended up back in the ER with SOB- found to have bilateral PE Admit date:12/17/2017 Discharge date:12/21/2017 ---------------------------------------------------------------------------- Following up today from recent admission with bilateral massive PE and cor pulmonale She is on eliquis and also oxygen.  She is doing ok with her oxygen, is even driving (states that she was cleared to drive by her surgeon following her hernia operation earlier in the month). Will continue current treatment and refer to pulmonology to help [place] follow-up She is currently most concerned about LE edema.  Suspect due to lack decreased physical activity, obesity and venous insuf.  May also be an element of CHF She is using lasix that she had on hand- we think she took 40 mg today- and this did help Will check a CMP, BNP- assuming her lytes and kidneys are ok may have her increase her lasix use as needed Await her labs and will give her a call Message to her cardiologist requesting that he schedule a visit Daily weights Called her again 1/30 She reports that her leg swelling is better.  She took the lasix 40 qd for 2 consecutive days, but did not use today  Overall she is feeling better.  discussed elevated BNP- however with recent normal echo this may be a red herring.  She will see me in a week for a recheck visit- scheduled for  2/7  She has an appt with "the oxygen people" this coming Thursday She is still using her oxygen at we think 3L while she is at home- however she is not using her oxygen right now, she left it at home She is feeling tired out today Her breathing is overall getting better   She notes that some of her laproscopic port incisions are itchy and look a bit red- she hopes they are not getting infected.  They are not tender or hot, no fever  She did get a sacral decub when she was inpt - she needs me to look at this today.  She thinks it is getting better but she is not able to really look at it   She did not take her diuretic today so as to avoid urinary urgency while away from home  She is checking her daily weight at home - just got her scale a few days ago, brings in a few readings for me today  2/2  262.8 2/3 259.8 2/4 259.2 Patient Active Problem List   Diagnosis Date Noted  . Pressure injury of skin 12/20/2017  . Pulmonary embolism ([place]) 12/17/2017  . Leukocytosis 12/17/2017  . Hyponatremia 12/17/2017  . ARF (acute renal failure) ([place]) 12/17/2017  . Incisional hernia 12/13/2017  . Chest pain 08/05/2017  . Abnormal nuclear stress test 08/05/2017  . Low bone mass 02/23/2017  . Chronic pain syndrome 07/18/2016  . Dyslipidemia 07/12/2016  .  LUQ pain 05/27/2016  . GERD (gastroesophageal reflux disease) 05/27/2016  . Encounter for screening mammogram for breast cancer 11/26/2014  . Slow transit constipation 06/06/2014  . Diabetes ([place]) 12/04/2013  . Chronic combined systolic and diastolic heart failure ([place]) 12/04/2013  . Essential hypertension, benign 12/04/2013  . Spinal stenosis of lumbar region 12/04/2013  . Morbid obesity ([place]) 08/04/2013  . Acute combined systolic and diastolic congestive heart failure ([place]) 08/02/2013  . Type II or unspecified type diabetes mellitus without mention of complication, uncontrolled 08/02/2013    Past Medical History:  Diagnosis Date  .  Chronic back pain   . Hypothyroidism   . Pneumonia   . Pre-diabetes    takes metformin preventatively  . Ventral hernia     Past Surgical History:  Procedure Laterality Date  . ABDOMINAL HYSTERECTOMY  11/2011  . CHOLECYSTECTOMY  1988  . COLONOSCOPY WITH PROPOFOL N/A 05/27/2016   Procedure: COLONOSCOPY WITH PROPOFOL;  Surgeon: Wilford Corner, MD;  Location: [facility];  Service: Endoscopy;  Laterality: N/A;  . DILATION AND CURETTAGE OF UTERUS     x2  . ESOPHAGOGASTRODUODENOSCOPY (EGD) WITH PROPOFOL N/A 05/27/2016   Procedure: ESOPHAGOGASTRODUODENOSCOPY (EGD) WITH PROPOFOL;  Surgeon: Wilford Corner, MD;  Location: [facility];  Service: Endoscopy;  Laterality: N/A;  . GASTRIC BYPASS    . GASTRIC ROUX-EN-Y N/A 12/13/2017   Procedure: LAPAROSCOPIC ASSTED VENTRAL HERNIA REPAIR, Upper Endo;  Surgeon: Excell Seltzer, MD;  Location: [facility];  Service: General;  Laterality: N/A;  With MESH  . LEFT HEART CATH AND CORONARY ANGIOGRAPHY N/A 08/05/2017   Procedure: LEFT HEART CATH AND CORONARY ANGIOGRAPHY;  Surgeon: Martinique, Peter M, MD;  Location: [facility];  Service: Cardiovascular;  Laterality: N/A;  . revision gastric bypass     and ventrel hernia repair  Dr. Excell Seltzer 12-13-17  . SPLENECTOMY, TOTAL  1988    Social History   Tobacco Use  . Smoking status: Never Smoker  . Smokeless tobacco: Never Used  Substance Use Topics  . Alcohol use: No  . Drug use: No    No family history on file.  Allergies  Allergen Reactions  . Nsaids Swelling and Other (See Comments)    Severe swelling--water retention (including water on brain)  . Tolmetin Swelling and Other (See Comments)    Severe swelling-water retention (including water on brain)     Medication list has been reviewed and updated.  Current Outpatient Medications on File Prior to Visit  Medication Sig Dispense Refill  . Ascorbic Acid (VITAMIN C) 1000 MG tablet Take 1,000 mg by mouth daily.    . cyclobenzaprine  (FLEXERIL) 10 MG tablet Take 10 mg by mouth 3 (three) times daily as needed for muscle spasms.    Marland Kitchen ELIQUIS STARTER PACK (ELIQUIS STARTER PACK) 5 MG TABS Take as directed on package: start with two-5mg  tablets twice daily for 7 days. On day 8, switch to one-5mg  tablet twice daily. 1 each 0  . furosemide (LASIX) 20 MG tablet Take 2 tablets (40 mg total) by mouth daily. Adjust dose as recommended by MD 90 tablet 2  . lovastatin (MEVACOR) 20 MG tablet Take 1 tablet (20 mg total) by mouth at bedtime. 90 tablet 3  . metFORMIN (GLUCOPHAGE) 500 MG tablet Take 1 tablet (500 mg total) by mouth 2 (two) times daily with a meal. 180 tablet 3  . ondansetron (ZOFRAN) 4 MG tablet Take 4 mg by mouth 3 (three) times daily as needed for nausea/vomiting.    . thyroid (  NP THYROID) 60 MG tablet Take 60 mg by mouth daily before breakfast.    . traMADol (ULTRAM) 50 MG tablet Take 1 tablet (50 mg total) by mouth every 6 (six) hours as needed for moderate pain. 20 tablet 0   No current facility-administered medications on file prior to visit.     Review of Systems:  As per HPI- otherwise negative. No fever or chills   Physical Examination: Vitals:   01/02/18 1014  BP: 110/72  Pulse: 76  Temp: 98.1 F (36.7 C)  SpO2: 98%   Vitals:   01/02/18 1014  Weight: 259 lb 3.2 oz (117.6 kg)  Height: 5\' 4"  (1.626 m)   Body mass index is 44.49 kg/m. Ideal Body Weight: Weight in (lb) to have BMI = 25: 145.3  GEN: WDWN, NAD, Non-toxic, A & O x 3, obese, looks well HEENT: Atraumatic, Normocephalic. Neck supple. No masses, No LAD.  Bilateral TM wnl, oropharynx normal.  PEERL,EOMI.   Ears and Nose: No external deformity. CV: RRR, No M/G/R. No JVD. No thrill. No extra heart sounds. PULM: CTA B, no wheezes, crackles, rhonchi. No retractions. No resp. distress. No accessory muscle use. ABD: S, NT, ND, +BS. No rebound. No HSM. EXTR: No c/c.  She has minimal chronic edema of her bilateral LE  NEURO Normal gait.  PSYCH:  Normally interactive. Conversant. Not depressed or anxious appearing.  Calm demeanor.  She has a few skin lesion on her superior gluteal cleft.   Per pt she has fewer of these lesions than she had right after her admission We applied a gel dressing to the area today Port sites on her abdomen are inflamed but do not appear infected   Assessment and Plan: Acute saddle pulmonary embolism with acute cor pulmonale (HCC)  Pressure injury of sacral region, stage 1  Lower extremity edema  Her hypoxia is resolved at rest today- did not test her with walking however She has an appt with pulmonology next week She will see me in about one week to check on her decub ulcer Overall she looks much better than at our last visit  She will try decreasing her oxygen to 2L  Signed Lamar Blinks, MD

## 2018-01-02 ENCOUNTER — Ambulatory Visit (INDEPENDENT_AMBULATORY_CARE_PROVIDER_SITE_OTHER): Payer: Medicare HMO | Admitting: Family Medicine

## 2018-01-02 VITALS — BP 110/72 | HR 76 | Temp 98.1°F | Ht 64.0 in | Wt 259.2 lb

## 2018-01-02 DIAGNOSIS — R600 Localized edema: Secondary | ICD-10-CM | POA: Diagnosis not present

## 2018-01-02 DIAGNOSIS — M48061 Spinal stenosis, lumbar region without neurogenic claudication: Secondary | ICD-10-CM | POA: Diagnosis not present

## 2018-01-02 DIAGNOSIS — I130 Hypertensive heart and chronic kidney disease with heart failure and stage 1 through stage 4 chronic kidney disease, or unspecified chronic kidney disease: Secondary | ICD-10-CM | POA: Diagnosis not present

## 2018-01-02 DIAGNOSIS — L89151 Pressure ulcer of sacral region, stage 1: Secondary | ICD-10-CM | POA: Diagnosis not present

## 2018-01-02 DIAGNOSIS — I5043 Acute on chronic combined systolic (congestive) and diastolic (congestive) heart failure: Secondary | ICD-10-CM | POA: Diagnosis not present

## 2018-01-02 DIAGNOSIS — I2602 Saddle embolus of pulmonary artery with acute cor pulmonale: Secondary | ICD-10-CM | POA: Diagnosis not present

## 2018-01-02 DIAGNOSIS — E1122 Type 2 diabetes mellitus with diabetic chronic kidney disease: Secondary | ICD-10-CM | POA: Diagnosis not present

## 2018-01-02 DIAGNOSIS — I2699 Other pulmonary embolism without acute cor pulmonale: Secondary | ICD-10-CM | POA: Diagnosis not present

## 2018-01-02 NOTE — Patient Instructions (Addendum)
Go ahead and change your oxygen to 2L at home- you are doing ok on room air here today If you feel short of breath with activity you can go to 3L  Please reschedule your visit to see me from later this week to 1-2 weeks from now  Try some cortisone cream on the spots on your belly If you see any worsening- heat, pain, etc please let me know  You are seeing your surgeon this week and I suspect they will remove your staples   Please let me know if you have any concerns or any worsening in the meantime  Leave the gel dressing on your behind for a few days- then you can remove it.  If you continue to have any concerns about this area please let me know

## 2018-01-04 ENCOUNTER — Telehealth: Payer: Self-pay | Admitting: Family Medicine

## 2018-01-04 ENCOUNTER — Inpatient Hospital Stay: Payer: Medicare HMO | Admitting: Family Medicine

## 2018-01-04 NOTE — Telephone Encounter (Signed)
Copied from [place] [id number]. Topic: Quick Communication - See Telephone Encounter >> Jan 04, 2018  3:42 PM Aurelio Brash B wrote: CRM for notification. See Telephone encounter for:  PT wants to let Dr Lorelei Pont know that Dr Excell Seltzer told her she would need to have Dr Lorelei Pont start refilling her eliquis 5 mg  PT is not out now,  she just wants Dr Lorelei Pont to know in advance  01/04/18.

## 2018-01-04 NOTE — Telephone Encounter (Signed)
Copied from [place] [id number]. Topic: Quick Communication - See Telephone Encounter >> Jan 04, 2018  3:34 PM Aurelio Brash B wrote: CRM for notification. See Telephone encounter for:  PT states she bought a meter to check her 02 levels at home , the level  has been 96% - 98% . Dr Excell Seltzer told the pt to talk with Dr Lorelei Pont about discontinuing the 02. Fax [phone]   Hadley Pen  is the contact person.  01/04/18.

## 2018-01-05 ENCOUNTER — Ambulatory Visit: Payer: Medicare HMO | Admitting: Family Medicine

## 2018-01-05 DIAGNOSIS — M48061 Spinal stenosis, lumbar region without neurogenic claudication: Secondary | ICD-10-CM | POA: Diagnosis not present

## 2018-01-05 DIAGNOSIS — I2699 Other pulmonary embolism without acute cor pulmonale: Secondary | ICD-10-CM | POA: Diagnosis not present

## 2018-01-05 DIAGNOSIS — I5043 Acute on chronic combined systolic (congestive) and diastolic (congestive) heart failure: Secondary | ICD-10-CM | POA: Diagnosis not present

## 2018-01-05 DIAGNOSIS — E1122 Type 2 diabetes mellitus with diabetic chronic kidney disease: Secondary | ICD-10-CM | POA: Diagnosis not present

## 2018-01-05 DIAGNOSIS — I130 Hypertensive heart and chronic kidney disease with heart failure and stage 1 through stage 4 chronic kidney disease, or unspecified chronic kidney disease: Secondary | ICD-10-CM | POA: Diagnosis not present

## 2018-01-05 MED ORDER — APIXABAN 5 MG PO TABS: 5.0000 mg | ORAL_TABLET | Freq: Two times a day (BID) | ORAL | 6 refills | Status: DC

## 2018-01-05 NOTE — Telephone Encounter (Signed)
Called her back to discuss her oxygen   She has been off her oxygen most of the day today as she has been at the vet with her dog who is sick She has also been walking around and doing her exercises with no oxygen and is satting 97- 98% after being off oxygen for an extended period  Tonight she plans to try laying down without her oxygen and will see how she does.   She will let me know if she has any desaturation.  I also touched base with her Dr. Chase Caller who she is seeing next week about how we determine when oxygen is no longer needed

## 2018-01-09 NOTE — Telephone Encounter (Signed)
Summary: dc oxygen  O2 averaging 98% - pt would like O2 equipment picked up and we need to notify [facility] via RX to discontinue    Pt states that they told her they hadn't responded to request for portable oxygen because it wasn't non emergency and on the bottom of the pile. They've been rude on the phone and disrespectful of orders from Dr. Lorelei Pont. She is very displeased with [facility].

## 2018-01-09 NOTE — Telephone Encounter (Deleted)
Copied from [place]. Topic: General - Other >> Jan 09, 2018  1:12 PM Kathleen Miller wrote: O2 averaging 98% - pt would like O2 equipment picked up and we need to notify [facility] via RX to discontinue  Pt states that they told her they hadn't responded to request for portable oxygen because it wasn't non emergency and on the bottom of the pile. They've been rude on the phone and disrespectful of orders from Dr. Lorelei Pont. She is very displeased with [facility].

## 2018-01-09 NOTE — Telephone Encounter (Signed)
Called her- she has been off her O2 all weekend, sat not lower than 97% even after walking.   Will fax order to DC oxygen to [facility]

## 2018-01-10 DIAGNOSIS — E1122 Type 2 diabetes mellitus with diabetic chronic kidney disease: Secondary | ICD-10-CM | POA: Diagnosis not present

## 2018-01-10 DIAGNOSIS — M48061 Spinal stenosis, lumbar region without neurogenic claudication: Secondary | ICD-10-CM | POA: Diagnosis not present

## 2018-01-10 DIAGNOSIS — I2699 Other pulmonary embolism without acute cor pulmonale: Secondary | ICD-10-CM | POA: Diagnosis not present

## 2018-01-10 DIAGNOSIS — I5043 Acute on chronic combined systolic (congestive) and diastolic (congestive) heart failure: Secondary | ICD-10-CM | POA: Diagnosis not present

## 2018-01-10 DIAGNOSIS — I130 Hypertensive heart and chronic kidney disease with heart failure and stage 1 through stage 4 chronic kidney disease, or unspecified chronic kidney disease: Secondary | ICD-10-CM | POA: Diagnosis not present

## 2018-01-12 DIAGNOSIS — E1122 Type 2 diabetes mellitus with diabetic chronic kidney disease: Secondary | ICD-10-CM | POA: Diagnosis not present

## 2018-01-12 DIAGNOSIS — M48061 Spinal stenosis, lumbar region without neurogenic claudication: Secondary | ICD-10-CM | POA: Diagnosis not present

## 2018-01-12 DIAGNOSIS — I5043 Acute on chronic combined systolic (congestive) and diastolic (congestive) heart failure: Secondary | ICD-10-CM | POA: Diagnosis not present

## 2018-01-12 DIAGNOSIS — I2699 Other pulmonary embolism without acute cor pulmonale: Secondary | ICD-10-CM | POA: Diagnosis not present

## 2018-01-12 DIAGNOSIS — I130 Hypertensive heart and chronic kidney disease with heart failure and stage 1 through stage 4 chronic kidney disease, or unspecified chronic kidney disease: Secondary | ICD-10-CM | POA: Diagnosis not present

## 2018-01-13 ENCOUNTER — Encounter: Payer: Self-pay | Admitting: Internal Medicine

## 2018-01-13 ENCOUNTER — Ambulatory Visit: Payer: Medicare HMO | Admitting: Internal Medicine

## 2018-01-13 VITALS — BP 118/68 | HR 85 | Ht 64.0 in | Wt 250.8 lb

## 2018-01-13 DIAGNOSIS — Z86711 Personal history of pulmonary embolism: Secondary | ICD-10-CM | POA: Diagnosis not present

## 2018-01-13 NOTE — Patient Instructions (Addendum)
ICD-10-CM   1. History of pulmonary embolism Z86.711     Glad 1 month of eliquis has gone well and you are feeling better   Plan Walk test with your walker on forehead prbe in office 01/13/2018 Continue eliquis - be careful about falls and monitor for bleeding Do d=dimer blood work in 5 months from 01/13/2018 - can predict risk for future clot  Followup In 5 months do d-dimer blood work - 1 day before your office visit Return to see Dr Chase Caller in 5 months but a day after your d-dimer blood work

## 2018-01-13 NOTE — Progress Notes (Signed)
Subjective:    Patient ID: Kathleen Miller, female    DOB: July 04, 1946, 72 y.o.   MRN: [id number]  PCP Copland, Gay Filler, MD   HPI  IOV 01/13/2018  Chief Complaint  Patient presents with  . Advice Only    Referred by Dr. Silvestre Mesi.  Pt in hosp. 1/19-1/23 due to bilateral PEs.  CT angio done 12/17/17.  Pt states when she blows her nose, pt is having blood clots are coming out of rt nostril. Pt states she does have SOB but it is better than before hosp stay. Pt was on O2 when first released from hosp but is not currently.    72 year old obese female who uses a walker since an accident in 2012 that "pinched sciatic nerve".  She uses a walker when she goes to to unfamiliar territory but otherwise can manage with a cane.  She is independent and drives well.  She is a non-smoker.  She never had a history of cancer.  She is not on any hormone replacement pills.  She has never had pulmonary embolism in the past.  In mid January 2019 she underwent ventral hernia surgery.  In the postoperative.  She says that her SCD stockings were too short and they did not apply it properly and a portion of it pinched her right calf causing significant pain.  She was then discharged home.  She then got readmitted on December 17, 2017 with submassive pulmonary embolism with RV LV ratio of 1.47.  Echocardiogram at that admission showed moderate amount of RV dilatation.  Critical care medicine was consulted according to chart review.  She was not subject to thrombolysis.  She was placed on IV heparin and subsequently discharged on Eliquis.  She has a history of chronic kidney disease but her discharge creatinine and GFR are fine.  She personally opted for Eliquis despite the cost of $50 a month on the assumption that she will only have to take it for 6 months because of a provoked PE and also because of the benefits that come with the newer anticoagulation agent such as lower risk of bleeding and also fixed dosing and  no need to check INR.  At this point in time she says she has begun to feel better.  She came of oxygen after discharge.  This is based on subjective needs.  She is back doing all her functional activities.  She is fearful about adding new drugs.  She wants to come off the Eliquis at the end of 6 months which is 5 months from now.  There is no cough or dizziness.   Walking desaturation test on 01/13/2018 185 feet x 3 laps on ROOM AIR:  did not desaturate. Rest pulse ox was 99%, final pulse ox was 99%. HR response 72/min at rest to 100/min at peak exertion. Patient Kathleen Miller  Did not Desaturate < 88% . [place] did not  Desaturated </= 3% points. [place] yes get tachyardic. DID ONLY 2 laps deu to fatigue but not dyspnea    has a past medical history of Chronic back pain, Hypothyroidism, Pneumonia, Pre-diabetes, and Ventral hernia.   reports that  has never smoked. she has never used smokeless tobacco.  Past Surgical History:  Procedure Laterality Date  . ABDOMINAL HYSTERECTOMY  11/2011  . CHOLECYSTECTOMY  1988  . COLONOSCOPY WITH PROPOFOL N/A 05/27/2016   Procedure: COLONOSCOPY WITH PROPOFOL;  Surgeon: Wilford Corner, MD;  Location: [facility];  Service:  Endoscopy;  Laterality: N/A;  . DILATION AND CURETTAGE OF UTERUS     x2  . ESOPHAGOGASTRODUODENOSCOPY (EGD) WITH PROPOFOL N/A 05/27/2016   Procedure: ESOPHAGOGASTRODUODENOSCOPY (EGD) WITH PROPOFOL;  Surgeon: Wilford Corner, MD;  [facility]: [facility];  Service: Endoscopy;  Laterality: N/A;  . GASTRIC BYPASS    . GASTRIC ROUX-EN-Y N/A 12/13/2017   Procedure: LAPAROSCOPIC ASSTED VENTRAL HERNIA REPAIR, Upper Endo;  Surgeon: Excell Seltzer, MD;  [facility]: [facility];  Service: General;  Laterality: N/A;  With MESH  . LEFT HEART CATH AND CORONARY ANGIOGRAPHY N/A 08/05/2017   Procedure: LEFT HEART CATH AND CORONARY ANGIOGRAPHY;  Surgeon: Martinique, Peter M, MD;  Location: [facility];  Service: Cardiovascular;   Laterality: N/A;  . revision gastric bypass     and ventrel hernia repair  Dr. Excell Seltzer 12-13-17  . SPLENECTOMY, TOTAL  1988    Allergies  Allergen Reactions  . Nsaids Swelling and Other (See Comments)    Severe swelling--water retention (including water on brain)  . Tolmetin Swelling and Other (See Comments)    Severe swelling-water retention (including water on brain)     Immunization History  Administered Date(s) Administered  . Influenza Split 08/11/2016  . Influenza, High Dose Seasonal PF 12/14/2017  . Influenza,inj,Quad PF,6+ Mos 11/26/2014  . Pneumococcal Conjugate-13 01/26/2017  . Pneumococcal Polysaccharide-23 08/02/2013, 11/26/2014    No family history on file.   Current Outpatient Medications:  .  apixaban (ELIQUIS) 5 MG TABS tablet, Take 1 tablet (5 mg total) by mouth 2 (two) times daily., Disp: 60 tablet, Rfl: 6 .  Ascorbic Acid (VITAMIN C) 1000 MG tablet, Take 1,000 mg by mouth daily., Disp: , Rfl:  .  cyclobenzaprine (FLEXERIL) 10 MG tablet, Take 10 mg by mouth 3 (three) times daily as needed for muscle spasms., Disp: , Rfl:  .  furosemide (LASIX) 20 MG tablet, Take 2 tablets (40 mg total) by mouth daily. Adjust dose as recommended by MD, Disp: 90 tablet, Rfl: 2 .  lovastatin (MEVACOR) 20 MG tablet, Take 1 tablet (20 mg total) by mouth at bedtime., Disp: 90 tablet, Rfl: 3 .  metFORMIN (GLUCOPHAGE) 500 MG tablet, Take 1 tablet (500 mg total) by mouth 2 (two) times daily with a meal., Disp: 180 tablet, Rfl: 3 .  thyroid (NP THYROID) 60 MG tablet, Take 60 mg by mouth daily before breakfast., Disp: , Rfl:  .  traMADol (ULTRAM) 50 MG tablet, Take 1 tablet (50 mg total) by mouth every 6 (six) hours as needed for moderate pain., Disp: 20 tablet, Rfl: 0   Review of Systems  Constitutional: Positive for unexpected weight change. Negative for fever.  HENT: Positive for nosebleeds and postnasal drip. Negative for congestion, dental problem, ear pain, rhinorrhea, sinus  pressure, sneezing, sore throat and trouble swallowing.   Eyes: Negative for redness and itching.  Respiratory: Positive for shortness of breath. Negative for cough, chest tightness and wheezing.   Cardiovascular: Positive for leg swelling. Negative for palpitations.  Gastrointestinal: Positive for nausea. Negative for vomiting.  Genitourinary: Negative for dysuria.  Musculoskeletal: Positive for joint swelling.  Skin: Negative for rash.  Allergic/Immunologic: Negative.  Negative for environmental allergies, food allergies and immunocompromised state.  Neurological: Negative for headaches.  Hematological: Does not bruise/bleed easily.  Psychiatric/Behavioral: Negative for dysphoric mood. The patient is not nervous/anxious.        Objective:   Physical Exam Vitals:   01/13/18 0947  BP: 118/68  Pulse: 85  SpO2: 94%  Weight: 250 lb 12.8  oz (113.8 kg)  Height: 5\' 4"  (1.626 m)    Estimated body mass index is 43.05 kg/m as calculated from the following:   Height as of this encounter: 5\' 4"  (1.626 m).   Weight as of this encounter: 250 lb 12.8 oz (113.8 kg).   General Appearance:    Looksl OBESE - siting with walker  Head:    Normocephalic, without obvious abnormality, atraumatic  Eyes:    PERRL - yes, conjunctiva/corneas - clear      Ears:    Normal external ear canals, both ears  Nose:   NG tube - no  Throat:  ETT TUBE - no , OG tube - no  Neck:   Supple,  No enlargement/tenderness/nodules     Lungs:     Clear to auscultation bilaterally,   Chest wall:    No deformity  Heart:    S1 and S2 normal, no murmur, CVP - no.  Pressors - no  Abdomen:     Soft, no masses, no organomegaly  Genitalia:    Not done  Rectal:   not done  Extremities:   Extremities- intact. Has stocking     Skin:   Intact in exposed areas .      Neurologic:  . Moves all 4s - yes. CAM-ICU - neg . Orientation - x3+. Walks with walker. Calves chronic edema but no warmth or tenderness            Assessment & Plan:     ICD-10-CM   1. History of pulmonary embolism Z86.711        Glad 1 month of eliquis has gone well and you are feeling better   Plan Walk test with your walker on forehead prbe in office 01/13/2018 Continue eliquis - be careful about falls and monitor for bleeding Do d=dimer blood work in 5 months from 01/13/2018 - can predict risk for future clot  Followup In 5 months do d-dimer blood work - 1 day before your office visit Return to see Dr Chase Caller in 5 months but a day after your d-dimer blood work   (> 50% of this 15 min visit spent in face to face counseling or/and coordination of care)   Dr. Brand Males, M.D., [facility].C.P Pulmonary and Critical Care Medicine Staff Physician, [place] Director - Interstitial Lung Disease  Program  Pulmonary [place] at [place], [place], [address]  Pager: [phone], If no answer or between  15:00h - 7:00h: call [phone] Telephone: [phone]

## 2018-01-17 DIAGNOSIS — E1122 Type 2 diabetes mellitus with diabetic chronic kidney disease: Secondary | ICD-10-CM | POA: Diagnosis not present

## 2018-01-17 DIAGNOSIS — M48061 Spinal stenosis, lumbar region without neurogenic claudication: Secondary | ICD-10-CM | POA: Diagnosis not present

## 2018-01-17 DIAGNOSIS — I130 Hypertensive heart and chronic kidney disease with heart failure and stage 1 through stage 4 chronic kidney disease, or unspecified chronic kidney disease: Secondary | ICD-10-CM | POA: Diagnosis not present

## 2018-01-17 DIAGNOSIS — I2699 Other pulmonary embolism without acute cor pulmonale: Secondary | ICD-10-CM | POA: Diagnosis not present

## 2018-01-17 DIAGNOSIS — I5043 Acute on chronic combined systolic (congestive) and diastolic (congestive) heart failure: Secondary | ICD-10-CM | POA: Diagnosis not present

## 2018-01-18 NOTE — Progress Notes (Signed)
[place] at [facility] [address] [place], [place] [address] [phone]  Date:  01/19/2018   Name:  Kathleen Miller   DOB:  Sep 23, 1946   MRN:  [id number]  PCP:  Darreld Mclean, MD    Chief Complaint: Follow-up (Pt here for f/u visit. )   History of Present Illness:  Kathleen Miller is a 72 y.o. very pleasant female patient who presents with the following:  Following up today from recent PE I last saw her on 2/4:  She was admitted for ventral hernia repeat from 1/15 to 1/16. Then ended up back in the ER with SOB- found to have bilateral PE Admit date:12/17/2017 Discharge date:12/21/2017 She saw pulmonology on 2/15; given a 5 month follow-up recall- at that time they will do a D dimer to help determine how long to keep her on eliquis She is off oxygen- she had called me to report that she was doing ok off oxygen, so we stopped it She is doing "great without the oxygen. I'm so glad to be off of that"  She notes that she is continuing to get some blood clots from her right nostril only She may wipe her nose and find blood on her hand. She is never sure if she has a runny nose or bleeding The left side is no longer bleeding No blood in her urine  No easy bruising  She is on eliquis for her PE- prior to using this she had not noted any issues with blood from her nose  She also has noted much more frequent headaches- in the past she just rarely got a HA Over the last month she has noted a HA daily.   She is not aware of any daily pattern of her headaches.   She is not really sure if the HA is more in one part of her head or face.   If she she uses aleve the HA will go away She has noted some nausea in the mornings.  No vomiting  She does feel like her vision is not quite at her baseline over the last couple of weeks She does wear corrective lenses and has her eyes checked on a regular basis.   She does not see ENT  at this time She was told that "I have a deformed sinus" years ago  She notes that her nose is congested all the time recently, and she is using afrin at least once a day and has been doing so for a couple of weeks  She is using a cane at home for shorter distances or at home, and her walker for when she is out  BP Readings from Last 3 Encounters:  01/19/18 124/78  01/13/18 118/68  01/02/18 110/72   She will be on eliquis for another 5 months, and then they may be able to take her off this per cardiology Despite her concerns today she looks much better than at last visit and looks like her normal self  Lab Results  Component Value Date   HGBA1C 6.1 (H) 12/17/2017   She would like to RTW on March 4th- I will release her. She does need to call us back with the fax number for her job so I can write note  Patient Active Problem List   Diagnosis Date Noted  . Pressure injury of skin 12/20/2017  . Pulmonary embolism ([place]) 12/17/2017  . Leukocytosis 12/17/2017  . Hyponatremia  12/17/2017  . ARF (acute renal failure) ([place]) 12/17/2017  . Incisional hernia 12/13/2017  . Chest pain 08/05/2017  . Abnormal nuclear stress test 08/05/2017  . Low bone mass 02/23/2017  . Chronic pain syndrome 07/18/2016  . Dyslipidemia 07/12/2016  . LUQ pain 05/27/2016  . GERD (gastroesophageal reflux disease) 05/27/2016  . Encounter for screening mammogram for breast cancer 11/26/2014  . Slow transit constipation 06/06/2014  . Diabetes ([place]) 12/04/2013  . Chronic combined systolic and diastolic heart failure ([place]) 12/04/2013  . Essential hypertension, benign 12/04/2013  . Spinal stenosis of lumbar region 12/04/2013  . Morbid obesity ([place]) 08/04/2013  . Acute combined systolic and diastolic congestive heart failure ([place]) 08/02/2013  . Type II or unspecified type diabetes mellitus without mention of complication, uncontrolled 08/02/2013    Past Medical History:  Diagnosis Date  . Chronic back pain   .  Hypothyroidism   . Pneumonia   . Pre-diabetes    takes metformin preventatively  . Ventral hernia     Past Surgical History:  Procedure Laterality Date  . ABDOMINAL HYSTERECTOMY  11/2011  . CHOLECYSTECTOMY  1988  . COLONOSCOPY WITH PROPOFOL N/A 05/27/2016   Procedure: COLONOSCOPY WITH PROPOFOL;  Surgeon: Wilford Corner, MD;  Location: [facility];  Service: Endoscopy;  Laterality: N/A;  . DILATION AND CURETTAGE OF UTERUS     x2  . ESOPHAGOGASTRODUODENOSCOPY (EGD) WITH PROPOFOL N/A 05/27/2016   Procedure: ESOPHAGOGASTRODUODENOSCOPY (EGD) WITH PROPOFOL;  Surgeon: Wilford Corner, MD;  [facility];  Service: Endoscopy;  Laterality: N/A;  . GASTRIC BYPASS    . GASTRIC ROUX-EN-Y N/A 12/13/2017   Procedure: LAPAROSCOPIC ASSTED VENTRAL HERNIA REPAIR, Upper Endo;  Surgeon: Excell Seltzer, MD;  Location: [facility];  Service: General;  Laterality: N/A;  With MESH  . LEFT HEART CATH AND CORONARY ANGIOGRAPHY N/A 08/05/2017   Procedure: LEFT HEART CATH AND CORONARY ANGIOGRAPHY;  Surgeon: Martinique, Peter M, MD;  Location: [facility];  Service: Cardiovascular;  Laterality: N/A;  . revision gastric bypass     and ventrel hernia repair  Dr. Excell Seltzer 12-13-17  . SPLENECTOMY, TOTAL  1988    Social History   Tobacco Use  . Smoking status: Never Smoker  . Smokeless tobacco: Never Used  Substance Use Topics  . Alcohol use: No  . Drug use: No    History reviewed. No pertinent family history.  Allergies  Allergen Reactions  . Nsaids Swelling and Other (See Comments)    Severe swelling--water retention (including water on brain)  . Tolmetin Swelling and Other (See Comments)    Severe swelling-water retention (including water on brain)     Medication list has been reviewed and updated.  Current Outpatient Medications on File Prior to Visit  Medication Sig Dispense Refill  . apixaban (ELIQUIS) 5 MG TABS tablet Take 1 tablet (5 mg total) by mouth 2 (two) times daily. 60 tablet  6  . Ascorbic Acid (VITAMIN C) 1000 MG tablet Take 1,000 mg by mouth daily.    . cyclobenzaprine (FLEXERIL) 10 MG tablet Take 10 mg by mouth 3 (three) times daily as needed for muscle spasms.    . furosemide (LASIX) 20 MG tablet Take 2 tablets (40 mg total) by mouth daily. Adjust dose as recommended by MD 90 tablet 2  . lovastatin (MEVACOR) 20 MG tablet Take 1 tablet (20 mg total) by mouth at bedtime. 90 tablet 3  . metFORMIN (GLUCOPHAGE) 500 MG tablet Take 1 tablet (500 mg total) by mouth 2 (two) times daily  with a meal. 180 tablet 3  . thyroid (NP THYROID) 60 MG tablet Take 60 mg by mouth daily before breakfast.    . traMADol (ULTRAM) 50 MG tablet Take 1 tablet (50 mg total) by mouth every 6 (six) hours as needed for moderate pain. 20 tablet 0   No current facility-administered medications on file prior to visit.     Review of Systems:  As per HPI- otherwise negative. No CP or SOB No fever or chills  Physical Examination: Vitals:   01/19/18 0845  BP: 124/78  Pulse: 86  Temp: 98.2 F (36.8 C)  SpO2: 97%   Vitals:   01/19/18 0845  Weight: 251 lb 9.6 oz (114.1 kg)  Height: 5\' 4"  (1.626 m)   Body mass index is 43.19 kg/m. Ideal Body Weight: Weight in (lb) to have BMI = 25: 145.3  GEN: WDWN, NAD, Non-toxic, A & O x 3 HEENT: Atraumatic, Normocephalic. Neck supple. No masses, No LAD.  Bilateral TM wnl, oropharynx normal.  PEERL,EOMI.  Right nare does show evidence of recent bleeding but no active bleeding or clots noted No blood in posterior OP Ears and Nose: No external deformity. CV: RRR, No M/G/R. No JVD. No thrill. No extra heart sounds. PULM: CTA B, no wheezes, crackles, rhonchi. No retractions. No resp. distress. No accessory muscle use. ABD: S, NT, ND, +BS. No rebound. No HSM. EXTR: No c/c/e NEURO Normal gait for pt- uses her walker for balance PSYCH: Normally interactive. Conversant. Not depressed or anxious appearing.  Calm demeanor.  Obese, looks well  Assessment  and Plan: Acute saddle pulmonary embolism with acute cor pulmonale (HCC) - Plan: MR Brain W Wo Contrast  New daily persistent headache - Plan: MR Brain W Wo Contrast  Epistaxis - Plan: MR FACE TRIGEMINAL W WO CONTRAST  Other specified disorders of nose and nasal sinuses - Plan: MR FACE TRIGEMINAL W WO CONTRAST  Anticoagulant long-term use - Plan: MR Brain W Wo Contrast, MR FACE TRIGEMINAL W WO CONTRAST  Following up from recnet operation, then PE. She is off oxygen and feeling better.  However she now notes persistent HA and blood clots from left nostril Discussed her case with radiology who recommended MRI of her brain and face, with and wo Will work on getting these studies set up for pt.  She will seek care if any change or worsening of her sx.  As per pt instructions, was going to suggest afrin but she is already using it.  This may be causing some rebound congestion but also may help control bleeding.  Encouraged her to work on weaning off this medication  Signed Lamar Blinks, MD

## 2018-01-19 ENCOUNTER — Ambulatory Visit (INDEPENDENT_AMBULATORY_CARE_PROVIDER_SITE_OTHER): Payer: Medicare HMO | Admitting: Family Medicine

## 2018-01-19 ENCOUNTER — Encounter: Payer: Self-pay | Admitting: Family Medicine

## 2018-01-19 VITALS — BP 124/78 | HR 86 | Temp 98.2°F | Ht 64.0 in | Wt 251.6 lb

## 2018-01-19 DIAGNOSIS — I2699 Other pulmonary embolism without acute cor pulmonale: Secondary | ICD-10-CM | POA: Diagnosis not present

## 2018-01-19 DIAGNOSIS — I130 Hypertensive heart and chronic kidney disease with heart failure and stage 1 through stage 4 chronic kidney disease, or unspecified chronic kidney disease: Secondary | ICD-10-CM | POA: Diagnosis not present

## 2018-01-19 DIAGNOSIS — E1122 Type 2 diabetes mellitus with diabetic chronic kidney disease: Secondary | ICD-10-CM | POA: Diagnosis not present

## 2018-01-19 DIAGNOSIS — G4452 New daily persistent headache (NDPH): Secondary | ICD-10-CM

## 2018-01-19 DIAGNOSIS — I2602 Saddle embolus of pulmonary artery with acute cor pulmonale: Secondary | ICD-10-CM

## 2018-01-19 DIAGNOSIS — R040 Epistaxis: Secondary | ICD-10-CM | POA: Diagnosis not present

## 2018-01-19 DIAGNOSIS — Z7901 Long term (current) use of anticoagulants: Secondary | ICD-10-CM | POA: Diagnosis not present

## 2018-01-19 DIAGNOSIS — M48061 Spinal stenosis, lumbar region without neurogenic claudication: Secondary | ICD-10-CM | POA: Diagnosis not present

## 2018-01-19 DIAGNOSIS — I5043 Acute on chronic combined systolic (congestive) and diastolic (congestive) heart failure: Secondary | ICD-10-CM | POA: Diagnosis not present

## 2018-01-19 DIAGNOSIS — J3489 Other specified disorders of nose and nasal sinuses: Secondary | ICD-10-CM

## 2018-01-19 NOTE — Patient Instructions (Addendum)
I would recommend tylenol for aches and pains instead of NSAIDs- this will decrease your risk of having any more bleeding  I am going to set up an MRI for your head and for your sinuses; hopefully we will get this arranged in the next week or so.   If you are getting worse or have any change in your symptoms please seek care asap  You might try using some afrin nasal spray for a couple of days - this may help with your nose bleeds

## 2018-02-01 ENCOUNTER — Telehealth: Payer: Self-pay | Admitting: Family Medicine

## 2018-02-01 ENCOUNTER — Other Ambulatory Visit: Payer: Self-pay | Admitting: Family Medicine

## 2018-02-01 NOTE — Telephone Encounter (Signed)
Copied from [place]. Topic: Referral - Status >> Feb 01, 2018  1:33 PM Robina Ade, Helene Kelp D wrote: Reason for CRM: Ena Dawley with [facility] called and said that the Brain MRI has been authorized but the Base Face with CPT code [phone] has not been authorized and she ask for this to be done before patients appt 02/06/18. She can be reached at [phone] if there are any questions or concern.

## 2018-02-02 ENCOUNTER — Telehealth: Payer: Self-pay | Admitting: *Deleted

## 2018-02-02 NOTE — Telephone Encounter (Signed)
Copied from [place] [id number]. Topic: Inquiry >> Jan 26, 2018  4:08 PM Oliver Pila B wrote: Reason for CRM: pt called to give Dr. Lorelei Pont a number to fax fmla docs; put to the attn of Dulcy Fanny    Fax [phone] FMLA RETURN TO WORK ON MARCH 6th; contact pt when this has been done  >> Jan 30, 2018  9:08 AM Scherrie Gerlach wrote: Pt wants to know what day Dr Lorelei Pont plans on releasing the pt to return to work? Pt could not get an imaging appt until 3/11, and doesn't have any more sick leave. Pt states she can take 2 more weeks if ok with Dr Lorelei Pont.

## 2018-02-03 NOTE — Telephone Encounter (Signed)
Called Kathleen Miller at [facility] imaging- we need to get authorization for the MRI face/ trigeminal through [facility].  The MRI brain is all set The CPT for this study is [phone] She invited my staff to call her back if any questions Thank you Gwen!

## 2018-02-03 NOTE — Telephone Encounter (Signed)
Called her, [facility]

## 2018-02-06 ENCOUNTER — Other Ambulatory Visit: Payer: Self-pay | Admitting: Family Medicine

## 2018-02-06 ENCOUNTER — Other Ambulatory Visit: Payer: Medicare HMO

## 2018-02-06 ENCOUNTER — Inpatient Hospital Stay: Admission: RE | Admit: 2018-02-06 | Payer: Medicare HMO | Source: Ambulatory Visit

## 2018-02-06 NOTE — Telephone Encounter (Signed)
Called her and LMOM. Please let me know if she still needs anything from my end

## 2018-02-17 ENCOUNTER — Telehealth: Payer: Self-pay | Admitting: Family Medicine

## 2018-02-17 NOTE — Telephone Encounter (Signed)
Called her and left detailed message on her machine.  It looks like we can get the MRI of her head, but the face will not be covered unless we have ENT do endoscopy of her nose first.  I am sorry that this has taken so long to resolve.  Please call me back so we can discuss how she would like to proceed

## 2018-03-01 IMAGING — DX DG CHEST 1V PORT
1 series · 1 of 1 positions shown · non-contrast
Comparison: May 10, 2016

CLINICAL DATA: Chest pain and shortness of breath

EXAM:
PORTABLE CHEST 1 VIEW

[chest]
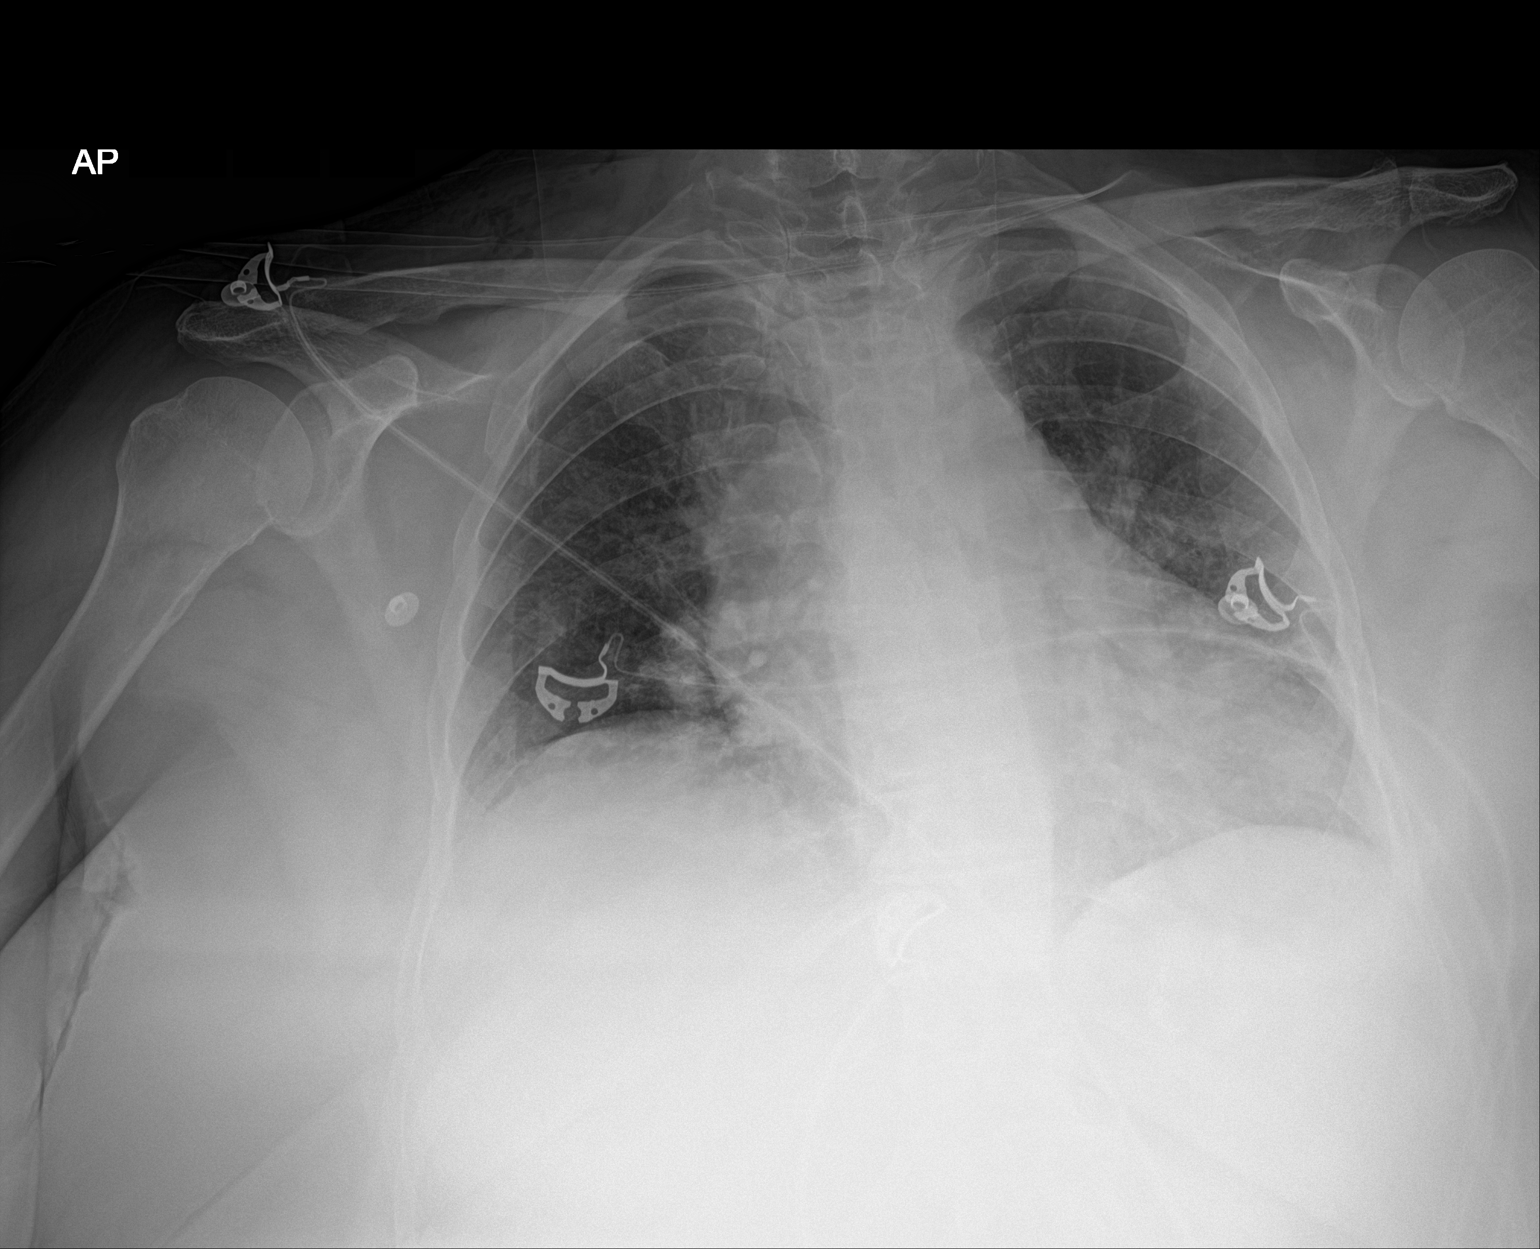

[1 of 1 positions shown; findings below may reference images not displayed]

FINDINGS: Cardiomegaly. The hila and mediastinum are unremarkable given the
low volume portable technique. No pulmonary nodules or masses. No
focal infiltrates. No overt edema. Continued elevation of the right
hemidiaphragm.
IMPRESSION: No active disease.

## 2018-03-01 IMAGING — CT CT ANGIO CHEST
2 of 6 series · 18 of 46 positions shown · IV contrast (APPLIED)
Comparison: Chest CT 12/17/2017

CLINICAL DATA: Recent hernia repair. Last night pt become weak and
short of breath. Shortness of breath has worsened today. ^60mL
UVO7P9-HBZ IOPAMIDOL (UVO7P9-HBZ) INJECTION 76%

EXAM:
CT ANGIOGRAPHY CHEST WITH CONTRAST
TECHNIQUE: Multidetector CT imaging of the chest was performed using the
standard protocol during bolus administration of intravenous
contrast. Multiplanar CT image reconstructions and MIPs were
obtained to evaluate the vascular anatomy.
CONTRAST:  60mL UVO7P9-HBZ IOPAMIDOL (UVO7P9-HBZ) INJECTION 76%

[Series 7: thins · axial · 0.69mm/px · z∈[-293,-64]mm · 15 of 359 slices shown]
[im 16/359  lung]
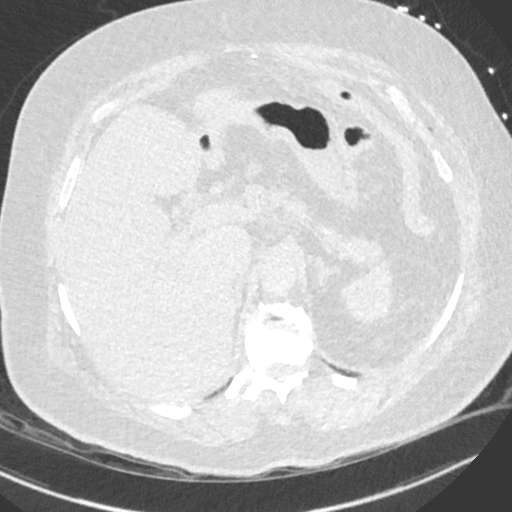
[im 47/359  soft-tissue]
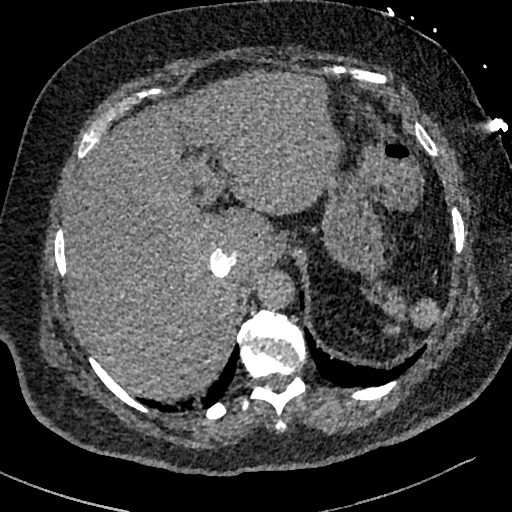
[im 63/359  lung]
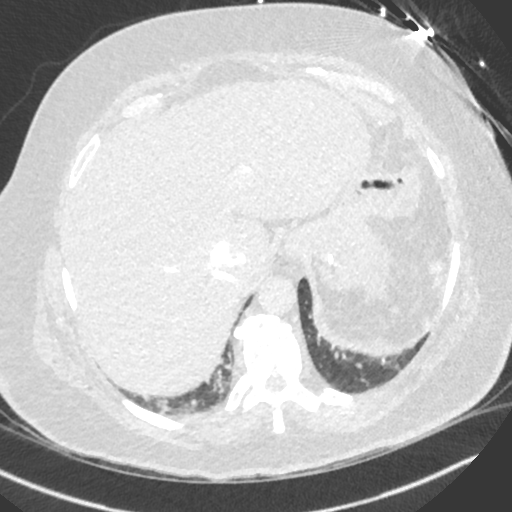
[im 94/359  soft-tissue]
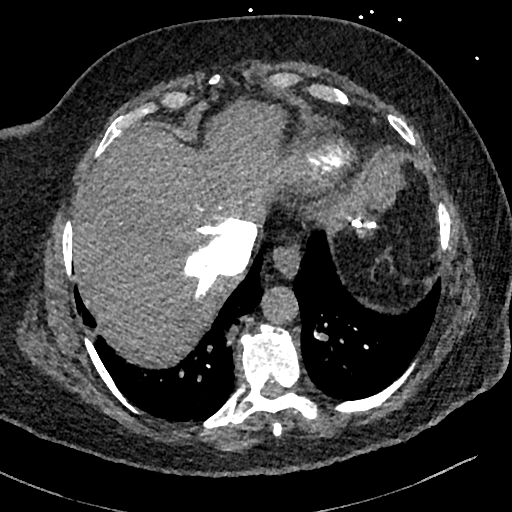
[im 109/359  lung]
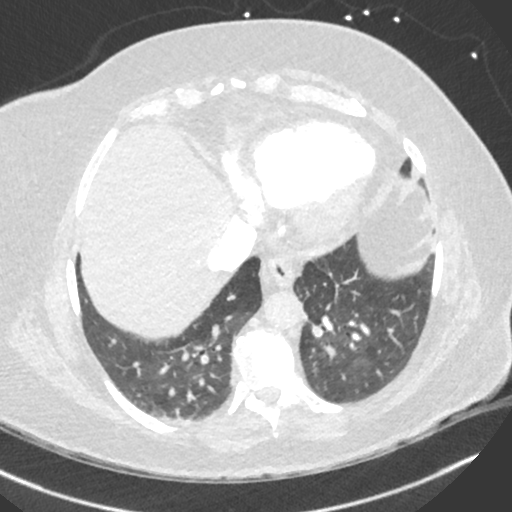
[im 141/359  soft-tissue]
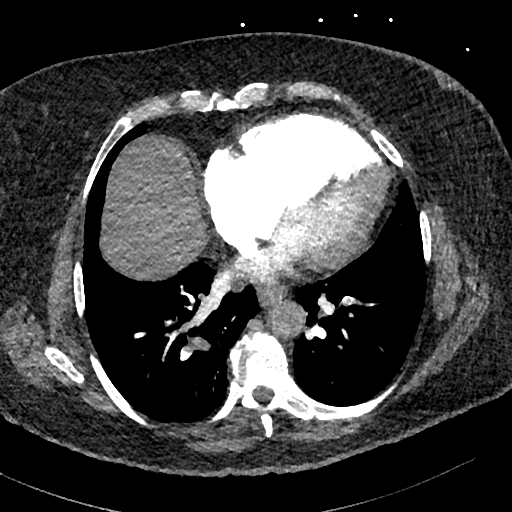
[im 156/359  lung]
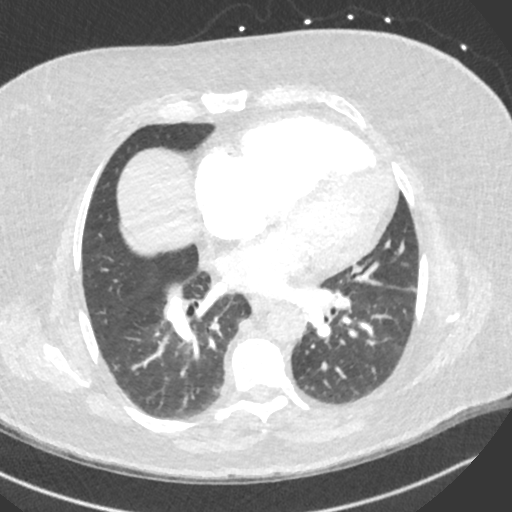
[im 187/359  soft-tissue]
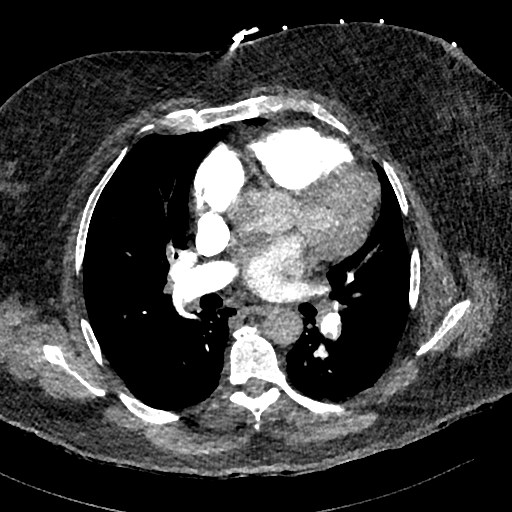
[im 203/359  lung]
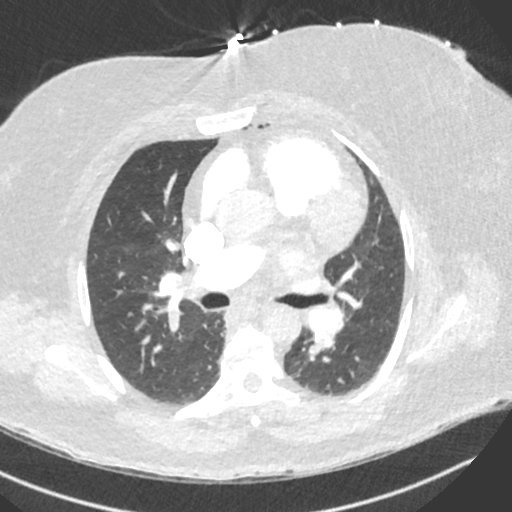
[im 218/359  soft-tissue]
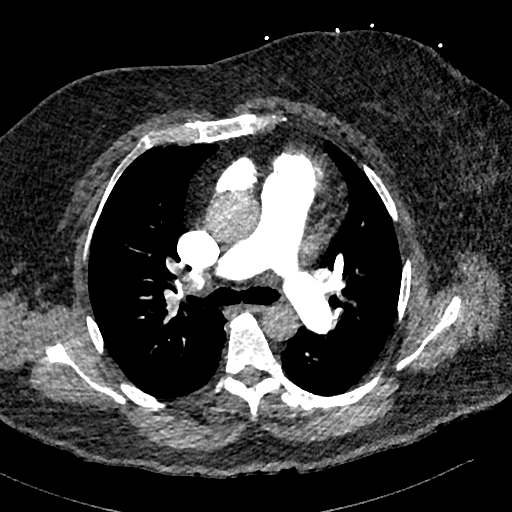
[im 250/359  lung]
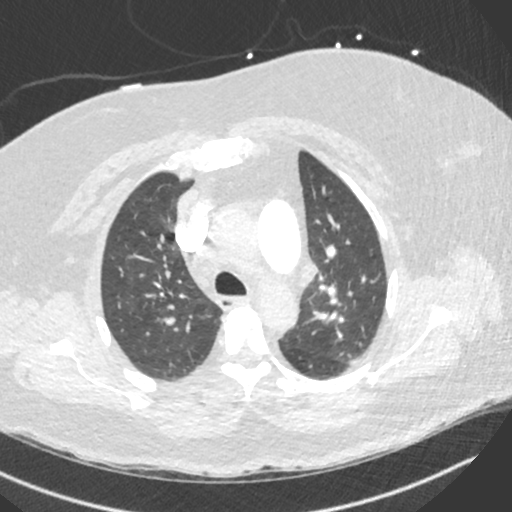
[im 265/359  soft-tissue]
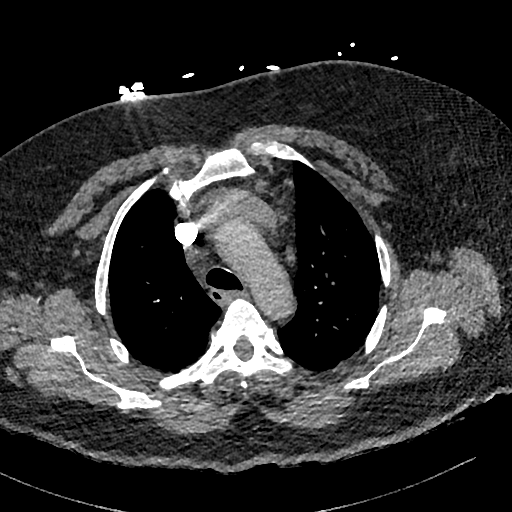
[im 296/359  lung]
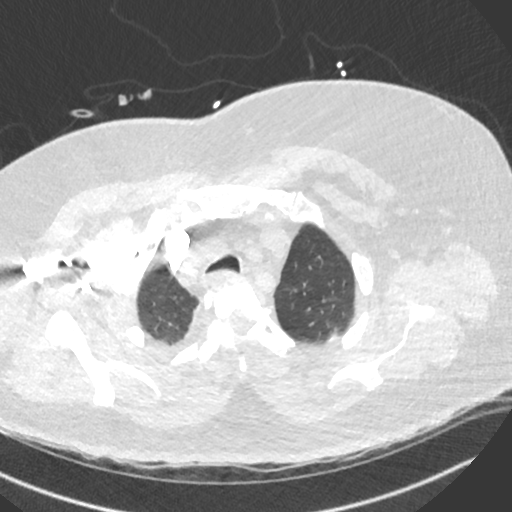
[im 312/359  soft-tissue]
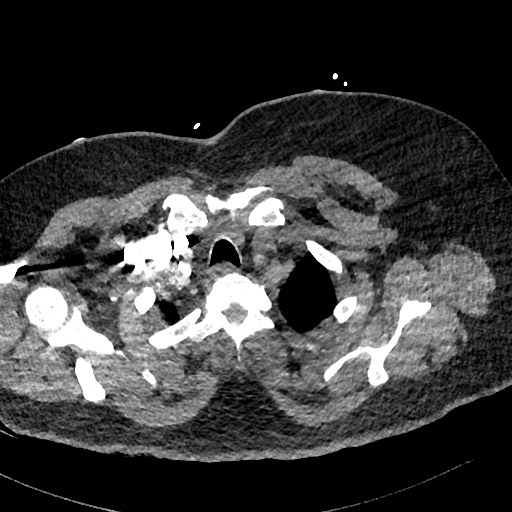
[im 343/359  lung]
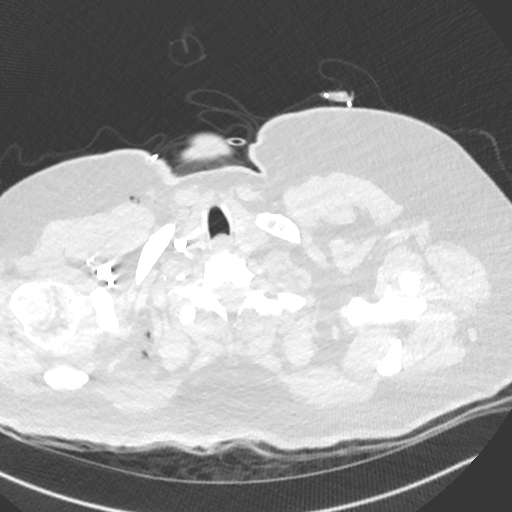

[Series 8: cor · coronal · 0.52mm/px · 3 of 125 slices shown]
[im 32/125  soft-tissue]
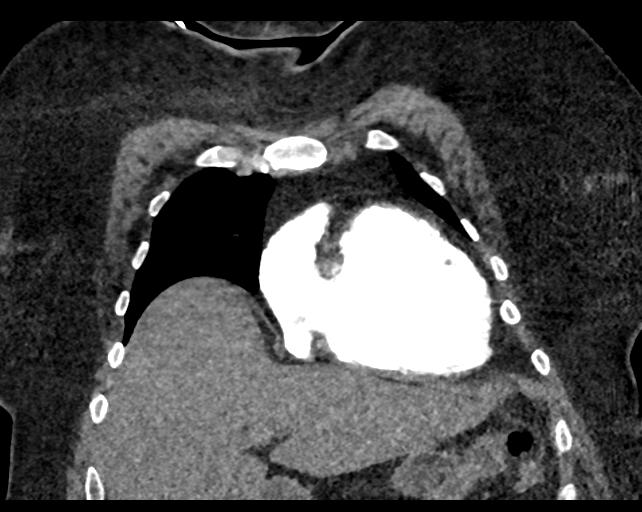
[im 63/125  soft-tissue]
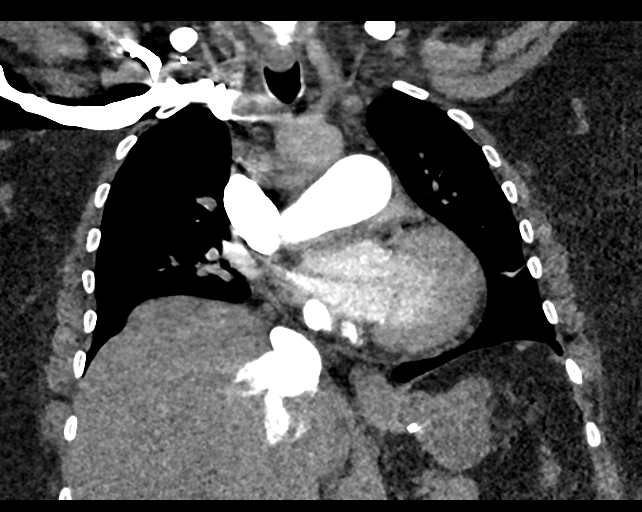
[im 94/125  soft-tissue]
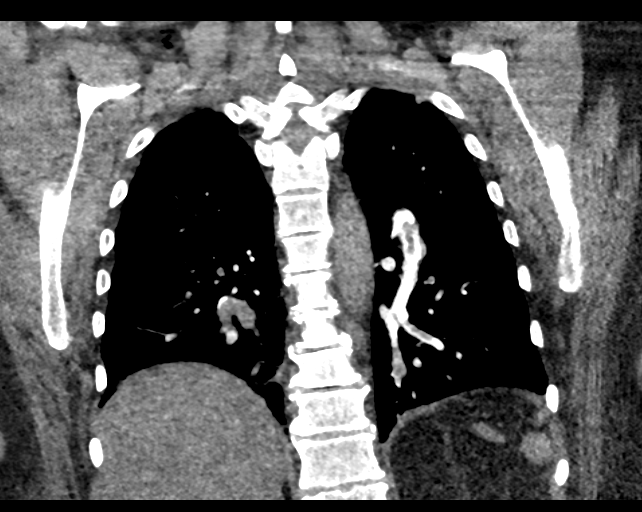

[18 of 46 positions shown; findings below may reference images not displayed]

FINDINGS: Cardiovascular: There is a filling defect within proximal RIGHT
upper lobe pulmonary artery which is occlusive (image 50, series 5).
Filling defect within the RIGHT middle lobe pulmonary artery which
is proximal. Segmental filling defects within the lower lobe
pulmonary arteries on the RIGHT.

Filling defects within the segmental pulmonary arteries in the LEFT
lower lobe which are occlusive. Lingular filling defect distally.
Probable upper lobe pulmonary filling defects.

Findings consistent with bilateral multiple pulmonary emboli.

The RIGHT ventricular to LEFT ventricular ratio is equal to

Mediastinum/Nodes: No axillary supraclavicular adenopathy. No
mediastinal hilar adenopathy. No pericardial effusion.

Lungs/Pleura: No pulmonary infarction identified. Mild atelectasis
the RIGHT lung base.

Upper Abdomen: Limited view of the liver, kidneys, pancreas are
unremarkable. Normal adrenal glands.

Musculoskeletal: No aggressive osseous lesion.

Review of the MIP images confirms the above findings.
IMPRESSION: 1. Bilateral occlusive pulmonary emboli involving the upper and
lower lobes. Emboli involve the proximal segmental pulmonary
arteries.
2. Positive for acute PE with CT evidence of right heart strain
(RV/LV Ratio = 1.47) consistent with at least submassive
(intermediate risk) PE. The presence of right heart strain has been
associated with an increased risk of morbidity and mortality. Please
activate Code PE by paging [phone].

Critical Value/emergent results were called by telephone at the time
of interpretation on 12/17/2017 at [DATE] to Dr. PAULUS N CEEJAY , who
verbally acknowledged these results.

## 2018-03-15 NOTE — Progress Notes (Signed)
[place] at [facility] [address], [place], [place] [address] [phone]  Date:  03/16/2018   Name:  Kathleen Miller   DOB:  [birth year]   MRN:  [id number]  PCP:  Darreld Mclean, MD    Chief Complaint: Leg Problem (c/o losing strength and mobility in both legs since surgery. )   History of Present Illness:  Kathleen Miller is a 72 y.o. very pleasant female patient who presents with the following: History of DM, HTN, obesity, spinal stenosis  I last saw her in February of this year, after she developed a PE post- operatively (ventral hernia repair)   Following up from recnet operation, then PE. She is off oxygen and feeling better.  However she now notes persistent HA and blood clots from left nostril Discussed her case with radiology who recommended MRI of her brain and face, with and wo Will work on getting these studies set up for pt.  She will seek care if any change or worsening of her sx.  As per pt instructions, was going to suggest afrin but she is already using it.  This may be causing some rebound congestion but also may help control bleeding.  Encouraged her to work on weaning off this medication  Lab Results  Component Value Date   HGBA1C 6.1 (H) 12/17/2017   She notes that her legs seem to have lost a lot of strength, and will feel numb and painful She started to notice this back in January when she got home from the hospital, but thought it was just due to deconditioning at first.  However, it has not gotten better and seems to have gotten worse She feels like she cannot trust her legs, she feels like she might fall  She does use her rolling walker about 65% of the time now- this is about double from how much she was using it in the past.  She also uses a shopping cart to lean on  She feels like her blood clot in her legs were caused by compression stockings that she used in the hospital   She had a DVT in  her RIGHT leg on [place] back in January She is still on eliquis from her recent PE  She notes that her legs are hurting and that they keep her awake She is using tramadol and flexeril at bedtime; they do help with her pain but she does not like to use them like this Her back has not really been hurting there that much recently.   She did have an MRI of her lumbar spine several years ago - she does have spinal stenosis per history   She does not have any implanted devices, she is able to have an MRI   Clots from her nose have resolved  No bowel incontinence.  She does have urinary urgency and may have urge incontinence but this is not new/  No groin numbness   Lab Results  Component Value Date   TSH 2.13 03/16/2018      Patient Active Problem List   Diagnosis Date Noted  . Pressure injury of skin 12/20/2017  . Pulmonary embolism ([place]) 12/17/2017  . Leukocytosis 12/17/2017  . Hyponatremia 12/17/2017  . ARF (acute renal failure) ([place]) 12/17/2017  . Incisional hernia 12/13/2017  . Chest pain 08/05/2017  . Abnormal nuclear stress test 08/05/2017  . Low bone mass 02/23/2017  . Chronic pain syndrome 07/18/2016  .  Dyslipidemia 07/12/2016  . LUQ pain 05/27/2016  . GERD (gastroesophageal reflux disease) 05/27/2016  . Encounter for screening mammogram for breast cancer 11/26/2014  . Slow transit constipation 06/06/2014  . Diabetes ([place]) 12/04/2013  . Chronic combined systolic and diastolic heart failure ([place]) 12/04/2013  . Essential hypertension, benign 12/04/2013  . Spinal stenosis of lumbar region 12/04/2013  . Morbid obesity ([place]) 08/04/2013  . Acute combined systolic and diastolic congestive heart failure ([place]) 08/02/2013  . Type II or unspecified type diabetes mellitus without mention of complication, uncontrolled 08/02/2013    Past Medical History:  Diagnosis Date  . Chronic back pain   . Hypothyroidism   . Pneumonia   . Pre-diabetes    takes metformin preventatively  .  Ventral hernia     Past Surgical History:  Procedure Laterality Date  . ABDOMINAL HYSTERECTOMY  11/2011  . CHOLECYSTECTOMY  1988  . COLONOSCOPY WITH PROPOFOL N/A 05/27/2016   Procedure: COLONOSCOPY WITH PROPOFOL;  Surgeon: Wilford Corner, MD;  [facility];  Service: Endoscopy;  Laterality: N/A;  . DILATION AND CURETTAGE OF UTERUS     x2  . ESOPHAGOGASTRODUODENOSCOPY (EGD) WITH PROPOFOL N/A 05/27/2016   Procedure: ESOPHAGOGASTRODUODENOSCOPY (EGD) WITH PROPOFOL;  Surgeon: Wilford Corner, MD;  [facility];  Service: Endoscopy;  Laterality: N/A;  . GASTRIC BYPASS    . GASTRIC ROUX-EN-Y N/A 12/13/2017   Procedure: LAPAROSCOPIC ASSTED VENTRAL HERNIA REPAIR, Upper Endo;  Surgeon: Excell Seltzer, MD;  [facility];  Service: General;  Laterality: N/A;  With MESH  . LEFT HEART CATH AND CORONARY ANGIOGRAPHY N/A 08/05/2017   Procedure: LEFT HEART CATH AND CORONARY ANGIOGRAPHY;  Surgeon: Martinique, Peter M, MD;  Location: [facility];  Service: Cardiovascular;  Laterality: N/A;  . revision gastric bypass     and ventrel hernia repair  Dr. Excell Seltzer 12-13-17  . SPLENECTOMY, TOTAL  1988    Social History   Tobacco Use  . Smoking status: Never Smoker  . Smokeless tobacco: Never Used  Substance Use Topics  . Alcohol use: No  . Drug use: No    No family history on file.  Allergies  Allergen Reactions  . Nsaids Swelling and Other (See Comments)    Severe swelling--water retention (including water on brain)  . Tolmetin Swelling and Other (See Comments)    Severe swelling-water retention (including water on brain)     Medication list has been reviewed and updated.  Current Outpatient Medications on File Prior to Visit  Medication Sig Dispense Refill  . apixaban (ELIQUIS) 5 MG TABS tablet Take 1 tablet (5 mg total) by mouth 2 (two) times daily. 60 tablet 6  . Ascorbic Acid (VITAMIN C) 1000 MG tablet Take 1,000 mg by mouth daily.    . cyclobenzaprine  (FLEXERIL) 10 MG tablet Take 10 mg by mouth 3 (three) times daily as needed for muscle spasms.    . furosemide (LASIX) 20 MG tablet Take 2 tablets (40 mg total) by mouth daily. Adjust dose as recommended by MD 90 tablet 2  . lovastatin (MEVACOR) 20 MG tablet Take 1 tablet (20 mg total) by mouth at bedtime. 90 tablet 3  . metFORMIN (GLUCOPHAGE) 500 MG tablet Take 1 tablet (500 mg total) by mouth 2 (two) times daily with a meal. 180 tablet 3  . thyroid (NP THYROID) 60 MG tablet Take 60 mg by mouth daily before breakfast.    . traMADol (ULTRAM) 50 MG tablet Take 1 tablet (50 mg total) by mouth every 6 (  six) hours as needed for moderate pain. 20 tablet 0   No current facility-administered medications on file prior to visit.     Review of Systems:  As per HPI- otherwise negative.   Physical Examination: Vitals:   03/16/18 1446  BP: 127/63  Pulse: 75  Temp: 98.4 F (36.9 C)  SpO2: 99%   Vitals:   03/16/18 1446  Weight: 265 lb 6.4 oz (120.4 kg)  Height: 5\' 4"  (1.626 m)   Body mass index is 45.56 kg/m. Ideal Body Weight: Weight in (lb) to have BMI = 25: 145.3  GEN: WDWN, NAD, Non-toxic, A & O x 3, elderly woman moving very slowly today . obese HEENT: Atraumatic, Normocephalic. Neck supple. No masses, No LAD. Ears and Nose: No external deformity. CV: RRR, No M/G/R. No JVD. No thrill. No extra heart sounds. PULM: CTA B, no wheezes, crackles, rhonchi. No retractions. No resp. distress. No accessory muscle use. ABD: S, NT, ND EXTR: No c/c/e NEURO slow, uses walker for support She does have decreased strength of her bilateral legs, to flexion and extension at knees. She is able to rise onto her toes but barely PSYCH: Normally interactive. Conversant. Not depressed or anxious appearing.  Calm demeanor.  Her calves are large and somewhat edematous bilaterally, but there is no tenderness in the posterior calf muscle to suggest an active clot   Assessment and Plan: Acute saddle  pulmonary embolism with acute cor pulmonale ([place]) - Plan: US Venous Img Lower Bilateral  Spinal stenosis of lumbosacral region - Plan: MR Lumbar Spine Wo Contrast  Acquired hypothyroidism - Plan: TSH  Leg weakness, bilateral - Plan: Ambulatory referral to Neurology  Following up today She had a ventral hernia repair in January and then developed DVT/ PE post op Here today with complaint of weakness of her bilateral legs which seems to be getting worse.  She feels like her legs may not work properly and that she may fall.  She does have known history of spinal stenosis. Last MRI several years ago.  Will set her up for an MRI, check her TSH today, referral to neurology to help [place] eval her leg weakness.  Hopefully will get her MRI back first in case she needs NSG evaluation  Asked her to alert me if any worsening!    Signed Lamar Blinks, MD  Received her TSH 4/19, letter to pt Results for orders placed or performed in visit on 03/16/18  TSH  Result Value Ref Range   TSH 2.13 0.40 - 4.50 mIU/L

## 2018-03-16 ENCOUNTER — Encounter: Payer: Self-pay | Admitting: Family Medicine

## 2018-03-16 ENCOUNTER — Ambulatory Visit (INDEPENDENT_AMBULATORY_CARE_PROVIDER_SITE_OTHER): Payer: Medicare HMO | Admitting: Family Medicine

## 2018-03-16 VITALS — BP 127/63 | HR 75 | Temp 98.4°F | Ht 64.0 in | Wt 265.4 lb

## 2018-03-16 DIAGNOSIS — R29898 Other symptoms and signs involving the musculoskeletal system: Secondary | ICD-10-CM | POA: Diagnosis not present

## 2018-03-16 DIAGNOSIS — E039 Hypothyroidism, unspecified: Secondary | ICD-10-CM

## 2018-03-16 DIAGNOSIS — I2602 Saddle embolus of pulmonary artery with acute cor pulmonale: Secondary | ICD-10-CM | POA: Diagnosis not present

## 2018-03-16 DIAGNOSIS — M4807 Spinal stenosis, lumbosacral region: Secondary | ICD-10-CM | POA: Diagnosis not present

## 2018-03-16 LAB — TSH: TSH: 2.13 mIU/L (ref 0.40–4.50)

## 2018-03-16 NOTE — Patient Instructions (Addendum)
We are going to get an [place] of your legs to check on your clot.  Also, I am going to get an MRI to evaluate your spine- I wonder if your spinal stenosis may be contributing to your symptoms.   We are also going to ask neurology to see you as I suspect a nerve issue is contributing to your leg weakness.    Hang in there- I hope that we have you feeling better soon

## 2018-03-21 ENCOUNTER — Other Ambulatory Visit: Payer: Medicare HMO

## 2018-03-24 ENCOUNTER — Ambulatory Visit
Admission: RE | Admit: 2018-03-24 | Discharge: 2018-03-24 | Disposition: A | Discharge status: Home / Self Care | Payer: Medicare HMO | Source: Ambulatory Visit | Attending: Family Medicine | Admitting: Family Medicine

## 2018-03-24 DIAGNOSIS — I2699 Other pulmonary embolism without acute cor pulmonale: Secondary | ICD-10-CM | POA: Diagnosis not present

## 2018-03-24 DIAGNOSIS — I2602 Saddle embolus of pulmonary artery with acute cor pulmonale: Secondary | ICD-10-CM

## 2018-03-30 ENCOUNTER — Telehealth: Payer: Self-pay | Admitting: Emergency Medicine

## 2018-03-30 NOTE — Telephone Encounter (Signed)
Called her and LMOM- I have not yet received her MRI.  Was it done at [facility]?

## 2018-03-30 NOTE — Telephone Encounter (Signed)
Copied from [place] [phone]. Topic: General - Other >> Mar 30, 2018 10:08 AM Carolyn Stare wrote:  Pt said she had MRI on 03/24/18 and would like a call back with the results  [phone]

## 2018-04-03 NOTE — Telephone Encounter (Signed)
Called [facility] - they do not have any MRI for her. The rep I spoke with stated that they had tried to schedule with her but she had declined and said that she wanted to talke to be first.  Called pt again- she did have an [place] however but she was confused and thought this was an MRI. Went over her LE [place] with her today-   IMPRESSION: No evidence of deep venous thrombosis in either lower extremity..  I am not sure why I had not gotten this report and discussed it with her earlier, but indeed she does not seem to have gotten the results until now. However assured her that she does not have any clot in her legs at this time    She is still convinced that her leg issue is due to compression stockings that were used in the hospital.  She is not sure if she wants to have the MRI done at this time or not; she notes that she "does not respect [facility]" and wants to know if I can order the MRI at [facility].  Advised her that I don't have privileges at [facility] and cannot order a study there I'm afraid.    Offered to get her MRI set up for her asap, but she does not want me to arrange this now, she wants to think about it and will let me know.

## 2018-04-03 NOTE — Telephone Encounter (Signed)
Pt called to let pcp know she had the mri done @ [facility], call pt to advise

## 2018-04-10 ENCOUNTER — Telehealth: Payer: Self-pay | Admitting: Family Medicine

## 2018-04-10 NOTE — Telephone Encounter (Signed)
Called her and [facility]- will request MRI to be done at [facility] on [address] as requested, however I cannot dictate that a certain radiologist read the report

## 2018-04-10 NOTE — Telephone Encounter (Signed)
Copied from [place] [phone]. Topic: Referral - Request >> Apr 10, 2018  1:25 PM Neva Seat wrote: MRI Lumbar Spine without contrast Pt requesting her referral if [facility] on [address] to do her MRI and for Dr. Elon Alas to read the results.

## 2018-04-20 ENCOUNTER — Ambulatory Visit: Payer: Medicare HMO | Admitting: Family Medicine

## 2018-04-20 ENCOUNTER — Encounter: Payer: Self-pay | Admitting: Family Medicine

## 2018-04-20 ENCOUNTER — Telehealth: Payer: Self-pay

## 2018-04-20 DIAGNOSIS — E118 Type 2 diabetes mellitus with unspecified complications: Secondary | ICD-10-CM

## 2018-04-20 DIAGNOSIS — J209 Acute bronchitis, unspecified: Secondary | ICD-10-CM

## 2018-04-20 DIAGNOSIS — I10 Essential (primary) hypertension: Secondary | ICD-10-CM | POA: Diagnosis not present

## 2018-04-20 MED ORDER — CEFDINIR 300 MG PO CAPS: 300.0000 mg | ORAL_CAPSULE | Freq: Two times a day (BID) | ORAL | 0 refills | Status: AC

## 2018-04-20 MED ORDER — METHYLPREDNISOLONE 4 MG PO TABS: ORAL_TABLET | ORAL | 0 refills | Status: DC

## 2018-04-20 MED ORDER — HYDROCODONE-HOMATROPINE 5-1.5 MG/5ML PO SYRP: 5.0000 mL | ORAL_SOLUTION | Freq: Three times a day (TID) | ORAL | 0 refills | Status: DC | PRN

## 2018-04-20 MED ORDER — ALBUTEROL SULFATE HFA 108 (90 BASE) MCG/ACT IN AERS
2.0000 | INHALATION_SPRAY | Freq: Four times a day (QID) | RESPIRATORY_TRACT | 0 refills | Status: DC | PRN
Start: 2018-04-20 — End: 2019-01-31

## 2018-04-20 NOTE — Patient Instructions (Addendum)
Plain Mucinex twice daily x 10 days and probiotic daily for a month or so such as [employer] or Culturelle Consider Elderberry liquid  Minimize carbohydrates while on the steroid. Can take an extra Metformin a day for a few days   Acute Bronchitis, Adult Acute bronchitis is when air tubes (bronchi) in the lungs suddenly get swollen. The condition can make it hard to breathe. It can also cause these symptoms:  A cough.  Coughing up clear, yellow, or green mucus.  Wheezing.  Chest congestion.  Shortness of breath.  A fever.  Body aches.  Chills.  A sore throat.  Follow these instructions at home: Medicines  Take over-the-counter and prescription medicines only as told by your doctor.  If you were prescribed an antibiotic medicine, take it as told by your doctor. Do not stop taking the antibiotic even if you start to feel better. General instructions  Rest.  Drink enough fluids to keep your pee (urine) clear or pale yellow.  Avoid smoking and secondhand smoke. If you smoke and you need help quitting, ask your doctor. Quitting will help your lungs heal faster.  Use an inhaler, cool mist vaporizer, or humidifier as told by your doctor.  Keep all follow-up visits as told by your doctor. This is important. How is this prevented? To lower your risk of getting this condition again:  Wash your hands often with soap and water. If you cannot use soap and water, use hand sanitizer.  Avoid contact with people who have cold symptoms.  Try not to touch your hands to your mouth, nose, or eyes.  Make sure to get the flu shot every year.  Contact a doctor if:  Your symptoms do not get better in 2 weeks. Get help right away if:  You cough up blood.  You have chest pain.  You have very bad shortness of breath.  You become dehydrated.  You faint (pass out) or keep feeling like you are going to pass out.  You keep throwing up (vomiting).  You have a very bad  headache.  Your fever or chills gets worse. This information is not intended to replace advice given to you by your health care provider. Make sure you discuss any questions you have with your health care provider. Document Released: 05/03/2008 Document Revised: 06/23/2016 Document Reviewed: 05/05/2016 Elsevier Interactive Patient Education  [employer].

## 2018-04-20 NOTE — Progress Notes (Signed)
Subjective:  I acted as a Education administrator for Dr. Charlett Blake. [place], [place]  Patient ID: Kathleen Miller, female    DOB: 1946-05-09, 72 y.o.   MRN: [id number]  No chief complaint on file.   HPI  Patient is in today for an acute visit for a cough. She has been ill for about 9 days now and is getting worse. She believes symptoms started with allergies flaring and head congestion. She takes Clarinex but her symptoms have now worsened. She has had a Tmax of 102.5, diarrhea, sore throat, nausea, fatigue, malaise, head and chest congestion. She notes SOB and wheezing at times. Denies CP/palp/HA or GU c/o. Taking meds as prescribed  Patient Care Team: Copland, Gay Filler, MD as PCP - General (Family Medicine) Dalton-Bethea, Fabio Asa, MD as Consulting Physician (Physical Medicine and Rehabilitation)   Past Medical History:  Diagnosis Date  . Chronic back pain   . Hypothyroidism   . Pneumonia   . Pre-diabetes    takes metformin preventatively  . Ventral hernia     Past Surgical History:  Procedure Laterality Date  . ABDOMINAL HYSTERECTOMY  11/2011  . CHOLECYSTECTOMY  1988  . COLONOSCOPY WITH PROPOFOL N/A 05/27/2016   Procedure: COLONOSCOPY WITH PROPOFOL;  Surgeon: Wilford Corner, MD;  Location: [facility];  Service: Endoscopy;  Laterality: N/A;  . DILATION AND CURETTAGE OF UTERUS     x2  . ESOPHAGOGASTRODUODENOSCOPY (EGD) WITH PROPOFOL N/A 05/27/2016   Procedure: ESOPHAGOGASTRODUODENOSCOPY (EGD) WITH PROPOFOL;  Surgeon: Wilford Corner, MD;  Location: [facility];  Service: Endoscopy;  Laterality: N/A;  . GASTRIC BYPASS    . GASTRIC ROUX-EN-Y N/A 12/13/2017   Procedure: LAPAROSCOPIC ASSTED VENTRAL HERNIA REPAIR, Upper Endo;  Surgeon: Excell Seltzer, MD;  Location: [facility];  Service: General;  Laterality: N/A;  With MESH  . LEFT HEART CATH AND CORONARY ANGIOGRAPHY N/A 08/05/2017   Procedure: LEFT HEART CATH AND CORONARY ANGIOGRAPHY;  Surgeon: Martinique, Peter M, MD;  Location: [facility];   Service: Cardiovascular;  Laterality: N/A;  . revision gastric bypass     and ventrel hernia repair  Dr. Excell Seltzer 12-13-17  . SPLENECTOMY, TOTAL  1988    History reviewed. No pertinent family history.  Social History   Socioeconomic History  . Marital status: Divorced    Spouse name: Not on file  . Number of children: Not on file  . Years of education: Not on file  . Highest education level: Not on file  Occupational History  . Not on file  Social Needs  . Financial resource strain: Not on file  . Food insecurity:    Worry: Not on file    Inability: Not on file  . Transportation needs:    Medical: Not on file    Non-medical: Not on file  Tobacco Use  . Smoking status: Never Smoker  . Smokeless tobacco: Never Used  Substance and Sexual Activity  . Alcohol use: No  . Drug use: No  . Sexual activity: Never  Lifestyle  . Physical activity:    Days per week: Not on file    Minutes per session: Not on file  . Stress: Not on file  Relationships  . Social connections:    Talks on phone: Not on file    Gets together: Not on file    Attends religious service: Not on file    Active member of club or organization: Not on file    Attends meetings of clubs or organizations: Not on file  Relationship status: Not on file  . Intimate partner violence:    Fear of current or ex partner: Not on file    Emotionally abused: Not on file    Physically abused: Not on file    Forced sexual activity: Not on file  Other Topics Concern  . Not on file  Social History Narrative  . Not on file    Outpatient Medications Prior to Visit  Medication Sig Dispense Refill  . apixaban (ELIQUIS) 5 MG TABS tablet Take 1 tablet (5 mg total) by mouth 2 (two) times daily. 60 tablet 6  . Ascorbic Acid (VITAMIN C) 1000 MG tablet Take 1,000 mg by mouth daily.    . cyclobenzaprine (FLEXERIL) 10 MG tablet Take 10 mg by mouth 3 (three) times daily as needed for muscle spasms.    . furosemide (LASIX) 20  MG tablet Take 2 tablets (40 mg total) by mouth daily. Adjust dose as recommended by MD 90 tablet 2  . lovastatin (MEVACOR) 20 MG tablet Take 1 tablet (20 mg total) by mouth at bedtime. 90 tablet 3  . metFORMIN (GLUCOPHAGE) 500 MG tablet Take 1 tablet (500 mg total) by mouth 2 (two) times daily with a meal. 180 tablet 3  . thyroid (NP THYROID) 60 MG tablet Take 60 mg by mouth daily before breakfast.    . traMADol (ULTRAM) 50 MG tablet Take 1 tablet (50 mg total) by mouth every 6 (six) hours as needed for moderate pain. 20 tablet 0   No facility-administered medications prior to visit.     Allergies  Allergen Reactions  . Nsaids Swelling and Other (See Comments)    Severe swelling--water retention (including water on brain)  . Tolmetin Swelling and Other (See Comments)    Severe swelling-water retention (including water on brain)     Review of Systems  Constitutional: Positive for fever. Negative for malaise/fatigue.  HENT: Positive for congestion and sinus pain.   Eyes: Negative for blurred vision.  Respiratory: Positive for cough, sputum production, shortness of breath and wheezing.   Cardiovascular: Negative for chest pain, palpitations and leg swelling.  Gastrointestinal: Positive for diarrhea and nausea. Negative for abdominal pain and blood in stool.  Genitourinary: Negative for dysuria and frequency.  Musculoskeletal: Negative for falls.  Skin: Negative for rash.  Neurological: Negative for dizziness, loss of consciousness and headaches.  Endo/Heme/Allergies: Negative for environmental allergies.  Psychiatric/Behavioral: Negative for depression. The patient is not nervous/anxious.        Objective:    Physical Exam  Constitutional: She is oriented to person, place, and time. No distress.  HENT:  Head: Normocephalic and atraumatic.  Eyes: Conjunctivae are normal.  Neck: Neck supple. No thyromegaly present.  Cardiovascular: Normal rate, regular rhythm and normal heart  sounds.  No murmur heard. Pulmonary/Chest: Effort normal. She has wheezes.  Scattered expiratory wheezing bilaterally  Abdominal: She exhibits no distension and no mass.  Musculoskeletal: She exhibits no edema.  Lymphadenopathy:    She has no cervical adenopathy.  Neurological: She is alert and oriented to person, place, and time.  Skin: Skin is warm and dry. No rash noted. She is not diaphoretic.  Psychiatric: Judgment normal.    BP 118/70 (BP Location: Left Arm, Patient Position: Sitting, Cuff Size: Normal)   Pulse 69   Temp 98 F (36.7 C) (Oral)   Resp 18   Wt 265 lb 9.6 oz (120.5 kg)   SpO2 98%   BMI 45.59 kg/m  Wt Readings from Last 3  Encounters:  04/20/18 265 lb 9.6 oz (120.5 kg)  03/16/18 265 lb 6.4 oz (120.4 kg)  01/19/18 251 lb 9.6 oz (114.1 kg)   BP Readings from Last 3 Encounters:  04/20/18 118/70  03/16/18 127/63  01/19/18 124/78     Immunization History  Administered Date(s) Administered  . Influenza Split 08/11/2016  . Influenza, High Dose Seasonal PF 12/14/2017  . Influenza,inj,Quad PF,6+ Mos 11/26/2014  . Pneumococcal Conjugate-13 01/26/2017  . Pneumococcal Polysaccharide-23 08/02/2013, 11/26/2014    Health Maintenance  Topic Date Due  . FOOT EXAM  07/01/1958  . TETANUS/TDAP  07/02/1967  . URINE MICROALBUMIN  08/12/2017  . OPHTHALMOLOGY EXAM  02/10/2018  . HEMOGLOBIN A1C  06/16/2018  . INFLUENZA VACCINE  06/29/2018  . MAMMOGRAM  09/21/2019  . COLONOSCOPY  05/27/2026  . DEXA SCAN  Completed  . Hepatitis C Screening  Completed  . PNA vac Low Risk Adult  Completed    Lab Results  Component Value Date   WBC 10.9 (H) 12/26/2017   HGB 11.2 (L) 12/26/2017   HCT 35.8 (L) 12/26/2017   PLT 550.0 (H) 12/26/2017   GLUCOSE 96 12/26/2017   CHOL 149 01/26/2017   TRIG 158 (H) 01/26/2017   HDL 35 (L) 01/26/2017   LDLDIRECT 124.0 07/12/2016   LDLCALC 82 01/26/2017   ALT 16 12/26/2017   AST 11 12/26/2017   NA 140 12/26/2017   K 4.1 12/26/2017   CL  98 12/26/2017   CREATININE 0.79 12/26/2017   BUN 7 12/26/2017   CO2 35 (H) 12/26/2017   TSH 2.13 03/16/2018   INR 1.0 07/29/2017   HGBA1C 6.1 (H) 12/17/2017   MICROALBUR <0.7 08/12/2016    Lab Results  Component Value Date   TSH 2.13 03/16/2018   Lab Results  Component Value Date   WBC 10.9 (H) 12/26/2017   HGB 11.2 (L) 12/26/2017   HCT 35.8 (L) 12/26/2017   MCV 82.1 12/26/2017   PLT 550.0 (H) 12/26/2017   Lab Results  Component Value Date   NA 140 12/26/2017   K 4.1 12/26/2017   CO2 35 (H) 12/26/2017   GLUCOSE 96 12/26/2017   BUN 7 12/26/2017   CREATININE 0.79 12/26/2017   BILITOT 0.7 12/26/2017   ALKPHOS 88 12/26/2017   AST 11 12/26/2017   ALT 16 12/26/2017   PROT 6.9 12/26/2017   ALBUMIN 3.5 12/26/2017   CALCIUM 8.9 12/26/2017   ANIONGAP 11 12/20/2017   GFR 76.59 12/26/2017   Lab Results  Component Value Date   CHOL 149 01/26/2017   Lab Results  Component Value Date   HDL 35 (L) 01/26/2017   Lab Results  Component Value Date   LDLCALC 82 01/26/2017   Lab Results  Component Value Date   TRIG 158 (H) 01/26/2017   Lab Results  Component Value Date   CHOLHDL 4.3 01/26/2017   Lab Results  Component Value Date   HGBA1C 6.1 (H) 12/17/2017         Assessment & Plan:   Problem List Items Addressed This Visit    Diabetes ([place])    Warned that sugars can spike with use of steroids. She is encouraged to avoid carbohydrates the next 5 days and rely more on proteins and veg, can take an extra tab of Metformin daily if sugars spike and let us know      Essential hypertension, benign    Well controlled, no changes to meds. Encouraged heart healthy diet such as the DASH diet and exercise as tolerated.  Acute bronchitis    Started on Cefdinir and Medrol. Encouraged Mucinex and Encouraged increased rest and hydration, add probiotics, zinc such as Coldeze or Xicam. Treat fevers as needed         I am having [name] start on  methylPREDNISolone, cefdinir, HYDROcodone-homatropine, and albuterol. I am also having her maintain her metFORMIN, NP THYROID, cyclobenzaprine, vitamin C, traMADol, lovastatin, furosemide, and apixaban.  Meds ordered this encounter  Medications  . methylPREDNISolone (MEDROL) 4 MG tablet    Sig: 5 tab po qd X 1d then 4 tab po qd X 1d then 3 tab po qd X 1d then 2 tab po qd then 1 tab po qd    Dispense:  15 tablet    Refill:  0  . cefdinir (OMNICEF) 300 MG capsule    Sig: Take 1 capsule (300 mg total) by mouth 2 (two) times daily for 10 days.    Dispense:  20 capsule    Refill:  0  . HYDROcodone-homatropine (HYCODAN) 5-1.5 MG/5ML syrup    Sig: Take 5 mLs by mouth every 8 (eight) hours as needed for cough.    Dispense:  120 mL    Refill:  0  . albuterol (PROVENTIL HFA;VENTOLIN HFA) 108 (90 Base) MCG/ACT inhaler    Sig: Inhale 2 puffs into the lungs every 6 (six) hours as needed for wheezing or shortness of breath.    Dispense:  1 Inhaler    Refill:  0    CMA served as scribe during this visit. History, Physical and Plan performed by medical provider. Documentation and orders reviewed and attested to.  Penni Homans, MD

## 2018-04-20 NOTE — Telephone Encounter (Signed)
Patient seeing Dr. Charlett Blake today for possible pneumonia.

## 2018-04-20 NOTE — Telephone Encounter (Signed)
Copied from [place] [phone]. Topic: Appointment Scheduling - Same Day Appointment >> Apr 19, 2018 11:30 AM Percell Belt A wrote: Patient called to schedule an appointment for TODAY with   Pt is not feeling well feels like she may have Pneumonia .  She stated she is in [place] right now but she would like to know if anyone could see her about 2 today?    Best number [phone]

## 2018-04-24 ENCOUNTER — Telehealth: Payer: Self-pay | Admitting: Family Medicine

## 2018-04-24 DIAGNOSIS — J209 Acute bronchitis, unspecified: Secondary | ICD-10-CM | POA: Insufficient documentation

## 2018-04-24 NOTE — Telephone Encounter (Signed)
It looks like she had decided to have her MRI done at [facility] on [address], but I don't see where this has been arranged.  Will touch base with Mar Daring- I had ordered an MRI of her L spine, to be one at [facility].  Can you please check on this?  Thank you!

## 2018-04-24 NOTE — Assessment & Plan Note (Signed)
Well controlled, no changes to meds. Encouraged heart healthy diet such as the DASH diet and exercise as tolerated.  °

## 2018-04-24 NOTE — Assessment & Plan Note (Signed)
Warned that sugars can spike with use of steroids. She is encouraged to avoid carbohydrates the next 5 days and rely more on proteins and veg, can take an extra tab of Metformin daily if sugars spike and let us know

## 2018-04-24 NOTE — Telephone Encounter (Signed)
-----   Message from Mosie Lukes, MD sent at 04/20/2018  9:46 AM EDT ----- Patient in today for acute bronchitis, she is asking if the MRI has been moved to the location she prefers? She has not heard anything.

## 2018-04-24 NOTE — Assessment & Plan Note (Signed)
Started on Cefdinir and Medrol. Encouraged Mucinex and Encouraged increased rest and hydration, add probiotics, zinc such as Coldeze or Xicam. Treat fevers as needed

## 2018-05-19 DIAGNOSIS — M47816 Spondylosis without myelopathy or radiculopathy, lumbar region: Secondary | ICD-10-CM | POA: Diagnosis not present

## 2018-05-19 DIAGNOSIS — M419 Scoliosis, unspecified: Secondary | ICD-10-CM | POA: Diagnosis not present

## 2018-05-19 DIAGNOSIS — M5126 Other intervertebral disc displacement, lumbar region: Secondary | ICD-10-CM | POA: Diagnosis not present

## 2018-05-19 DIAGNOSIS — M5417 Radiculopathy, lumbosacral region: Secondary | ICD-10-CM | POA: Diagnosis not present

## 2018-05-23 DIAGNOSIS — M1712 Unilateral primary osteoarthritis, left knee: Secondary | ICD-10-CM | POA: Diagnosis not present

## 2018-06-13 DIAGNOSIS — M48061 Spinal stenosis, lumbar region without neurogenic claudication: Secondary | ICD-10-CM | POA: Diagnosis not present

## 2018-06-13 DIAGNOSIS — M792 Neuralgia and neuritis, unspecified: Secondary | ICD-10-CM | POA: Diagnosis not present

## 2018-06-13 DIAGNOSIS — M5137 Other intervertebral disc degeneration, lumbosacral region: Secondary | ICD-10-CM | POA: Diagnosis not present

## 2018-06-24 ENCOUNTER — Other Ambulatory Visit: Payer: Self-pay | Admitting: Family Medicine

## 2018-06-24 DIAGNOSIS — R600 Localized edema: Secondary | ICD-10-CM

## 2018-06-24 DIAGNOSIS — I5042 Chronic combined systolic (congestive) and diastolic (congestive) heart failure: Secondary | ICD-10-CM

## 2018-07-14 DIAGNOSIS — M792 Neuralgia and neuritis, unspecified: Secondary | ICD-10-CM | POA: Diagnosis not present

## 2018-07-14 DIAGNOSIS — M5137 Other intervertebral disc degeneration, lumbosacral region: Secondary | ICD-10-CM | POA: Diagnosis not present

## 2018-07-14 DIAGNOSIS — M23301 Other meniscus derangements, unspecified lateral meniscus, left knee: Secondary | ICD-10-CM | POA: Diagnosis not present

## 2018-07-14 DIAGNOSIS — M48061 Spinal stenosis, lumbar region without neurogenic claudication: Secondary | ICD-10-CM | POA: Diagnosis not present

## 2018-07-19 DIAGNOSIS — M48061 Spinal stenosis, lumbar region without neurogenic claudication: Secondary | ICD-10-CM | POA: Diagnosis not present

## 2018-07-19 DIAGNOSIS — Z6841 Body Mass Index (BMI) 40.0 and over, adult: Secondary | ICD-10-CM | POA: Diagnosis not present

## 2018-07-19 DIAGNOSIS — I2699 Other pulmonary embolism without acute cor pulmonale: Secondary | ICD-10-CM | POA: Diagnosis not present

## 2018-07-19 DIAGNOSIS — I5042 Chronic combined systolic (congestive) and diastolic (congestive) heart failure: Secondary | ICD-10-CM | POA: Diagnosis not present

## 2018-07-19 DIAGNOSIS — G894 Chronic pain syndrome: Secondary | ICD-10-CM | POA: Diagnosis not present

## 2018-07-19 DIAGNOSIS — M792 Neuralgia and neuritis, unspecified: Secondary | ICD-10-CM | POA: Diagnosis not present

## 2018-07-19 DIAGNOSIS — M5137 Other intervertebral disc degeneration, lumbosacral region: Secondary | ICD-10-CM | POA: Diagnosis not present

## 2018-07-19 DIAGNOSIS — R0602 Shortness of breath: Secondary | ICD-10-CM | POA: Diagnosis not present

## 2018-07-24 DIAGNOSIS — M48061 Spinal stenosis, lumbar region without neurogenic claudication: Secondary | ICD-10-CM | POA: Diagnosis not present

## 2018-07-24 DIAGNOSIS — M5137 Other intervertebral disc degeneration, lumbosacral region: Secondary | ICD-10-CM | POA: Diagnosis not present

## 2018-07-24 DIAGNOSIS — I5042 Chronic combined systolic (congestive) and diastolic (congestive) heart failure: Secondary | ICD-10-CM | POA: Diagnosis not present

## 2018-07-24 DIAGNOSIS — S83282D Other tear of lateral meniscus, current injury, left knee, subsequent encounter: Secondary | ICD-10-CM | POA: Diagnosis not present

## 2018-07-24 DIAGNOSIS — I2699 Other pulmonary embolism without acute cor pulmonale: Secondary | ICD-10-CM | POA: Diagnosis not present

## 2018-07-24 DIAGNOSIS — M792 Neuralgia and neuritis, unspecified: Secondary | ICD-10-CM | POA: Diagnosis not present

## 2018-09-14 DIAGNOSIS — M48061 Spinal stenosis, lumbar region without neurogenic claudication: Secondary | ICD-10-CM | POA: Diagnosis not present

## 2018-09-14 DIAGNOSIS — S83282D Other tear of lateral meniscus, current injury, left knee, subsequent encounter: Secondary | ICD-10-CM | POA: Diagnosis not present

## 2018-09-14 DIAGNOSIS — M48062 Spinal stenosis, lumbar region with neurogenic claudication: Secondary | ICD-10-CM | POA: Diagnosis not present

## 2018-09-14 DIAGNOSIS — G894 Chronic pain syndrome: Secondary | ICD-10-CM | POA: Diagnosis not present

## 2018-10-30 ENCOUNTER — Other Ambulatory Visit: Payer: Self-pay | Admitting: Cardiology

## 2018-10-30 DIAGNOSIS — E119 Type 2 diabetes mellitus without complications: Secondary | ICD-10-CM

## 2019-01-15 ENCOUNTER — Other Ambulatory Visit: Payer: Self-pay | Admitting: Family Medicine

## 2019-01-15 ENCOUNTER — Other Ambulatory Visit (HOSPITAL_COMMUNITY): Payer: Self-pay | Admitting: Cardiology

## 2019-01-15 DIAGNOSIS — E119 Type 2 diabetes mellitus without complications: Secondary | ICD-10-CM

## 2019-01-15 DIAGNOSIS — E785 Hyperlipidemia, unspecified: Secondary | ICD-10-CM

## 2019-01-20 ENCOUNTER — Other Ambulatory Visit (HOSPITAL_COMMUNITY): Payer: Self-pay | Admitting: Cardiology

## 2019-01-20 DIAGNOSIS — E119 Type 2 diabetes mellitus without complications: Secondary | ICD-10-CM

## 2019-01-22 NOTE — Telephone Encounter (Signed)
Last filled by Dr. Martinique in 2018. Patient to follow up as needed. Does she need to contact PCP?

## 2019-01-31 ENCOUNTER — Ambulatory Visit (HOSPITAL_BASED_OUTPATIENT_CLINIC_OR_DEPARTMENT_OTHER)
Admission: RE | Admit: 2019-01-31 | Discharge: 2019-01-31 | Disposition: A | Discharge status: Home / Self Care | Payer: Medicare Other | Source: Ambulatory Visit | Attending: Family Medicine | Admitting: Family Medicine

## 2019-01-31 ENCOUNTER — Encounter: Payer: Self-pay | Admitting: Family Medicine

## 2019-01-31 ENCOUNTER — Ambulatory Visit (INDEPENDENT_AMBULATORY_CARE_PROVIDER_SITE_OTHER): Payer: Medicare Other | Admitting: Family Medicine

## 2019-01-31 VITALS — BP 118/70 | HR 84 | Temp 98.1°F | Resp 20 | Ht 64.0 in | Wt 272.0 lb

## 2019-01-31 DIAGNOSIS — Z130 Encounter for screening for diseases of the blood and blood-forming organs and certain disorders involving the immune mechanism: Secondary | ICD-10-CM | POA: Diagnosis not present

## 2019-01-31 DIAGNOSIS — E039 Hypothyroidism, unspecified: Secondary | ICD-10-CM | POA: Diagnosis not present

## 2019-01-31 DIAGNOSIS — R509 Fever, unspecified: Secondary | ICD-10-CM

## 2019-01-31 DIAGNOSIS — E119 Type 2 diabetes mellitus without complications: Secondary | ICD-10-CM | POA: Diagnosis not present

## 2019-01-31 DIAGNOSIS — E785 Hyperlipidemia, unspecified: Secondary | ICD-10-CM | POA: Diagnosis not present

## 2019-01-31 DIAGNOSIS — Z7901 Long term (current) use of anticoagulants: Secondary | ICD-10-CM

## 2019-01-31 DIAGNOSIS — J40 Bronchitis, not specified as acute or chronic: Secondary | ICD-10-CM

## 2019-01-31 DIAGNOSIS — R059 Cough, unspecified: Secondary | ICD-10-CM

## 2019-01-31 DIAGNOSIS — M4807 Spinal stenosis, lumbosacral region: Secondary | ICD-10-CM

## 2019-01-31 DIAGNOSIS — R05 Cough: Secondary | ICD-10-CM

## 2019-01-31 MED ORDER — DOXYCYCLINE HYCLATE 100 MG PO CAPS: 100.0000 mg | ORAL_CAPSULE | Freq: Two times a day (BID) | ORAL | 0 refills | Status: DC

## 2019-01-31 MED ORDER — TRAMADOL HCL 50 MG PO TABS: 50.0000 mg | ORAL_TABLET | Freq: Four times a day (QID) | ORAL | 0 refills | Status: DC | PRN

## 2019-01-31 MED ORDER — LOVASTATIN 20 MG PO TABS: 20.0000 mg | ORAL_TABLET | Freq: Every day | ORAL | 3 refills | Status: DC

## 2019-01-31 MED ORDER — CYCLOBENZAPRINE HCL 10 MG PO TABS: 5.0000 mg | ORAL_TABLET | Freq: Three times a day (TID) | ORAL | 0 refills | Status: DC | PRN

## 2019-01-31 MED ORDER — CEFDINIR 300 MG PO CAPS: 300.0000 mg | ORAL_CAPSULE | Freq: Two times a day (BID) | ORAL | 0 refills | Status: DC

## 2019-01-31 MED ORDER — APIXABAN 5 MG PO TABS: 5.0000 mg | ORAL_TABLET | Freq: Two times a day (BID) | ORAL | 3 refills | Status: DC

## 2019-01-31 MED ORDER — ALBUTEROL SULFATE HFA 108 (90 BASE) MCG/ACT IN AERS: 2.0000 | INHALATION_SPRAY | Freq: Four times a day (QID) | RESPIRATORY_TRACT | 2 refills | Status: DC | PRN

## 2019-01-31 MED ORDER — METFORMIN HCL 500 MG PO TABS: 500.0000 mg | ORAL_TABLET | Freq: Two times a day (BID) | ORAL | 3 refills | Status: DC

## 2019-01-31 MED ORDER — THYROID 60 MG PO TABS: 60.0000 mg | ORAL_TABLET | Freq: Every day | ORAL | 3 refills | Status: DC

## 2019-01-31 NOTE — Patient Instructions (Addendum)
It was good to see you today, but I am sorry you are not feeling well.  I think you likely have bronchitis. We will treat you with Omnicef antibiotic- changed to doxy, pt aware , take it twice a day for 10 days Also albuterol inhaler, take 2 puffs every 6 hours or so as needed for coughing or wheezing If you are getting worse please seek help right away, if you are not feeling better in the next few days please let us know  I will be in touch with the rest of your labs ASAP

## 2019-01-31 NOTE — Progress Notes (Addendum)
[place] at [facility] [place], [place], [place] [address] [phone]  Date:  01/31/2019   Name:  Kathleen Miller   DOB:  [birth year]   MRN:  [id number]  PCP:  Darreld Mclean, MD    Chief Complaint: URI (congestion, cough, runny nose, hoarse voice, shortness of breath, fever this weekend 102 fever)   History of Present Illness:  Kathleen Miller is a 73 y.o. very pleasant female patient who presents with the following:  Last seen by myself in April 2019-history of diabetes, hypertension, obesity, spinal stenosis She had a pulmonary embolism last year following a ventral hernia repair-she is maintained on Eliquis Here today with concern of illness  She travels a lot for her job, has been exposed to a lot of sick people She first started getting sick 3 weeks ago with nasal sx. She thought it was a sinus issue Took some OTC sinus meds and nasal spray- did not seem to help Then developed a cough, got worse over this past weekend She is wheezing some She had a temp to 102 about 4 days ago-has not measured a significant fever since She has felt tired and achy  She is still taking Eliquis 5 twice daily, so recurrent pulmonary embolism should be very unlikely She has not been particularly short of breath, except as she would expect with illness  She also had some vomiting 4 days ago, this is now resolved  She also wishes to discuss weight loss - she has gained some weight recently due to stress  Wonders if I can start her on a weight loss medication  However, as we have not seen her in some time we need to catch up on things, will defer this concern for another day  She also needs refills of her maintenance medications, and routine labs today  Lab Results  Component Value Date   HGBA1C 6.1 (H) 12/17/2017     Patient Active Problem List   Diagnosis Date Noted  . Acute bronchitis 04/24/2018  . Pressure injury of  skin 12/20/2017  . Pulmonary embolism ([place]) 12/17/2017  . Leukocytosis 12/17/2017  . Hyponatremia 12/17/2017  . ARF (acute renal failure) ([place]) 12/17/2017  . Incisional hernia 12/13/2017  . Chest pain 08/05/2017  . Abnormal nuclear stress test 08/05/2017  . Low bone mass 02/23/2017  . Chronic pain syndrome 07/18/2016  . Dyslipidemia 07/12/2016  . LUQ pain 05/27/2016  . GERD (gastroesophageal reflux disease) 05/27/2016  . Encounter for screening mammogram for breast cancer 11/26/2014  . Slow transit constipation 06/06/2014  . Diabetes ([place]) 12/04/2013  . Chronic combined systolic and diastolic heart failure ([place]) 12/04/2013  . Essential hypertension, benign 12/04/2013  . Spinal stenosis of lumbar region 12/04/2013  . Morbid obesity ([place]) 08/04/2013  . Acute combined systolic and diastolic congestive heart failure ([place]) 08/02/2013  . Type II or unspecified type diabetes mellitus without mention of complication, uncontrolled 08/02/2013    Past Medical History:  Diagnosis Date  . Chronic back pain   . Hypothyroidism   . Pneumonia   . Pre-diabetes    takes metformin preventatively  . Ventral hernia     Past Surgical History:  Procedure Laterality Date  . ABDOMINAL HYSTERECTOMY  11/2011  . CHOLECYSTECTOMY  1988  . COLONOSCOPY WITH PROPOFOL N/A 05/27/2016   Procedure: COLONOSCOPY WITH PROPOFOL;  Surgeon: Wilford Corner, MD;  Location: [facility];  Service: Endoscopy;  Laterality: N/A;  . DILATION  AND CURETTAGE OF UTERUS     x2  . ESOPHAGOGASTRODUODENOSCOPY (EGD) WITH PROPOFOL N/A 05/27/2016   Procedure: ESOPHAGOGASTRODUODENOSCOPY (EGD) WITH PROPOFOL;  Surgeon: Wilford Corner, MD;  Location: [facility];  Service: Endoscopy;  Laterality: N/A;  . GASTRIC BYPASS    . GASTRIC ROUX-EN-Y N/A 12/13/2017   Procedure: LAPAROSCOPIC ASSTED VENTRAL HERNIA REPAIR, Upper Endo;  Surgeon: Excell Seltzer, MD;  Location: [facility];  Service: General;  Laterality: N/A;  With MESH  . LEFT  HEART CATH AND CORONARY ANGIOGRAPHY N/A 08/05/2017   Procedure: LEFT HEART CATH AND CORONARY ANGIOGRAPHY;  Surgeon: Martinique, Peter M, MD;  Location: [facility];  Service: Cardiovascular;  Laterality: N/A;  . revision gastric bypass     and ventrel hernia repair  Dr. Excell Seltzer 12-13-17  . SPLENECTOMY, TOTAL  1988    Social History   Tobacco Use  . Smoking status: Never Smoker  . Smokeless tobacco: Never Used  Substance Use Topics  . Alcohol use: No  . Drug use: No    History reviewed. No pertinent family history.  Allergies  Allergen Reactions  . Nsaids Swelling and Other (See Comments)    Severe swelling--water retention (including water on brain)  . Tolmetin Swelling and Other (See Comments)    Severe swelling-water retention (including water on brain)     Medication list has been reviewed and updated.  Current Outpatient Medications on File Prior to Visit  Medication Sig Dispense Refill  . cyclobenzaprine (FLEXERIL) 10 MG tablet Take 10 mg by mouth 3 (three) times daily as needed for muscle spasms.    Marland Kitchen ELIQUIS 5 MG TABS tablet TAKE 1 TABLET BY MOUTH TWICE DAILY 60 tablet 5  . lovastatin (MEVACOR) 20 MG tablet TAKE 1 TABLET BY MOUTH AT BEDTIME 90 tablet 1  . metFORMIN (GLUCOPHAGE) 500 MG tablet Take 1 tablet (500 mg total) by mouth 2 (two) times daily with a meal. 180 tablet 3  . thyroid (NP THYROID) 60 MG tablet Take 60 mg by mouth daily before breakfast.    . traMADol (ULTRAM) 50 MG tablet Take 1 tablet (50 mg total) by mouth every 6 (six) hours as needed for moderate pain. 20 tablet 0  . furosemide (LASIX) 20 MG tablet TAKE 2 TABLETS BY MOUTH ONCE DAILY. ADJUST DOSE AS RECOMMENDED BY MD (Patient not taking: Reported on 01/31/2019) 180 tablet 1   No current facility-administered medications on file prior to visit.     Review of Systems:  As per HPI- otherwise negative. She did take fever reducers today   11/05/2018  3   10/27/2018  Tramadol Hcl 50 MG Tablet  90.00  30 [address]   [id number]   [facility] ([id number])   0  15.00 MME  Medicare   [place]  05/09/2018  3   11/10/2017  Tramadol Hcl 50 MG Tablet  21.00 7 [address]   [id number]   [facility] ([id number])   1  15.00 MME  Medicare   [place]  04/20/2018  3   04/20/2018  Hydromet Syrup  105.00 [address]   [id number]   [facility] ([id number])   0  15.00 MME  Private Pay   [place]  12/16/2017  3   12/14/2017  Tramadol Hcl 50 MG Tablet  20.00 [address]   [id number]   [facility] ([id number])   0  20.00 MME  Medicare   [place]  11/10/2017  2   11/10/2017  Tramadol Hcl 50 MG Tablet  90.00 30 [address]  [id number]   [facility] ([id number])   0  15.00 MME  Medicare   [place]  08/16/2017  2   05/09/2017  Tramadol Hcl 50 MG Tablet  90.00 30 [address]   [id number]   [facility] ([id number])   1  15.00 MME        Physical Examination: Vitals:   01/31/19 1535  BP: 118/70  Pulse: 84  Resp: 20  Temp: 98.1 F (36.7 C)  SpO2: 97%   Vitals:   01/31/19 1535  Weight: 272 lb (123.4 kg)  Height: '5\' 4"'  (1.626 m)   Body mass index is 46.69 kg/m. Ideal Body Weight: Weight in (lb) to have BMI = 25: 145.3  GEN: WDWN, NAD, Non-toxic, A & O x 3, obese, looks well HEENT: Atraumatic, Normocephalic. Neck supple. No masses, No LAD. Bilateral TM wnl, oropharynx normal.  PEERL,EOMI.   Ears and Nose: No external deformity. CV: RRR, No M/G/R. No JVD. No thrill. No extra heart sounds. PULM: Rhonchi heard in the right lung, no crackles or wheezing. No retractions. No resp. distress. No accessory muscle use. ABD: S, NT, ND EXTR: No c/c/e NEURO Normal gait for patient, uses a cane and tends to bend forward due to her spinal issues PSYCH: Normally interactive. Conversant. Not depressed or anxious appearing.  Calm demeanor.   Dg Chest 2 View  Result Date: 01/31/2019 CLINICAL DATA:  Fever and shortness of breath for 3 weeks. EXAM: CHEST - 2 VIEW COMPARISON:  . 07/24/2018 and prior radiographs FINDINGS: The cardiomediastinal silhouette is unremarkable. Elevation of the RIGHT hemidiaphragm and mild interstitial prominence again noted. There is no evidence of  focal airspace disease, pulmonary edema, suspicious pulmonary nodule/mass, pleural effusion, or pneumothorax. No acute bony abnormalities are identified. IMPRESSION: No acute abnormality. Electronically Signed   By: Margarette Canada M.D.   On: 01/31/2019 17:04   Flu test negative  Results for orders placed or performed in visit on 01/31/19  POC Influenza A&B (Binax test)  Result Value Ref Range   Influenza A, POC Negative Negative   Influenza B, POC Negative Negative     Assessment and Plan: Fever, unspecified fever cause - Plan: POC Influenza A&B (Binax test), DG Chest 2 View  Dyslipidemia - Plan: lovastatin (MEVACOR) 20 MG tablet, Lipid panel  Controlled type 2 diabetes mellitus without complication, without long-term current use of insulin (HCC) - Plan: metFORMIN (GLUCOPHAGE) 500 MG tablet, Comprehensive metabolic panel, Hemoglobin A1c  Acquired hypothyroidism - Plan: thyroid (NP THYROID) 60 MG tablet, TSH  Spinal stenosis of lumbosacral region - Plan: cyclobenzaprine (FLEXERIL) 10 MG tablet, traMADol (ULTRAM) 50 MG tablet  Anticoagulant long-term use - Plan: apixaban (ELIQUIS) 5 MG TABS tablet  Screening for deficiency anemia - Plan: CBC  Cough - Plan: cefdinir (OMNICEF) 300 MG capsule, albuterol (PROVENTIL HFA;VENTOLIN HFA) 108 (90 Base) MCG/ACT inhaler  Here today with illness for about 3 weeks, with cough and fever.  No fever today but she is on antipyretics.  She does have a history of provoked pulmonary embolism, but is still taking Eliquis so should not have another clot. Chest x-ray is normal as above We will treat with doxycyline and albuterol as needed Also refilled all of her routine medications We plan to visit in 2 or 3 weeks for a physical, she is interested in getting a mammogram and also a Pap  She uses occasional Flexeril and or tramadol for spinal stenosis pain.  She has not requested a refill in some time. She knows not to use  the meds in combination   She plans  to see me for a CPE in a few weeks   Signed Lamar Blinks, MD  Addendum 3/5, received her labs as below  Results for orders placed or performed in visit on 01/31/19  CBC  Result Value Ref Range   WBC 10.2 4.0 - 10.5 K/uL   RBC 4.71 3.87 - 5.11 Mil/uL   Platelets 479.0 (H) 150.0 - 400.0 K/uL   Hemoglobin 12.0 12.0 - 15.0 g/dL   HCT 37.4 36.0 - 46.0 %   MCV 79.4 78.0 - 100.0 fl   MCHC 32.0 30.0 - 36.0 g/dL   RDW 14.8 11.5 - 15.5 %  Comprehensive metabolic panel  Result Value Ref Range   Sodium 137 135 - 145 mEq/L   Potassium 4.2 3.5 - 5.1 mEq/L   Chloride 101 96 - 112 mEq/L   CO2 29 19 - 32 mEq/L   Glucose, Bld 100 (H) 70 - 99 mg/dL   BUN 17 6 - 23 mg/dL   Creatinine, Ser 0.86 0.40 - 1.20 mg/dL   Total Bilirubin 0.3 0.2 - 1.2 mg/dL   Alkaline Phosphatase 123 (H) 39 - 117 U/L   AST 12 0 - 37 U/L   ALT 13 0 - 35 U/L   Total Protein 7.1 6.0 - 8.3 g/dL   Albumin 3.9 3.5 - 5.2 g/dL   Calcium 9.4 8.4 - 10.5 mg/dL   GFR 65.12 >60.00 mL/min  Hemoglobin A1c  Result Value Ref Range   Hgb A1c MFr Bld 6.4 4.6 - 6.5 %  Lipid panel  Result Value Ref Range   Cholesterol 163 0 - 200 mg/dL   Triglycerides 233.0 (H) 0.0 - 149.0 mg/dL   HDL 40.20 >39.00 mg/dL   VLDL 46.6 (H) 0.0 - 40.0 mg/dL   Total CHOL/HDL Ratio 4    NonHDL 122.95   TSH  Result Value Ref Range   TSH 1.75 0.35 - 4.50 uIU/mL  LDL cholesterol, direct  Result Value Ref Range   Direct LDL 105.0 mg/dL  POC Influenza A&B (Binax test)  Result Value Ref Range   Influenza A, POC Negative Negative   Influenza B, POC Negative Negative    Alk phos elevation is new Cholesterol improved from previous due to HDL gains

## 2019-02-01 LAB — COMPREHENSIVE METABOLIC PANEL
ALT: 13 U/L (ref 0–35)
AST: 12 U/L (ref 0–37)
Albumin: 3.9 g/dL (ref 3.5–5.2)
Alkaline Phosphatase: 123 U/L — ABNORMAL HIGH (ref 39–117)
BILIRUBIN TOTAL: 0.3 mg/dL (ref 0.2–1.2)
BUN: 17 mg/dL (ref 6–23)
CO2: 29 mEq/L (ref 19–32)
CREATININE: 0.86 mg/dL (ref 0.40–1.20)
Calcium: 9.4 mg/dL (ref 8.4–10.5)
Chloride: 101 mEq/L (ref 96–112)
GFR: 65.12 mL/min (ref >60.00)
Glucose, Bld: 100 mg/dL — ABNORMAL HIGH (ref 70–99)
Potassium: 4.2 mEq/L (ref 3.5–5.1)
Sodium: 137 mEq/L (ref 135–145)
Total Protein: 7.1 g/dL (ref 6.0–8.3)

## 2019-02-01 LAB — CBC
HCT: 37.4 % (ref 36.0–46.0)
Hemoglobin: 12 g/dL (ref 12.0–15.0)
MCHC: 32 g/dL (ref 30.0–36.0)
MCV: 79.4 fl (ref 78.0–100.0)
PLATELETS: 479 10*3/uL — AB (ref 150.0–400.0)
RBC: 4.71 Mil/uL (ref 3.87–5.11)
RDW: 14.8 % (ref 11.5–15.5)
WBC: 10.2 10*3/uL (ref 4.0–10.5)

## 2019-02-01 LAB — HEMOGLOBIN A1C: Hgb A1c MFr Bld: 6.4 % (ref 4.6–6.5)

## 2019-02-01 LAB — LDL CHOLESTEROL, DIRECT: LDL DIRECT: 105 mg/dL

## 2019-02-01 LAB — LIPID PANEL
Cholesterol: 163 mg/dL (ref 0–200)
HDL: 40.2 mg/dL (ref >39.00)
NonHDL: 122.95
Total CHOL/HDL Ratio: 4
Triglycerides: 233 mg/dL — ABNORMAL HIGH (ref 0.0–149.0)
VLDL: 46.6 mg/dL — ABNORMAL HIGH (ref 0.0–40.0)

## 2019-02-01 LAB — TSH: TSH: 1.75 u[IU]/mL (ref 0.35–4.50)

## 2019-02-01 LAB — POC INFLUENZA A&B (BINAX/QUICKVUE)
INFLUENZA B, POC: NEGATIVE
Influenza A, POC: NEGATIVE

## 2019-02-13 ENCOUNTER — Other Ambulatory Visit: Payer: Self-pay

## 2019-02-13 MED ORDER — ALBUTEROL SULFATE HFA 108 (90 BASE) MCG/ACT IN AERS: 2.0000 | INHALATION_SPRAY | Freq: Four times a day (QID) | RESPIRATORY_TRACT | 5 refills | Status: AC | PRN

## 2019-02-15 ENCOUNTER — Emergency Department (HOSPITAL_BASED_OUTPATIENT_CLINIC_OR_DEPARTMENT_OTHER)
Admission: EM | Admit: 2019-02-15 | Discharge: 2019-02-15 | Disposition: A | Discharge status: Home / Self Care | Payer: Medicare Other | Attending: Emergency Medicine | Admitting: Emergency Medicine

## 2019-02-15 ENCOUNTER — Encounter (HOSPITAL_BASED_OUTPATIENT_CLINIC_OR_DEPARTMENT_OTHER): Payer: Self-pay | Admitting: Adult Health

## 2019-02-15 ENCOUNTER — Ambulatory Visit: Payer: Self-pay

## 2019-02-15 ENCOUNTER — Emergency Department (HOSPITAL_BASED_OUTPATIENT_CLINIC_OR_DEPARTMENT_OTHER): Payer: Medicare Other

## 2019-02-15 ENCOUNTER — Encounter: Payer: Self-pay | Admitting: Family Medicine

## 2019-02-15 ENCOUNTER — Other Ambulatory Visit: Payer: Self-pay

## 2019-02-15 ENCOUNTER — Ambulatory Visit (INDEPENDENT_AMBULATORY_CARE_PROVIDER_SITE_OTHER): Payer: Medicare Other | Admitting: Family Medicine

## 2019-02-15 VITALS — BP 128/90 | HR 84 | Temp 98.1°F | Resp 19 | Ht 64.0 in | Wt 272.0 lb

## 2019-02-15 DIAGNOSIS — Z79899 Other long term (current) drug therapy: Secondary | ICD-10-CM | POA: Diagnosis not present

## 2019-02-15 DIAGNOSIS — E119 Type 2 diabetes mellitus without complications: Secondary | ICD-10-CM | POA: Insufficient documentation

## 2019-02-15 DIAGNOSIS — I5042 Chronic combined systolic (congestive) and diastolic (congestive) heart failure: Secondary | ICD-10-CM | POA: Diagnosis not present

## 2019-02-15 DIAGNOSIS — E039 Hypothyroidism, unspecified: Secondary | ICD-10-CM | POA: Insufficient documentation

## 2019-02-15 DIAGNOSIS — R531 Weakness: Secondary | ICD-10-CM | POA: Diagnosis not present

## 2019-02-15 DIAGNOSIS — R509 Fever, unspecified: Secondary | ICD-10-CM | POA: Diagnosis not present

## 2019-02-15 DIAGNOSIS — R112 Nausea with vomiting, unspecified: Secondary | ICD-10-CM | POA: Insufficient documentation

## 2019-02-15 DIAGNOSIS — R197 Diarrhea, unspecified: Secondary | ICD-10-CM | POA: Diagnosis not present

## 2019-02-15 DIAGNOSIS — Z7901 Long term (current) use of anticoagulants: Secondary | ICD-10-CM | POA: Diagnosis not present

## 2019-02-15 DIAGNOSIS — Z7984 Long term (current) use of oral hypoglycemic drugs: Secondary | ICD-10-CM | POA: Insufficient documentation

## 2019-02-15 DIAGNOSIS — R1110 Vomiting, unspecified: Secondary | ICD-10-CM | POA: Diagnosis present

## 2019-02-15 LAB — CBC WITH DIFFERENTIAL/PLATELET
ABS IMMATURE GRANULOCYTES: 0.03 10*3/uL (ref 0.00–0.07)
Basophils Absolute: 0.2 10*3/uL — ABNORMAL HIGH (ref 0.0–0.1)
Basophils Relative: 1 %
Eosinophils Absolute: 0.6 10*3/uL — ABNORMAL HIGH (ref 0.0–0.5)
Eosinophils Relative: 5 %
HCT: 38.4 % (ref 36.0–46.0)
Hemoglobin: 11.6 g/dL — ABNORMAL LOW (ref 12.0–15.0)
Immature Granulocytes: 0 %
LYMPHS PCT: 20 %
Lymphs Abs: 2.5 10*3/uL (ref 0.7–4.0)
MCH: 24.9 pg — ABNORMAL LOW (ref 26.0–34.0)
MCHC: 30.2 g/dL (ref 30.0–36.0)
MCV: 82.4 fL (ref 80.0–100.0)
MONO ABS: 1.5 10*3/uL — AB (ref 0.1–1.0)
Monocytes Relative: 12 %
Neutro Abs: 8 10*3/uL — ABNORMAL HIGH (ref 1.7–7.7)
Neutrophils Relative %: 62 %
PLATELETS: 460 10*3/uL — AB (ref 150–400)
RBC: 4.66 MIL/uL (ref 3.87–5.11)
RDW: 15 % (ref 11.5–15.5)
WBC: 12.8 10*3/uL — ABNORMAL HIGH (ref 4.0–10.5)
nRBC: 0 % (ref 0.0–0.2)

## 2019-02-15 LAB — COMPREHENSIVE METABOLIC PANEL
ALBUMIN: 3.5 g/dL (ref 3.5–5.0)
ALT: 15 U/L (ref 0–44)
AST: 17 U/L (ref 15–41)
Alkaline Phosphatase: 118 U/L (ref 38–126)
Anion gap: 9 (ref 5–15)
BUN: 16 mg/dL (ref 8–23)
CO2: 25 mmol/L (ref 22–32)
CREATININE: 0.84 mg/dL (ref 0.44–1.00)
Calcium: 9.1 mg/dL (ref 8.9–10.3)
Chloride: 102 mmol/L (ref 98–111)
GFR calc Af Amer: >60 mL/min (ref >60)
GFR calc non Af Amer: >60 mL/min (ref >60)
Glucose, Bld: 119 mg/dL — ABNORMAL HIGH (ref 70–99)
Potassium: 3.7 mmol/L (ref 3.5–5.1)
Sodium: 136 mmol/L (ref 135–145)
Total Bilirubin: 0.3 mg/dL (ref 0.3–1.2)
Total Protein: 7.4 g/dL (ref 6.5–8.1)

## 2019-02-15 LAB — LIPASE, BLOOD: Lipase: 27 U/L (ref 11–51)

## 2019-02-15 MED ORDER — SODIUM CHLORIDE 0.9 % IV BOLUS
1000.0000 mL | Freq: Once | INTRAVENOUS | Status: AC
  Administered 2019-02-15: 1000 mL via INTRAVENOUS

## 2019-02-15 MED ORDER — ONDANSETRON 4 MG PO TBDP: ORAL_TABLET | ORAL | 0 refills | Status: DC

## 2019-02-15 NOTE — Telephone Encounter (Signed)
Just an FYI

## 2019-02-15 NOTE — ED Triage Notes (Signed)
Presents with cough, SOB, n/v/d and fatigue that began on Thursday. She states, "I have been so tired and fatigued and that is not like me. Everything just wears me out. I would do what the doctor upstairs want me to dai but all of you should know that I refuse to go into the hospital"  She reports fever of 103 at home.

## 2019-02-15 NOTE — Telephone Encounter (Signed)
Pt called to states that she has been sick and taking Doxycycline for bronchitis.  She called to say she has developed diarrhea and is dizzy. She is working today.  She states that she has had 2 loose to watery stools today. She is thirsty. She has no fever today. She saw blood last night after her BM.  Per protocol office was contacted. Ok per [place] to schedule appointment. Appointment scheduled per protocol. Care advice read to patient. Patient verbalized understanding of all instructions.  Reason for Disposition . [1] Drinking very little AND [2] dehydration suspected (e.g., no urine > 12 hours, very dry mouth, very lightheaded)  Answer Assessment - Initial Assessment Questions 1. DESCRIPTION: "Describe your dizziness."     Very lighheaded 2. LIGHTHEADED: "Do you feel lightheaded?" (e.g., somewhat faint, woozy, weak upon standing)     lightheaded 3. VERTIGO: "Do you feel like either you or the room is spinning or tilting?" (i.e. vertigo)     Light headed 4. SEVERITY: "How bad is it?"  "Do you feel like you are going to faint?" "Can you stand and walk?"   - MILD - walking normally   - MODERATE - interferes with normal activities (e.g., work, school)    - SEVERE - unable to stand, requires support to walk, feels like passing out now.      Uses cane of other device to walk 5. ONSET:  "When did the dizziness begin?"     Last few days with temp spike at 103 6. AGGRAVATING FACTORS: "Does anything make it worse?" (e.g., standing, change in head position)     Yes both 7. HEART RATE: "Can you tell me your heart rate?" "How many beats in 15 seconds?"  (Note: not all patients can do this)      no 8. CAUSE: "What do you think is causing the dizziness?"    Been sick for 3 weeks 9. RECURRENT SYMPTOM: "Have you had dizziness before?" If so, ask: "When was the last time?" "What happened that time?"     no 10. OTHER SYMPTOMS: "Do you have any other symptoms?" (e.g., fever, chest pain, vomiting,  diarrhea, bleeding)      Diarrhea, 11. PREGNANCY: "Is there any chance you are pregnant?" "When was your last menstrual period?"       N/A  Answer Assessment - Initial Assessment Questions 1. DIARRHEA SEVERITY: "How bad is the diarrhea?" "How many extra stools have you had in the past 24 hours than normal?"    - NO DIARRHEA (SCALE 0)   - MILD (SCALE 1-3): Few loose or mushy BMs; increase of 1-3 stools over normal daily number of stools; mild increase in ostomy output.   -  MODERATE (SCALE 4-7): Increase of 4-6 stools daily over normal; moderate increase in ostomy output. * SEVERE (SCALE 8-10; OR 'WORST POSSIBLE'): Increase of 7 or more stools daily over normal; moderate increase in ostomy output; incontinence.     Diarrhea since last Thursday 2. ONSET: "When did the diarrhea begin?"      Last Thursday 3. BM CONSISTENCY: "How loose or watery is the diarrhea?"      both 4. VOMITING: "Are you also vomiting?" If so, ask: "How many times in the past 24 hours?"      no 5. ABDOMINAL PAIN: "Are you having any abdominal pain?" If yes: "What does it feel like?" (e.g., crampy, dull, intermittent, constant)      unsure 6. ABDOMINAL PAIN SEVERITY: If present, ask: "How bad is the  pain?"  (e.g., Scale 1-10; mild, moderate, or severe)   - MILD (1-3): doesn't interfere with normal activities, abdomen soft and not tender to touch    - MODERATE (4-7): interferes with normal activities or awakens from sleep, tender to touch    - SEVERE (8-10): excruciating pain, doubled over, unable to do any normal activities       Stomach ache 6 7. ORAL INTAKE: If vomiting, "Have you been able to drink liquids?" "How much fluids have you had in the past 24 hours?"     Constant sipping on sweet tea 8. HYDRATION: "Any signs of dehydration?" (e.g., dry mouth [not just dry lips], too weak to stand, dizziness, new weight loss) "When did you last urinate?"     Dry mouth, dizziness 9. EXPOSURE: "Have you traveled to a foreign  country recently?" "Have you been exposed to anyone with diarrhea?" "Could you have eaten any food that was spoiled?"    no 10. ANTIBIOTIC USE: "Are you taking antibiotics now or have you taken antibiotics in the past 2 months?"       Doxycycline 11. OTHER SYMPTOMS: "Do you have any other symptoms?" (e.g., fever, blood in stool)       no 12. PREGNANCY: "Is there any chance you are pregnant?" "When was your last menstrual period?"      No  Protocols used: DIARRHEA-A-AH, DIZZINESS Heidi Dach

## 2019-02-15 NOTE — ED Provider Notes (Signed)
[facility] Provider Note   CSN: [id number] Arrival date & time: 02/15/19  1642    History   Chief Complaint Chief Complaint  Patient presents with  . Cough  . Emesis    HPI Kathleen Miller is a 73 y.o. female.     Patient is a 73 year old female with a history of diabetes, hypothyroid and chronic back pain who presents with vomiting and diarrhea.  She was seen by her PCP on March 4.  She had bronchitis symptoms at that time and was started on doxycycline.  She states overall her cough and shortness of breath have improved slightly.  However 1 week ago she started running fevers again with a T-max of 103.  She has not had a fever in the last 4 to 5 days but she is having some ongoing nausea vomiting diarrhea.  She has watery nonbloody diarrhea about 3-5 times a day and intermittent vomiting.  She denies any abdominal pain.  She feels very fatigued and gets exhausted with minimal exertion.  She denies any recent travel outside the state or known exposure to a COVID positive person.     Past Medical History:  Diagnosis Date  . Chronic back pain   . Hypothyroidism   . Pneumonia   . Pre-diabetes    takes metformin preventatively  . Ventral hernia     Patient Active Problem List   Diagnosis Date Noted  . Acute bronchitis 04/24/2018  . Pressure injury of skin 12/20/2017  . Pulmonary embolism ([place]) 12/17/2017  . Leukocytosis 12/17/2017  . Hyponatremia 12/17/2017  . ARF (acute renal failure) ([place]) 12/17/2017  . Incisional hernia 12/13/2017  . Chest pain 08/05/2017  . Abnormal nuclear stress test 08/05/2017  . Low bone mass 02/23/2017  . Chronic pain syndrome 07/18/2016  . Dyslipidemia 07/12/2016  . LUQ pain 05/27/2016  . GERD (gastroesophageal reflux disease) 05/27/2016  . Encounter for screening mammogram for breast cancer 11/26/2014  . Slow transit constipation 06/06/2014  . Diabetes ([place]) 12/04/2013  . Chronic combined systolic and  diastolic heart failure ([place]) 12/04/2013  . Essential hypertension, benign 12/04/2013  . Spinal stenosis of lumbar region 12/04/2013  . Morbid obesity ([place]) 08/04/2013  . Acute combined systolic and diastolic congestive heart failure ([place]) 08/02/2013  . Type II or unspecified type diabetes mellitus without mention of complication, uncontrolled 08/02/2013    Past Surgical History:  Procedure Laterality Date  . ABDOMINAL HYSTERECTOMY  11/2011  . CHOLECYSTECTOMY  1988  . COLONOSCOPY WITH PROPOFOL N/A 05/27/2016   Procedure: COLONOSCOPY WITH PROPOFOL;  Surgeon: Wilford Corner, MD;  Location: [facility];  Service: Endoscopy;  Laterality: N/A;  . DILATION AND CURETTAGE OF UTERUS     x2  . ESOPHAGOGASTRODUODENOSCOPY (EGD) WITH PROPOFOL N/A 05/27/2016   Procedure: ESOPHAGOGASTRODUODENOSCOPY (EGD) WITH PROPOFOL;  Surgeon: Wilford Corner, MD;  Location: [facility];  Service: Endoscopy;  Laterality: N/A;  . GASTRIC BYPASS    . GASTRIC ROUX-EN-Y N/A 12/13/2017   Procedure: LAPAROSCOPIC ASSTED VENTRAL HERNIA REPAIR, Upper Endo;  Surgeon: Excell Seltzer, MD;  Location: [facility];  Service: General;  Laterality: N/A;  With MESH  . LEFT HEART CATH AND CORONARY ANGIOGRAPHY N/A 08/05/2017   Procedure: LEFT HEART CATH AND CORONARY ANGIOGRAPHY;  Surgeon: Martinique, Peter M, MD;  Location: [facility];  Service: Cardiovascular;  Laterality: N/A;  . revision gastric bypass     and ventrel hernia repair  Dr. Excell Seltzer 12-13-17  . SPLENECTOMY, TOTAL  1988  OB History   No obstetric history on file.      Home Medications    Prior to Admission medications   Medication Sig Start Date End Date Taking? Authorizing Provider  albuterol (PROAIR HFA) 108 (90 Base) MCG/ACT inhaler Inhale 2 puffs into the lungs every 6 (six) hours as needed for wheezing or shortness of breath. 02/13/19   Colon Branch, MD  apixaban (ELIQUIS) 5 MG TABS tablet Take 1 tablet (5 mg total) by mouth 2 (two) times daily. 01/31/19    Copland, Gay Filler, MD  cyclobenzaprine (FLEXERIL) 10 MG tablet Take 0.5-1 tablets (5-10 mg total) by mouth 3 (three) times daily as needed for muscle spasms. 01/31/19   Copland, Gay Filler, MD  furosemide (LASIX) 20 MG tablet TAKE 2 TABLETS BY MOUTH ONCE DAILY. ADJUST DOSE AS RECOMMENDED BY MD 06/26/18   Copland, Gay Filler, MD  lovastatin (MEVACOR) 20 MG tablet Take 1 tablet (20 mg total) by mouth at bedtime. 01/31/19   Copland, Gay Filler, MD  metFORMIN (GLUCOPHAGE) 500 MG tablet Take 1 tablet (500 mg total) by mouth 2 (two) times daily with a meal. 01/31/19   Copland, Gay Filler, MD  ondansetron (ZOFRAN ODT) 4 MG disintegrating tablet 4mg  ODT q4 hours prn nausea/vomit 02/15/19   Malvin Johns, MD  thyroid (NP THYROID) 60 MG tablet Take 1 tablet (60 mg total) by mouth daily before breakfast. 01/31/19   Copland, Gay Filler, MD  traMADol (ULTRAM) 50 MG tablet Take 1 tablet (50 mg total) by mouth every 6 (six) hours as needed for moderate pain. 01/31/19   Copland, Gay Filler, MD    Family History History reviewed. No pertinent family history.  Social History Social History   Tobacco Use  . Smoking status: Never Smoker  . Smokeless tobacco: Never Used  Substance Use Topics  . Alcohol use: No  . Drug use: No     Allergies   Nsaids and Tolmetin   Review of Systems Review of Systems  Constitutional: Positive for fatigue. Negative for chills, diaphoresis and fever.  HENT: Positive for congestion. Negative for rhinorrhea and sneezing.   Eyes: Negative.   Respiratory: Positive for cough and shortness of breath. Negative for chest tightness.   Cardiovascular: Negative for chest pain and leg swelling.  Gastrointestinal: Positive for diarrhea, nausea and vomiting. Negative for abdominal pain and blood in stool.  Genitourinary: Negative for difficulty urinating, flank pain, frequency and hematuria.  Musculoskeletal: Negative for arthralgias and back pain.  Skin: Negative for rash.  Neurological: Negative  for dizziness, speech difficulty, weakness, numbness and headaches.     Physical Exam Updated Vital Signs BP 140/66 (BP Location: Right Arm)   Pulse 83   Temp 98.3 F (36.8 C) (Oral)   Resp 20   Ht 5\' 4"  (1.626 m)   Wt 123.3 kg   SpO2 96%   BMI 46.66 kg/m   Physical Exam Constitutional:      Appearance: She is well-developed.  HENT:     Head: Normocephalic and atraumatic.     Mouth/Throat:     Mouth: Mucous membranes are dry.  Eyes:     Pupils: Pupils are equal, round, and reactive to light.  Neck:     Musculoskeletal: Normal range of motion and neck supple.  Cardiovascular:     Rate and Rhythm: Normal rate and regular rhythm.     Heart sounds: Normal heart sounds.  Pulmonary:     Effort: Pulmonary effort is normal. No respiratory distress.  Breath sounds: Normal breath sounds. No wheezing or rales.  Chest:     Chest wall: No tenderness.  Abdominal:     General: Bowel sounds are normal.     Palpations: Abdomen is soft.     Tenderness: There is no abdominal tenderness. There is no guarding or rebound.  Musculoskeletal: Normal range of motion.     Right lower leg: Edema present.     Left lower leg: Edema present.  Lymphadenopathy:     Cervical: No cervical adenopathy.  Skin:    General: Skin is warm and dry.     Findings: No rash.  Neurological:     Mental Status: She is alert and oriented to person, place, and time.      ED Treatments / Results  Labs (all labs ordered are listed, but only abnormal results are displayed) Labs Reviewed  COMPREHENSIVE METABOLIC PANEL - Abnormal; Notable for the following components:      Result Value   Glucose, Bld 119 (*)    All other components within normal limits  CBC WITH DIFFERENTIAL/PLATELET - Abnormal; Notable for the following components:   WBC 12.8 (*)    Hemoglobin 11.6 (*)    MCH 24.9 (*)    Platelets 460 (*)    Neutro Abs 8.0 (*)    Monocytes Absolute 1.5 (*)    Eosinophils Absolute 0.6 (*)     Basophils Absolute 0.2 (*)    All other components within normal limits  LIPASE, BLOOD    EKG None  Radiology Dg Chest 2 View  Result Date: 02/15/2019 CLINICAL DATA:  Vomiting cough EXAM: CHEST - 2 VIEW COMPARISON:  01/31/2019 FINDINGS: The heart size and mediastinal contours are within normal limits. Both lungs are clear. The visualized skeletal structures are unremarkable. IMPRESSION: No active cardiopulmonary disease. Electronically Signed   By: Franchot Gallo M.D.   On: 02/15/2019 18:41    Procedures Procedures (including critical care time)  Medications Ordered in ED Medications  sodium chloride 0.9 % bolus 1,000 mL ( Intravenous Stopped 02/15/19 1841)     Initial Impression / Assessment and Plan / ED Course  I have reviewed the triage vital signs and the nursing notes.  Pertinent labs & imaging results that were available during my care of the patient were reviewed by me and considered in my medical decision making (see chart for details).        Patient is a 72 year old female who presents with nausea vomiting and diarrhea.  This started after recent viral respiratory illness.  She states that her cough and respiratory symptoms are slowly improving but now she feels dehydrated from the vomiting and diarrhea.  She feels much better after hydration in the ED.  Her WBC count is elevated but she does not have any suggestions of a bacterial infection.  Her abdomen is nontender.  Her chest x-ray is clear without evidence of pneumonia.  Her electrolytes and other blood work are non-concerning.  She was given IV fluids and feels much better.  She has not had any vomiting today and has been tolerating oral fluids.  She is fairly adamant about being discharged home.  She was given a prescription for Zofran.  She was encouraged to have follow-up with her PCP if her symptoms or not improving.  I did attempt to collect a stool sample for testing, particularly since she has been on recent  antibiotics but she was not able to produce a sample.  I advised her if the diarrhea  continues, she can follow-up with her PCP for stool testing.  She was given strict return precautions.  Final Clinical Impressions(s) / ED Diagnoses   Final diagnoses:  Nausea vomiting and diarrhea    ED Discharge Orders         Ordered    ondansetron (ZOFRAN ODT) 4 MG disintegrating tablet     02/15/19 1906           Malvin Johns, MD 02/15/19 1911

## 2019-02-15 NOTE — Progress Notes (Signed)
[facility] at [facility] [address], [place], [place] [address] [phone]  Date:  02/15/2019   Name:  Kathleen Miller   DOB:  [birth year]   MRN:  [id number]  PCP:  Darreld Mclean, MD    Chief Complaint: Dizziness (given doxy for bronchitis, diarrhea, blood in stool yesterday, no fever) and Fever (103 fever, cough, shortness of breath, dizziness, lightheaded)   History of Present Illness:  Kathleen Miller is a 73 y.o. very pleasant female patient who presents with the following:  Seen by myself earlier this month.  History of diabetes, hypertension, obesity, spinal stenosis.  She did have a pulmonary embolism last year following a hernia repair.  She is on Eliquis  Most recent echocardiogram in January 2019 showed normal systolic function, grade 1 diastolic dysfunction  Seen by myself on 3/4- with cough, treated with doxycycline for 10 days  She notes that she is still coughing She is also feeling SOB- this is getting worse  She also a temp of 103 one week ago, ever since then she has noted nausea, felt lightheaded.   Her last fever was this past Sunday we think- however her thermometer died that day so she has not been checking her temp  She notes that she is not vomiting She is having diarrhea 3-5x a day for the last 5 days or so   She has traveled in [place] but not out of state in the last 2 weeks No known corona + contacts   States that she is lightheaded, feels like she is looking out of a tunnel     Patient Active Problem List   Diagnosis Date Noted  . Acute bronchitis 04/24/2018  . Pressure injury of skin 12/20/2017  . Pulmonary embolism ([place]) 12/17/2017  . Leukocytosis 12/17/2017  . Hyponatremia 12/17/2017  . ARF (acute renal failure) ([place]) 12/17/2017  . Incisional hernia 12/13/2017  . Chest pain 08/05/2017  . Abnormal nuclear stress test 08/05/2017  . Low bone mass 02/23/2017  . Chronic pain syndrome  07/18/2016  . Dyslipidemia 07/12/2016  . LUQ pain 05/27/2016  . GERD (gastroesophageal reflux disease) 05/27/2016  . Encounter for screening mammogram for breast cancer 11/26/2014  . Slow transit constipation 06/06/2014  . Diabetes ([place]) 12/04/2013  . Chronic combined systolic and diastolic heart failure ([place]) 12/04/2013  . Essential hypertension, benign 12/04/2013  . Spinal stenosis of lumbar region 12/04/2013  . Morbid obesity ([place]) 08/04/2013  . Acute combined systolic and diastolic congestive heart failure ([place]) 08/02/2013  . Type II or unspecified type diabetes mellitus without mention of complication, uncontrolled 08/02/2013    Past Medical History:  Diagnosis Date  . Chronic back pain   . Hypothyroidism   . Pneumonia   . Pre-diabetes    takes metformin preventatively  . Ventral hernia     Past Surgical History:  Procedure Laterality Date  . ABDOMINAL HYSTERECTOMY  11/2011  . CHOLECYSTECTOMY  1988  . COLONOSCOPY WITH PROPOFOL N/A 05/27/2016   Procedure: COLONOSCOPY WITH PROPOFOL;  Surgeon: Wilford Corner, MD;  Location: [facility];  Service: Endoscopy;  Laterality: N/A;  . DILATION AND CURETTAGE OF UTERUS     x2  . ESOPHAGOGASTRODUODENOSCOPY (EGD) WITH PROPOFOL N/A 05/27/2016   Procedure: ESOPHAGOGASTRODUODENOSCOPY (EGD) WITH PROPOFOL;  Surgeon: Wilford Corner, MD;  [facility]: [facility];  Service: Endoscopy;  Laterality: N/A;  . GASTRIC BYPASS    . GASTRIC ROUX-EN-Y N/A 12/13/2017   Procedure: LAPAROSCOPIC ASSTED  VENTRAL HERNIA REPAIR, Upper Endo;  Surgeon: Excell Seltzer, MD;  Location: [facility];  Service: General;  Laterality: N/A;  With MESH  . LEFT HEART CATH AND CORONARY ANGIOGRAPHY N/A 08/05/2017   Procedure: LEFT HEART CATH AND CORONARY ANGIOGRAPHY;  Surgeon: Martinique, Peter M, MD;  Location: [facility];  Service: Cardiovascular;  Laterality: N/A;  . revision gastric bypass     and ventrel hernia repair  Dr. Excell Seltzer 12-13-17  . SPLENECTOMY, TOTAL  1988     Social History   Tobacco Use  . Smoking status: Never Smoker  . Smokeless tobacco: Never Used  Substance Use Topics  . Alcohol use: No  . Drug use: No    No family history on file.  Allergies  Allergen Reactions  . Nsaids Swelling and Other (See Comments)    Severe swelling--water retention (including water on brain)  . Tolmetin Swelling and Other (See Comments)    Severe swelling-water retention (including water on brain)     Medication list has been reviewed and updated.  Current Outpatient Medications on File Prior to Visit  Medication Sig Dispense Refill  . albuterol (PROAIR HFA) 108 (90 Base) MCG/ACT inhaler Inhale 2 puffs into the lungs every 6 (six) hours as needed for wheezing or shortness of breath. 18 g 5  . apixaban (ELIQUIS) 5 MG TABS tablet Take 1 tablet (5 mg total) by mouth 2 (two) times daily. 180 tablet 3  . cyclobenzaprine (FLEXERIL) 10 MG tablet Take 0.5-1 tablets (5-10 mg total) by mouth 3 (three) times daily as needed for muscle spasms. 60 tablet 0  . furosemide (LASIX) 20 MG tablet TAKE 2 TABLETS BY MOUTH ONCE DAILY. ADJUST DOSE AS RECOMMENDED BY MD 180 tablet 1  . lovastatin (MEVACOR) 20 MG tablet Take 1 tablet (20 mg total) by mouth at bedtime. 90 tablet 3  . metFORMIN (GLUCOPHAGE) 500 MG tablet Take 1 tablet (500 mg total) by mouth 2 (two) times daily with a meal. 180 tablet 3  . thyroid (NP THYROID) 60 MG tablet Take 1 tablet (60 mg total) by mouth daily before breakfast. 90 tablet 3  . traMADol (ULTRAM) 50 MG tablet Take 1 tablet (50 mg total) by mouth every 6 (six) hours as needed for moderate pain. 30 tablet 0   No current facility-administered medications on file prior to visit.     Review of Systems:  As per HPI- otherwise negative.   Physical Examination: Vitals:   02/15/19 1607  BP: 128/90  Pulse: 84  Resp: 19  Temp: 98.1 F (36.7 C)  SpO2: 97%   Vitals:   02/15/19 1607  Weight: 272 lb (123.4 kg)  Height: 5\' 4"  (1.626 m)    Body mass index is 46.69 kg/m. Ideal Body Weight: Weight in (lb) to have BMI = 25: 145.3  GEN: WDWN, NAD, Non-toxic, A & O x 3, obese, appears her normal self but more subdued  HEENT: Atraumatic, Normocephalic. Neck supple. No masses, No LAD. Ears and Nose: No external deformity. CV: RRR, No M/G/R. No JVD. No thrill. No extra heart sounds. PULM: CTA B, no wheezes, crackles, rhonchi. No retractions. No resp. distress. No accessory muscle use. ABD: S, NT, ND, +BS. No rebund. No HSM. EXTR: No c/c/e NEURO Normal gait.  PSYCH: Normally interactive. Conversant. Not depressed or anxious appearing.  Calm demeanor.    Assessment and Plan: Weakness  Diarrhea, unspecified type  Fever, unspecified  Seen here 2 weeks ago and treated for bronchitis with doxycycline Here today  with diarrhea, weakness, lightheaded, fever to 103 a week ago, cough I feel that she needs evaluation and treatment that we cannot provide here at outpt clinic, pt will be seen in the ER.  Transported to ER in [facility] per staff   Signed Lamar Blinks, MD

## 2019-02-15 NOTE — Patient Instructions (Signed)
Please proceed to the ER for further evaluation

## 2019-02-16 ENCOUNTER — Telehealth: Payer: Self-pay | Admitting: Family Medicine

## 2019-02-16 NOTE — Telephone Encounter (Signed)
Patient was seen in the ER yesterday for vomiting and diarrhea I treated her on March 4 with doxycycline for bronchitis Chest x-ray in the ER was clear I do not think more antibiotics are indicated at this time, and may be harmful  Called her but did not reach, left detailed message.  At this time I do not think antibiotics would be helpful, they may cause her GI symptoms to worsen.  Please let me know if she does not continue to improve, or if any other questions.

## 2019-02-16 NOTE — Telephone Encounter (Signed)
Copied from [place] [id number]. Topic: General - Other >> Feb 16, 2019  9:59 AM Lennox Solders wrote: Reason for CRM: pt saw dr copland 02-15-2019. Pt is calling and would like a refill on doxycycline. [facility] [address]. Pt has an appt with dr copland on 02-19-2019

## 2019-02-19 ENCOUNTER — Ambulatory Visit (INDEPENDENT_AMBULATORY_CARE_PROVIDER_SITE_OTHER): Payer: Medicare Other | Admitting: Family Medicine

## 2019-02-19 ENCOUNTER — Other Ambulatory Visit: Payer: Self-pay

## 2019-02-19 ENCOUNTER — Encounter: Payer: Self-pay | Admitting: Family Medicine

## 2019-02-19 DIAGNOSIS — R197 Diarrhea, unspecified: Secondary | ICD-10-CM

## 2019-02-19 NOTE — Progress Notes (Signed)
[place] at [facility] [address], [place], [place] [address] [phone]  Date:  02/19/2019   Name:  Kathleen Miller   DOB:  01/21/1946   MRN:  [id number]  PCP:  Darreld Mclean, MD    Chief Complaint: No chief complaint on file.   History of Present Illness:  Virtual Visit via Telephone Note COVID 19 crisis  I connected with Kathleen Miller on 02/19/19 at  3:40 PM EDT by telephone and verified that I am speaking with the correct person using two identifiers.   I discussed the limitations, risks, security and privacy concerns of performing an evaluation and management service by telephone and the availability of in person appointments. I also discussed with the patient that there may be a patient responsible charge related to this service. The patient expressed understanding and agreed to proceed.  Kathleen Miller is a 73 y.o. very pleasant female patient who presents with the following: History of PE after a ventral hernia repair, on eliquis She also has DM, dyslipidemia, hypothyroidism, chronic spine pain due to spinal stenosis.  Seen here on 3/4 when I gave her a course of doxycycline due to cough for 3 weeks Then returned to the office on 3/19 with concern of weakness-  She was seen in the ER on 3/19 after being seen in the office as she had complaint of vomiting and diarrhea with weakness.  They hydrated her and she felt better. CXR was clear.  She was given zofran and released to home BMP was fine then, mild increase in her WBC count  She reports that she still has cough, it has gotten better but not gone yet  Diarrhea is also still present- she may go just twice a day She has not tried imodium as of yet She is using zofran for nausea  After she eats she feels ill-  if she goes and has a BM she will avoid vomiting, but if she does not allow herself to have a stool she might vomit She is not checking her temp-she  does not have a thermometer and they are all sold out at the stores.    She does overall feel like she is going in the right direction and getting better She needs a note for work for the rest of this week- she will pick this up tomorrow and also get a kit for C diff testing  She also needs the note to say that she can wear a mask when she returns to work - this is fine with me   Dg Chest 2 View  Result Date: 02/15/2019 CLINICAL DATA:  Vomiting cough EXAM: CHEST - 2 VIEW COMPARISON:  01/31/2019 FINDINGS: The heart size and mediastinal contours are within normal limits. Both lungs are clear. The visualized skeletal structures are unremarkable. IMPRESSION: No active cardiopulmonary disease. Electronically Signed   By: Franchot Gallo M.D.   On: 02/15/2019 18:41   Dg Chest 2 View  Result Date: 01/31/2019 CLINICAL DATA:  Fever and shortness of breath for 3 weeks. EXAM: CHEST - 2 VIEW COMPARISON:  . 07/24/2018 and prior radiographs FINDINGS: The cardiomediastinal silhouette is unremarkable. Elevation of the RIGHT hemidiaphragm and mild interstitial prominence again noted. There is no evidence of focal airspace disease, pulmonary edema, suspicious pulmonary nodule/mass, pleural effusion, or pneumothorax. No acute bony abnormalities are identified. IMPRESSION: No acute abnormality. Electronically Signed   By: Cleatis Polka.D.  On: 01/31/2019 17:04     Patient Active Problem List   Diagnosis Date Noted  . Acute bronchitis 04/24/2018  . Pressure injury of skin 12/20/2017  . Pulmonary embolism ([place]) 12/17/2017  . Leukocytosis 12/17/2017  . Hyponatremia 12/17/2017  . ARF (acute renal failure) ([place]) 12/17/2017  . Incisional hernia 12/13/2017  . Chest pain 08/05/2017  . Abnormal nuclear stress test 08/05/2017  . Low bone mass 02/23/2017  . Chronic pain syndrome 07/18/2016  . Dyslipidemia 07/12/2016  . LUQ pain 05/27/2016  . GERD (gastroesophageal reflux disease) 05/27/2016  . Encounter for  screening mammogram for breast cancer 11/26/2014  . Slow transit constipation 06/06/2014  . Diabetes ([place]) 12/04/2013  . Chronic combined systolic and diastolic heart failure ([place]) 12/04/2013  . Essential hypertension, benign 12/04/2013  . Spinal stenosis of lumbar region 12/04/2013  . Morbid obesity ([place]) 08/04/2013  . Acute combined systolic and diastolic congestive heart failure ([place]) 08/02/2013  . Type II or unspecified type diabetes mellitus without mention of complication, uncontrolled 08/02/2013    Past Medical History:  Diagnosis Date  . Chronic back pain   . Hypothyroidism   . Pneumonia   . Pre-diabetes    takes metformin preventatively  . Ventral hernia     Past Surgical History:  Procedure Laterality Date  . ABDOMINAL HYSTERECTOMY  11/2011  . CHOLECYSTECTOMY  1988  . COLONOSCOPY WITH PROPOFOL N/A 05/27/2016   Procedure: COLONOSCOPY WITH PROPOFOL;  Surgeon: Wilford Corner, MD;  Location: [facility];  Service: Endoscopy;  Laterality: N/A;  . DILATION AND CURETTAGE OF UTERUS     x2  . ESOPHAGOGASTRODUODENOSCOPY (EGD) WITH PROPOFOL N/A 05/27/2016   Procedure: ESOPHAGOGASTRODUODENOSCOPY (EGD) WITH PROPOFOL;  Surgeon: Wilford Corner, MD;  [facility];  Service: Endoscopy;  Laterality: N/A;  . GASTRIC BYPASS    . GASTRIC ROUX-EN-Y N/A 12/13/2017   Procedure: LAPAROSCOPIC ASSTED VENTRAL HERNIA REPAIR, Upper Endo;  Surgeon: Excell Seltzer, MD;  Location: [facility];  Service: General;  Laterality: N/A;  With MESH  . LEFT HEART CATH AND CORONARY ANGIOGRAPHY N/A 08/05/2017   Procedure: LEFT HEART CATH AND CORONARY ANGIOGRAPHY;  Surgeon: Martinique, Peter M, MD;  Location: [facility];  Service: Cardiovascular;  Laterality: N/A;  . revision gastric bypass     and ventrel hernia repair  Dr. Excell Seltzer 12-13-17  . SPLENECTOMY, TOTAL  1988    Social History   Tobacco Use  . Smoking status: Never Smoker  . Smokeless tobacco: Never Used  Substance Use Topics  .  Alcohol use: No  . Drug use: No    No family history on file.  Allergies  Allergen Reactions  . Nsaids Swelling and Other (See Comments)    Severe swelling--water retention (including water on brain)  . Tolmetin Swelling and Other (See Comments)    Severe swelling-water retention (including water on brain)     Medication list has been reviewed and updated.  Current Outpatient Medications on File Prior to Visit  Medication Sig Dispense Refill  . albuterol (PROAIR HFA) 108 (90 Base) MCG/ACT inhaler Inhale 2 puffs into the lungs every 6 (six) hours as needed for wheezing or shortness of breath. 18 g 5  . apixaban (ELIQUIS) 5 MG TABS tablet Take 1 tablet (5 mg total) by mouth 2 (two) times daily. 180 tablet 3  . cyclobenzaprine (FLEXERIL) 10 MG tablet Take 0.5-1 tablets (5-10 mg total) by mouth 3 (three) times daily as needed for muscle spasms. 60 tablet 0  . furosemide (LASIX) 20 MG  tablet TAKE 2 TABLETS BY MOUTH ONCE DAILY. ADJUST DOSE AS RECOMMENDED BY MD 180 tablet 1  . lovastatin (MEVACOR) 20 MG tablet Take 1 tablet (20 mg total) by mouth at bedtime. 90 tablet 3  . metFORMIN (GLUCOPHAGE) 500 MG tablet Take 1 tablet (500 mg total) by mouth 2 (two) times daily with a meal. 180 tablet 3  . ondansetron (ZOFRAN ODT) 4 MG disintegrating tablet 78m ODT q4 hours prn nausea/vomit 8 tablet 0  . thyroid (NP THYROID) 60 MG tablet Take 1 tablet (60 mg total) by mouth daily before breakfast. 90 tablet 3  . traMADol (ULTRAM) 50 MG tablet Take 1 tablet (50 mg total) by mouth every 6 (six) hours as needed for moderate pain. 30 tablet 0   No current facility-administered medications on file prior to visit.     Review of Systems:  As per HPI- otherwise negative. No fever measured  Physical Examination: There were no vitals filed for this visit. There were no vitals filed for this visit. There is no height or weight on file to calculate BMI. Ideal Body Weight:    She is not taking her vitals  at home She sounds well on the phone. No cough or wheezing   Assessment and Plan: Diarrhea, unspecified type - Plan: Clostridium Difficile by PCR([facility])  Following up on recent illness Cough is getting better.  I again explained why I would not recommend additional abx as she has diarrhea.  She will come in to get C diff kit tomorrow Overall she is getting better  She will rest, take a note to be OOW until next week She will let me know if any worsening or other concerns in the meantime     I provided 10 minutes of non-face-to-face time during this encounter.   JLamar Blinks MD

## 2019-02-20 ENCOUNTER — Other Ambulatory Visit: Payer: Medicare Other

## 2019-02-20 DIAGNOSIS — R197 Diarrhea, unspecified: Secondary | ICD-10-CM

## 2019-02-21 ENCOUNTER — Ambulatory Visit: Payer: Medicare Other | Admitting: Family Medicine

## 2019-02-21 LAB — C. DIFFICILE GDH AND TOXIN A/B
GDH ANTIGEN: NOT DETECTED
MICRO NUMBER:: 349087
SPECIMEN QUALITY: ADEQUATE
TOXIN A AND B: NOT DETECTED

## 2019-08-09 ENCOUNTER — Other Ambulatory Visit: Payer: Self-pay

## 2019-08-09 NOTE — Patient Outreach (Signed)
[facility]) Care Management  08/09/2019  Kathleen Miller 08-23-1946 [id number]   Medication Adherence call to Kathleen Miller spoke with patient she did not want to engage patient will call back if she needs to. Kathleen Miller is showing past due on Metformin 500 mg and Lovastatin 20 mg under [name].   [place] Management Direct Dial [phone]  Fax [phone] [email or web address]

## 2019-08-21 NOTE — Patient Instructions (Addendum)
It was very nice to see you again today, I will be in touch with your labs ASAP. You might want to have the shingles vaccine given at your drugstore.  They can also give you a tetanus booster  Please go to the x-ray dept on the ground floor for a chest film. I will be in touch with your results asap Your EKG looks fine I think the pain in your chest is likely MSK in origin; we will make sure all looks normal on your rib films today  Your got your flu shot today We will set you up for a mammogram asap

## 2019-08-21 NOTE — Progress Notes (Addendum)
[place] at [facility] [address], [place], [place] [address] [phone]  Date:  08/23/2019   Name:  Kathleen Miller   DOB:  Jul 06, 1946   MRN:  [id number]  PCP:  Darreld Mclean, MD    Chief Complaint: Gynecologic Exam, Flu Vaccine, Trouble walking (since surgery, trouble walking, pain in legs), Shortness of Breath (edema, headache, pain under left breast), Urinary Incontinence, and Weight Loss   History of Present Illness:  Kathleen Miller is a 73 y.o. very pleasant female patient who presents with the following:  Patient with history of diabetes, obesity, hypertension, dyslipidemia, heart failure, spinal stenosis and chronic pain, history of pulmonary embolism after a hernia repair, taking Eliquis, hypothyroidism  Here today for follow-up visit, we last had a virtual visit in March  She has status post hysterectomy-patient wonders if she needs a pelvic exam.  I advised her that if she had a hysterectomy for benign disease, continued pelvic exams are most likely not necessary  Foot exam due Urine microalbumin due Flu shot- give today  Can suggest shingles vaccine, pneumonia up-to-date A1c is due- will do today  Eye exam; she had this done perhaps in June Mammogram due- will order for her  Colonoscopy is up-to-date Most recent labs in March of this year  Albuterol as needed Eliquis 5 twice daily Flexeril as needed Lasix 20, 2 daily Lovastatin 20 Metformin 500 twice daily Thyroid replacement Tramadol as needed  She notes that she is having bloody noses and severe headaches every day for the last year- since her hernia operation.  Pt feels this is due to having compression socks on that "cut off the circulation to my legs."  See further details below She notes the nosebleeds on a variable basis. She had one that lasted 70 minutes 2 weeks ago, none since.  No blood noted in her urine or stool.  However this history  is somewhat difficult to tease out, La feels that she may see tomatoes in her stool and possibly a red tinge to her urine, as she eats tomatoes every single day She is not sure if any blood in her stool except once maybe a year ago.  Negative colon in 2017.   She notes that she will feel SOB and "my lungs are filled up and I can't breathe" for at least 4 months.  This happens every evening but gets better overnight. She will feel "like my chest is full." It is not chest pain but SOB    She has noted a "pinching under my left breast" since 2015.  She "admitted myself to the hospital" in 2015 for this concern -cannot see this admission in epic However she actually had a cardiac cath in 2018 which was normal:  The left ventricular systolic function is normal.  LV end diastolic pressure is normal.  The left ventricular ejection fraction is 55-65% by visual estimate.   1. Normal coronary anatomy 2. Normal LV function 3. Normal LV EDP   She also notes urinary incontinence for the last year or more.   She notes that she has both stress and urge incontinence.  She would like to see urology for evaluation assuming her urine culture is negative She goes through 4-6 urine pads a day or more   Kathleen Miller continues to endorse that many of her medical problems stem back to a hospital admission in January 2019.  At that time she had a  ventral incisional hernia repair.  She had a Roux-en-Y gastric bypass in 1980s.  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  However intraoperatively she had extensive adhesions and this was not feasible.  The patient endorses that on the day of her surgery, "only 1 of the 2 surgeons who is supposed to be there showed up" and this is why she did not have revision of her gastric bypass.  Per notes I do not think this is actually what happened.  Kathleen Miller developed a bilateral  pulmonary embolus postoperative, and was treated.  She remains convinced that her PE was result of compression stockings that were applied during her hospitalization  02/01/2019  1   01/31/2019  Tramadol Hcl 50 MG Tablet  28.00  7 [address]   [id number]   [facility] ([id number])   0  20.00 MME  Medicare   [place]  11/05/2018  1   10/27/2018  Tramadol Hcl 50 MG Tablet  90.00  30 Sh Dal   [id number]   Wal ([id number])   0  15.00 MME  Medicare   [place]  05/09/2018  1   11/10/2017  Tramadol Hcl 50 MG Tablet  21.00  7 Sh Dal   [id number]   Wal ([id number])   1  15.00 MME  Medicare   [place]  04/20/2018  1   04/20/2018  Hydromet Syrup  105.00  [address]   [id number]   Wal ([id number])   0  15.00 MME  Private Pay   [place]  12/16/2017  1   12/14/2017  Tramadol Hcl 50 MG Tablet  20.00  [address] [place]   [id number]   Wal ([id number])   0  20.00 MME  Medicare   [place]  11/10/2017  2   11/10/2017  Tramadol Hcl 50 MG Tablet  90.00  30 Sh Dal   [id number]   Wal ([id number])   0  15.00 MME        Patient Active Problem List   Diagnosis Date Noted  . Pressure injury of skin 12/20/2017  . Pulmonary embolism ([place]) 12/17/2017  . Leukocytosis 12/17/2017  . Hyponatremia 12/17/2017  . ARF (acute renal failure) ([place]) 12/17/2017  . Incisional hernia 12/13/2017  . Chest pain 08/05/2017  . Abnormal nuclear stress test 08/05/2017  . Low bone mass 02/23/2017  . Chronic pain syndrome 07/18/2016  . Dyslipidemia 07/12/2016  . LUQ pain 05/27/2016  . GERD (gastroesophageal reflux disease) 05/27/2016  . Encounter for screening mammogram for breast cancer 11/26/2014  . Slow transit constipation 06/06/2014  . Diabetes ([place]) 12/04/2013  . Chronic combined systolic and diastolic heart failure ([place]) 12/04/2013  . Essential hypertension, benign 12/04/2013  . Spinal stenosis of lumbar region 12/04/2013  . Morbid obesity ([place]) 08/04/2013  . Acute combined systolic and diastolic congestive heart failure ([place]) 08/02/2013  . Type II or unspecified type diabetes mellitus without mention of complication, uncontrolled  08/02/2013    Past Medical History:  Diagnosis Date  . Chronic back pain   . Hypothyroidism   . Pneumonia   . Pre-diabetes    takes metformin preventatively  . Ventral hernia     Past Surgical History:  Procedure Laterality Date  . ABDOMINAL HYSTERECTOMY  11/2011  . CHOLECYSTECTOMY  1988  . COLONOSCOPY WITH PROPOFOL N/A 05/27/2016   Procedure: COLONOSCOPY WITH PROPOFOL;  Surgeon: Wilford Corner, MD;  [facility];  Service: Endoscopy;  Laterality: N/A;  .  DILATION AND CURETTAGE OF UTERUS     x2  . ESOPHAGOGASTRODUODENOSCOPY (EGD) WITH PROPOFOL N/A 05/27/2016   Procedure: ESOPHAGOGASTRODUODENOSCOPY (EGD) WITH PROPOFOL;  Surgeon: Wilford Corner, MD;  [facility];  Service: Endoscopy;  Laterality: N/A;  . GASTRIC BYPASS    . GASTRIC ROUX-EN-Y N/A 12/13/2017   Procedure: LAPAROSCOPIC ASSTED VENTRAL HERNIA REPAIR, Upper Endo;  Surgeon: Excell Seltzer, MD;  [facility];  Service: General;  Laterality: N/A;  With MESH  . LEFT HEART CATH AND CORONARY ANGIOGRAPHY N/A 08/05/2017   Procedure: LEFT HEART CATH AND CORONARY ANGIOGRAPHY;  Surgeon: Martinique, Peter M, MD;  Location: [facility];  Service: Cardiovascular;  Laterality: N/A;  . revision gastric bypass     and ventrel hernia repair  Dr. Excell Seltzer 12-13-17  . SPLENECTOMY, TOTAL  1988    Social History   Tobacco Use  . Smoking status: Never Smoker  . Smokeless tobacco: Never Used  Substance Use Topics  . Alcohol use: No  . Drug use: No    History reviewed. No pertinent family history.  Allergies  Allergen Reactions  . Nsaids Swelling and Other (See Comments)    Severe swelling--water retention (including water on brain)  . Tolmetin Swelling and Other (See Comments)    Severe swelling-water retention (including water on brain)     Medication list has been reviewed and updated.  Current Outpatient Medications on File Prior to Visit  Medication Sig Dispense Refill  . albuterol (PROAIR  HFA) 108 (90 Base) MCG/ACT inhaler Inhale 2 puffs into the lungs every 6 (six) hours as needed for wheezing or shortness of breath. 18 g 5  . apixaban (ELIQUIS) 5 MG TABS tablet Take 1 tablet (5 mg total) by mouth 2 (two) times daily. 180 tablet 3  . cyclobenzaprine (FLEXERIL) 10 MG tablet Take 0.5-1 tablets (5-10 mg total) by mouth 3 (three) times daily as needed for muscle spasms. 60 tablet 0  . furosemide (LASIX) 20 MG tablet TAKE 2 TABLETS BY MOUTH ONCE DAILY. ADJUST DOSE AS RECOMMENDED BY MD 180 tablet 1  . lovastatin (MEVACOR) 20 MG tablet Take 1 tablet (20 mg total) by mouth at bedtime. 90 tablet 3  . metFORMIN (GLUCOPHAGE) 500 MG tablet Take 1 tablet (500 mg total) by mouth 2 (two) times daily with a meal. 180 tablet 3  . thyroid (NP THYROID) 60 MG tablet Take 1 tablet (60 mg total) by mouth daily before breakfast. 90 tablet 3  . traMADol (ULTRAM) 50 MG tablet Take 1 tablet (50 mg total) by mouth every 6 (six) hours as needed for moderate pain. 30 tablet 0   No current facility-administered medications on file prior to visit.     Review of Systems:  As per HPI- otherwise negative. No fever or chills  Physical Examination: Vitals:   08/23/19 0817  BP: 120/82  Pulse: 70  Resp: 18  Temp: (!) 97 F (36.1 C)  SpO2: 98%   Vitals:   08/23/19 0817  Weight: 274 lb (124.3 kg)  Height: '5\' 4"'  (1.626 m)   Body mass index is 47.03 kg/m. Ideal Body Weight: Weight in (lb) to have BMI = 25: 145.3  GEN: WDWN, NAD, Non-toxic, A & O x 3, obese, appears her normal self  HEENT: Atraumatic, Normocephalic. Neck supple. No masses, No LAD.  PEERL, TM wnl bilaterally  Ears and Nose: No external deformity. CV: RRR, No M/G/R. No JVD. No thrill. No extra heart sounds. PULM: CTA B, no wheezes, crackles, rhonchi. No  retractions. No resp. distress. No accessory muscle use. ABD: S, NT, ND, +BS. No rebound. No HSM. EXTR: No c/c.  Minimal edema in both feet, normal pedal pulses  NEURO Normal gait for  pt, uses a walker PSYCH: Normally interactive. Conversant. Not depressed or anxious appearing.  Calm demeanor.  Breast exam: normal breasts and axillae bilaterally.  Pt has complaint of severe tenderness to any pressure on the ribs just under the left breast.  No skin change noted   EKG: NSR, no concerning findings Compared with previous EKG 11/2017 some minor changes are noted.  EKG today is more normal  Dg Chest 2 View  Result Date: 08/23/2019 CLINICAL DATA:  Pt having sob since 2015,nonsmoker,pain under left breast EXAM: CHEST - 2 VIEW COMPARISON:  02/15/2019 FINDINGS: Low lung volumes with mild crowding of bronchovascular structures as before. No focal infiltrate or overt edema. Heart size and mediastinal contours are within normal limits. No effusion. Thoracolumbar scoliosis. Anterior vertebral endplate spurring at multiple levels in the mid and lower thoracic spine. IMPRESSION: No acute cardiopulmonary disease. Electronically Signed   By: Lucrezia Europe M.D.   On: 08/23/2019 10:01    Assessment and Plan: Controlled type 2 diabetes mellitus without complication, without long-term current use of insulin ([place]) - Plan: Comprehensive metabolic panel, Hemoglobin A1c, Microalbumin / creatinine urine ratio  Dyslipidemia - Plan: Lipid panel  Spinal stenosis of lumbosacral region  Anticoagulant long-term use  Acquired hypothyroidism - Plan: TSH  Screening for deficiency anemia - Plan: CBC  Morbid obesity ([place])  Essential hypertension, benign - Plan: CBC, Comprehensive metabolic panel  Screening for breast cancer - Plan: MM 3D SCREEN BREAST BILATERAL  Urinary incontinence, unspecified type - Plan: Urine Culture, Ambulatory referral to Urology  Chest tightness - Plan: DG Chest 2 View, EKG 12-Lead, B Nat Peptide  Needs flu shot - Plan: Flu Vaccine QUAD High Dose(Fluad)  Challenging patient here today with a list of concerns.   Pain under left breast- longstanding for several years.  Cardiac  cath 2 years ago negative Will obtain chest film and mammogram Suspect MSK as reproducible today.  EKG reassuring, recent negative cath  Daily incontinence of urine for over a year- urine culture pending, referral to urology  Feeling of chest "filling up" on a daily basis for months- chest film, BNP pending Doubt PE as she is anticoagulated on eliquis, sx for months with no apparent consequence, vitals normal  Treated with tramadol prn for her chronic pain   Flu shot Will plan further follow- up pending labs.   Signed Lamar Blinks, MD  Received her labs as follows: will call pt tomorrow to discuss  Mild anemia is not new, will send stool cards Mild elevation of alk phos- will try and add on GGT DM well controlled Lipids ok Thyroid in range BNP negative   Results for orders placed or performed in visit on 08/23/19  CBC  Result Value Ref Range   WBC 8.9 4.0 - 10.5 K/uL   RBC 4.43 3.87 - 5.11 Mil/uL   Platelets 464.0 (H) 150.0 - 400.0 K/uL   Hemoglobin 11.3 (L) 12.0 - 15.0 g/dL   HCT 35.2 (L) 36.0 - 46.0 %   MCV 79.4 78.0 - 100.0 fl   MCHC 32.0 30.0 - 36.0 g/dL   RDW 15.8 (H) 11.5 - 15.5 %  Comprehensive metabolic panel  Result Value Ref Range   Sodium 136 135 - 145 mEq/L   Potassium 4.2 3.5 - 5.1 mEq/L   Chloride  100 96 - 112 mEq/L   CO2 30 19 - 32 mEq/L   Glucose, Bld 99 70 - 99 mg/dL   BUN 12 6 - 23 mg/dL   Creatinine, Ser 0.76 0.40 - 1.20 mg/dL   Total Bilirubin 0.3 0.2 - 1.2 mg/dL   Alkaline Phosphatase 125 (H) 39 - 117 U/L   AST 14 0 - 37 U/L   ALT 13 0 - 35 U/L   Total Protein 6.9 6.0 - 8.3 g/dL   Albumin 3.8 3.5 - 5.2 g/dL   Calcium 9.1 8.4 - 10.5 mg/dL   GFR 74.99 >60.00 mL/min  Hemoglobin A1c  Result Value Ref Range   Hgb A1c MFr Bld 6.6 (H) 4.6 - 6.5 %  Lipid panel  Result Value Ref Range   Cholesterol 156 0 - 200 mg/dL   Triglycerides 159.0 (H) 0.0 - 149.0 mg/dL   HDL 37.80 (L) >39.00 mg/dL   VLDL 31.8 0.0 - 40.0 mg/dL   LDL Cholesterol 87 0  - 99 mg/dL   Total CHOL/HDL Ratio 4    NonHDL 118.68   Microalbumin / creatinine urine ratio  Result Value Ref Range   Microalb, Ur <0.7 0.0 - 1.9 mg/dL   Creatinine,U 95.3 mg/dL   Microalb Creat Ratio 0.7 0.0 - 30.0 mg/g  TSH  Result Value Ref Range   TSH 1.85 0.35 - 4.50 uIU/mL  B Nat Peptide  Result Value Ref Range   Pro B Natriuretic peptide (BNP) 49.0 0.0 - 100.0 pg/mL   addnd 9/25- received her GGT, normal  Called pt to discuss.  Her eliquis cost went up and she cannot afford.  She has some on hand and is taking.  Will ask staff to look into this for her Discussed chest pain under left breast. Present for about 5 years Offered to do a CT of her chest and/or see cards, also plan mammo She would like to check on cost of Ct chest and will let me know

## 2019-08-23 ENCOUNTER — Other Ambulatory Visit: Payer: Self-pay

## 2019-08-23 ENCOUNTER — Ambulatory Visit (HOSPITAL_BASED_OUTPATIENT_CLINIC_OR_DEPARTMENT_OTHER)
Admission: RE | Admit: 2019-08-23 | Discharge: 2019-08-23 | Disposition: A | Discharge status: Home / Self Care | Payer: Medicare Other | Source: Ambulatory Visit | Attending: Family Medicine | Admitting: Family Medicine

## 2019-08-23 ENCOUNTER — Encounter: Payer: Self-pay | Admitting: Family Medicine

## 2019-08-23 ENCOUNTER — Ambulatory Visit (INDEPENDENT_AMBULATORY_CARE_PROVIDER_SITE_OTHER): Payer: Medicare Other | Admitting: Family Medicine

## 2019-08-23 VITALS — BP 120/82 | HR 70 | Temp 97.0°F | Resp 18 | Ht 64.0 in | Wt 274.0 lb

## 2019-08-23 DIAGNOSIS — R0789 Other chest pain: Secondary | ICD-10-CM | POA: Diagnosis not present

## 2019-08-23 DIAGNOSIS — M4807 Spinal stenosis, lumbosacral region: Secondary | ICD-10-CM

## 2019-08-23 DIAGNOSIS — Z1239 Encounter for other screening for malignant neoplasm of breast: Secondary | ICD-10-CM

## 2019-08-23 DIAGNOSIS — I10 Essential (primary) hypertension: Secondary | ICD-10-CM | POA: Diagnosis not present

## 2019-08-23 DIAGNOSIS — Z130 Encounter for screening for diseases of the blood and blood-forming organs and certain disorders involving the immune mechanism: Secondary | ICD-10-CM | POA: Diagnosis not present

## 2019-08-23 DIAGNOSIS — E785 Hyperlipidemia, unspecified: Secondary | ICD-10-CM

## 2019-08-23 DIAGNOSIS — D649 Anemia, unspecified: Secondary | ICD-10-CM

## 2019-08-23 DIAGNOSIS — Z7901 Long term (current) use of anticoagulants: Secondary | ICD-10-CM | POA: Diagnosis not present

## 2019-08-23 DIAGNOSIS — E039 Hypothyroidism, unspecified: Secondary | ICD-10-CM | POA: Diagnosis not present

## 2019-08-23 DIAGNOSIS — Z23 Encounter for immunization: Secondary | ICD-10-CM

## 2019-08-23 DIAGNOSIS — R32 Unspecified urinary incontinence: Secondary | ICD-10-CM

## 2019-08-23 DIAGNOSIS — E119 Type 2 diabetes mellitus without complications: Secondary | ICD-10-CM | POA: Diagnosis not present

## 2019-08-23 DIAGNOSIS — R0602 Shortness of breath: Secondary | ICD-10-CM | POA: Diagnosis not present

## 2019-08-23 LAB — CBC
HCT: 35.2 % — ABNORMAL LOW (ref 36.0–46.0)
Hemoglobin: 11.3 g/dL — ABNORMAL LOW (ref 12.0–15.0)
MCHC: 32 g/dL (ref 30.0–36.0)
MCV: 79.4 fl (ref 78.0–100.0)
Platelets: 464 10*3/uL — ABNORMAL HIGH (ref 150.0–400.0)
RBC: 4.43 Mil/uL (ref 3.87–5.11)
RDW: 15.8 % — ABNORMAL HIGH (ref 11.5–15.5)
WBC: 8.9 10*3/uL (ref 4.0–10.5)

## 2019-08-23 LAB — COMPREHENSIVE METABOLIC PANEL
ALT: 13 U/L (ref 0–35)
AST: 14 U/L (ref 0–37)
Albumin: 3.8 g/dL (ref 3.5–5.2)
Alkaline Phosphatase: 125 U/L — ABNORMAL HIGH (ref 39–117)
BUN: 12 mg/dL (ref 6–23)
CO2: 30 mEq/L (ref 19–32)
Calcium: 9.1 mg/dL (ref 8.4–10.5)
Chloride: 100 mEq/L (ref 96–112)
Creatinine, Ser: 0.76 mg/dL (ref 0.40–1.20)
GFR: 74.99 mL/min (ref >60.00)
Glucose, Bld: 99 mg/dL (ref 70–99)
Potassium: 4.2 mEq/L (ref 3.5–5.1)
Sodium: 136 mEq/L (ref 135–145)
Total Bilirubin: 0.3 mg/dL (ref 0.2–1.2)
Total Protein: 6.9 g/dL (ref 6.0–8.3)

## 2019-08-23 LAB — MICROALBUMIN / CREATININE URINE RATIO
Creatinine,U: 95.3 mg/dL
Microalb Creat Ratio: 0.7 mg/g (ref 0.0–30.0)
Microalb, Ur: <0.7 mg/dL (ref 0.0–1.9)

## 2019-08-23 LAB — TSH: TSH: 1.85 u[IU]/mL (ref 0.35–4.50)

## 2019-08-23 LAB — HEMOGLOBIN A1C: Hgb A1c MFr Bld: 6.6 % — ABNORMAL HIGH (ref 4.6–6.5)

## 2019-08-23 LAB — LIPID PANEL
Cholesterol: 156 mg/dL (ref 0–200)
HDL: 37.8 mg/dL — ABNORMAL LOW (ref >39.00)
LDL Cholesterol: 87 mg/dL (ref 0–99)
NonHDL: 118.68
Total CHOL/HDL Ratio: 4
Triglycerides: 159 mg/dL — ABNORMAL HIGH (ref 0.0–149.0)
VLDL: 31.8 mg/dL (ref 0.0–40.0)

## 2019-08-23 LAB — BRAIN NATRIURETIC PEPTIDE: Pro B Natriuretic peptide (BNP): 49 pg/mL (ref 0.0–100.0)

## 2019-08-24 ENCOUNTER — Other Ambulatory Visit (INDEPENDENT_AMBULATORY_CARE_PROVIDER_SITE_OTHER): Payer: Medicare Other

## 2019-08-24 DIAGNOSIS — R748 Abnormal levels of other serum enzymes: Secondary | ICD-10-CM

## 2019-08-24 LAB — URINE CULTURE
MICRO NUMBER:: 918307
SPECIMEN QUALITY:: ADEQUATE

## 2019-08-24 LAB — GAMMA GT: GGT: 16 U/L (ref 7–51)

## 2019-08-24 NOTE — Addendum Note (Signed)
Addended by: Lamar Blinks C on: 08/24/2019 01:01 PM   Modules accepted: Orders

## 2019-09-06 ENCOUNTER — Telehealth: Payer: Self-pay

## 2019-09-06 DIAGNOSIS — Z7901 Long term (current) use of anticoagulants: Secondary | ICD-10-CM

## 2019-09-06 NOTE — Telephone Encounter (Signed)
Copied from [place] [id number]. Topic: Quick Communication - Rx Refill/Question >> Sep 06, 2019 11:47 AM [name] wrote: Medication: apixaban (ELIQUIS) 5 MG TABS tablet Pt called to report that medication is now priced too high for her and she cannot afford it. She is seeking call back to discuss more affordable options. Please advise  Best contact: [phone]  Pt is out of medication, for a week.  [place], [address] [place] [address] Phone: [phone] Fax: [phone]

## 2019-09-06 NOTE — Telephone Encounter (Signed)
We could consider coumadin - however monitoring may be a problem We have plenty of eliquis 5 samples Reviewed her chart, she had a PE following surgery in 2019.  She saw pulmonology, at that time they recommended lifelong anticoagulation unless she was cleared by hematology.  We are not sure if this PE was provoked, or if she may have an underlying hypercoagulable state. I called patient and left a detailed message on her answering machine Would suggest using Eliquis samples while we get her in for hematology consultation- ?does she need to continue anti- coagulation long term  I will leave some Eliquis samples with my nurse, and will set her up for hematology appointment

## 2019-09-06 NOTE — Telephone Encounter (Signed)
Patient called back, she said she did not listen to Dr. Lillie Fragmin message yet. She was advised to listen to the message because it had information and instructions form Dr. Loletha Grayer for her. (per Dr. Lorelei Pont).

## 2019-09-07 ENCOUNTER — Telehealth: Payer: Self-pay | Admitting: Hematology

## 2019-09-07 NOTE — Telephone Encounter (Signed)
Spoke with patient to confirm new patient appt. Patient requested 11/12 at 0830 am

## 2019-09-21 ENCOUNTER — Other Ambulatory Visit: Payer: Medicare Other

## 2019-09-21 ENCOUNTER — Inpatient Hospital Stay: Payer: Medicare Other | Admitting: Hematology

## 2019-10-08 ENCOUNTER — Other Ambulatory Visit: Payer: Self-pay | Admitting: Hematology

## 2019-10-08 DIAGNOSIS — I2699 Other pulmonary embolism without acute cor pulmonale: Secondary | ICD-10-CM

## 2019-10-08 NOTE — Progress Notes (Signed)
[facility] NOTE  Patient Care Team: Copland, Gay Filler, MD as PCP - General (Family Medicine) Dalton-Bethea, Fabio Asa, MD as Consulting Physician (Physical Medicine and Rehabilitation)  HEME/ONC OVERVIEW: 1. Hx of bilateral PTE, at least submassive; likely provoked -Likely provoked in the setting of surgery -11/2017:   Bilateral occlusive PTE involving the upper and lower lobes, including proximal segmental pulmonary arteries; CT evidence of RV strain with troponin elevation (~0.4)  Normal LVEF, mildly dilated RV and RA on echocardiogram  On Eliquis 5mg  BID since then -02/2018: doppler of LE negative for DVT   ASSESSMENT & PLAN:   Hx of bilateral PTE, at least submassive; likely provoked -I reviewed the patient's records in detail, including PCP and hospitalization records, lab studies, and imaging results. -I also independently reviewed the radiologic images of CTA chest in 2019, and agree with findings documented -In summary, patient has a history of Roux-en-Y gastric bypass in 1980's and was scheduled for revision and ventral hernia repair in 11/2017, but during the surgery, she was found with extensive adhesions, and the gastric bypass revision was aborted due to extensive scarring, and the hernia was repaired successfully.  However, she developed new onset dyspnea within a week of discharge, and CTA chest showed bilateral occlusive PTE involving the upper and lower lobes, including proximal segmental pulmonary arteries.  There was also CT evidence of RV strain with troponin elevation (~0.4), and as a result, she was admitted to the ICU and started on heparin gtt.  Her hypoxic respiratory distress resolved with anticoagulation, and she did not require tPA.  She was discharged on Eliquis and has remained on it since then.  Doppler of LE was negative for DVT in 02/2018.  -I reviewed with the patient about the plan for care for PTE. -This last episode of blood clot  appeared to be provoked. We discussed about the pros and cons about testing for thrombophilia disorder. Her current anticoagulation therapy will interfere with some the tests and it is not possible to interpret the test results. Taking her off the anticoagulation therapy to do the tests may precipitate another thrombotic event. I do not see a reason to order additional testing to screen for thrombophilia disorder as it would not change our management. In the absence of major contraindications, the goal of anticoagulation therapy is lifelong.  -Due to the extensiveness of her previous PTE, I would recommend continuing Eliquis 5mg  BID in the absence of significant bleeding (instead of reducing the dose to 2.5mg  BID). -I agree with elastic compression stockings to reduce risks of thrombophlebitis. -I also reinforced the importance of preventive strategies such as avoiding hormonal supplement, avoiding cigarette smoking, keeping up-to-date with screening programs for early cancer detection, frequent ambulation for long distance travel and aggressive DVT prophylaxis in all surgical settings. -Should she need any interruption of the anticoagulation for elective procedures in the future, feel free to contact me regarding peri-operative management. -Finally, patient has had significant financial hardship with affording her medication.  Unfortunately, we do not have any free sample to provide the patient, but I have asked the financial counselor to initiate urgent application for medication assistance for Eliquis.  If Xarelto is less expensive for her, we can also change it to Xarelto.   Microcytic anemia -Hgb fluctuating between 11 and 12 with borderline low MCV dating back to 2019 -Hgb 11.6 today, MCV 79 -Colonoscopy in 2017 showed several sessile polyps; EGD showed Roux-en-Y gastrojejunostomy without evidence of bleeding  -I have  ordered iron panel today -Given the hx of gastric bypass, this is likely due to  malabsorption  -We discussed some of the risks, benefits, and alternatives of intravenous iron infusions.  -The patient is symptomatic from anemia and the iron level is critically low; as such, oral supplement is not sufficient to replete iron storage quickly and she will need IV iron to higher levels of iron faster for adequate hematopoiesis.  -Some of the side-effects to be expected including risks of infusion reactions, phlebitis, headaches, nausea and fatigue.   -The patient is willing to proceed.  Plan for 4 doses.  -Goal is to keep ferritin level greater than 50.  Intermittent headaches -Possibly related to iron deficiency anemia vs rebound headache from frequent pseudoephedrine use -See the management of IDA above -I counseled patient on avoiding taking pseudoephedrine regularly due to tachyphylaxis and risk of rebound headache -Continue follow-up with PCP  Hyperlipidemia -Patient is currently taking statin, and requested lipid profile to be done today -I recommend the patient to follow up with her PCP for further management of statin  Orders Placed This Encounter  Procedures  . Ferritin    Standing Status:   Future    Number of Occurrences:   1    Standing Expiration Date:   11/14/2020  . Iron and TIBC    Standing Status:   Future    Number of Occurrences:   1    Standing Expiration Date:   11/14/2020  . Lipid panel    Standing Status:   Future    Number of Occurrences:   1    Standing Expiration Date:   10/10/2020  . CBC w/ diff    Standing Status:   Future    Standing Expiration Date:   11/14/2020  . CMP    Standing Status:   Future    Standing Expiration Date:   11/14/2020  . Ferritin    Standing Status:   Future    Standing Expiration Date:   11/14/2020  . Iron and TIBC    Standing Status:   Future    Standing Expiration Date:   11/14/2020  . D-dimer    Standing Status:   Future    Standing Expiration Date:   11/14/2020   All questions were answered. The  patient knows to call the clinic with any problems, questions or concerns.  Tish Men, MD 10/11/2019 9:59 AM   CHIEF COMPLAINTS/PURPOSE OF CONSULTATION:  "I am doing okay"  HISTORY OF PRESENTING ILLNESS:  Kathleen Miller 73 y.o. female is here because of history of bilateral PTE on Eliquis.  Patient has a history of Roux-en-Y gastric bypass in 1980's and was scheduled for revision and ventral hernia repair in 11/2017, but during the surgery, she was found with extensive adhesions, and the gastric bypass revision was aborted due to extensive scarring, and the hernia was repaired successfully.  However, she developed new onset dyspnea within a week of discharge, and CTA chest showed bilateral occlusive PTE involving the upper and lower lobes, including proximal segmental pulmonary arteries.  There was also CT evidence of RV strain with troponin elevation (~0.4), and as a result, she was admitted to the ICU and started on heparin gtt.  Her hypoxic respiratory distress resolved with anticoagulation, and she did not require tPA.  She was discharged on Eliquis and has remained on it since then.  Doppler of LE was negative for DVT in 02/2018.   Patient reports that she still has shortness of  breath, usually at night, occurring sporadically both at rest and with exertion, but she denies any associated chest pain, palpitation, diaphoresis.  Coughing helps with the shortness of breath.  She also reports daily headache and sinus congestion, for which she takes Sudafed daily.  She also has had periodic epistaxis, but it usually stops with holding pressure.  She has not had any recurrent epistaxis for the past month.  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  She has had significant difficulty affording her Eliquis prescription due to the high cost, and inquires and the medication assistance program.  She denies any other complaint today.  REVIEW OF SYSTEMS:   Constitutional: ( -  ) fevers, ( - )  chills , ( - ) night sweats Eyes: ( - ) blurriness of vision, ( - ) double vision, ( - ) watery eyes Ears, nose, mouth, throat, and face: ( - ) mucositis, ( - ) sore throat Respiratory: ( + ) cough, ( + ) dyspnea, ( - ) wheezes Cardiovascular: ( - ) palpitation, ( - ) chest discomfort, ( + ) lower extremity swelling Gastrointestinal:  ( - ) nausea, ( - ) heartburn, ( - ) change in bowel habits Skin: ( - ) abnormal skin rashes Lymphatics: ( - ) new lymphadenopathy, ( - ) easy bruising Neurological: ( - ) numbness, ( - ) tingling, ( - ) new weaknesses Behavioral/Psych: ( - ) mood change, ( - ) new changes  All other systems were reviewed with the patient and are negative.  I have reviewed her chart and materials related to her cancer extensively and collaborated history with the patient. Summary of oncologic history is as follows: Oncology History   No history exists.    MEDICAL HISTORY:  Past Medical History:  Diagnosis Date  . Chronic back pain   . Hypothyroidism   . Pneumonia   . Pre-diabetes    takes metformin preventatively  . Ventral hernia     SURGICAL HISTORY: Past Surgical History:  Procedure Laterality Date  . ABDOMINAL HYSTERECTOMY  11/2011  . CHOLECYSTECTOMY  1988  . COLONOSCOPY WITH PROPOFOL N/A 05/27/2016   Procedure: COLONOSCOPY WITH PROPOFOL;  Surgeon: Wilford Corner, MD;  Location: [facility];  Service: Endoscopy;  Laterality: N/A;  . DILATION AND CURETTAGE OF UTERUS     x2  . ESOPHAGOGASTRODUODENOSCOPY (EGD) WITH PROPOFOL N/A 05/27/2016   Procedure: ESOPHAGOGASTRODUODENOSCOPY (EGD) WITH PROPOFOL;  Surgeon: Wilford Corner, MD;  Location: [facility];  Service: Endoscopy;  Laterality: N/A;  . GASTRIC BYPASS    . GASTRIC ROUX-EN-Y N/A 12/13/2017   Procedure: LAPAROSCOPIC ASSTED VENTRAL HERNIA REPAIR, Upper Endo;  Surgeon: Excell Seltzer, MD;  Location: [facility];  Service: General;  Laterality: N/A;  With MESH  . LEFT HEART CATH AND CORONARY  ANGIOGRAPHY N/A 08/05/2017   Procedure: LEFT HEART CATH AND CORONARY ANGIOGRAPHY;  Surgeon: Martinique, Peter M, MD;  Location: [facility];  Service: Cardiovascular;  Laterality: N/A;  . revision gastric bypass     and ventrel hernia repair  Dr. Excell Seltzer 12-13-17  . SPLENECTOMY, TOTAL  1988    SOCIAL HISTORY: Social History   Socioeconomic History  . Marital status: Divorced    Spouse name: Not on file  . Number of children: Not on file  . Years of education: Not on file  . Highest education level: Not on file  Occupational History  . Not on file  Social Needs  . Emergency planning/management officer  strain: Not on file  . Food insecurity    Worry: Not on file    Inability: Not on file  . Transportation needs    Medical: Not on file    Non-medical: Not on file  Tobacco Use  . Smoking status: Never Smoker  . Smokeless tobacco: Never Used  Substance and Sexual Activity  . Alcohol use: No  . Drug use: No  . Sexual activity: Never  Lifestyle  . Physical activity    Days per week: Not on file    Minutes per session: Not on file  . Stress: Not on file  Relationships  . Social Herbalist on phone: Not on file    Gets together: Not on file    Attends religious service: Not on file    Active member of club or organization: Not on file    Attends meetings of clubs or organizations: Not on file    Relationship status: Not on file  . Intimate partner violence    Fear of current or ex partner: Not on file    Emotionally abused: Not on file    Physically abused: Not on file    Forced sexual activity: Not on file  Other Topics Concern  . Not on file  Social History Narrative  . Not on file    FAMILY HISTORY: History reviewed. No pertinent family history.  ALLERGIES:  is allergic to nsaids and tolmetin.  MEDICATIONS:  Current Outpatient Medications  Medication Sig Dispense Refill  . albuterol (PROAIR HFA) 108 (90 Base) MCG/ACT inhaler Inhale 2 puffs into the lungs every 6  (six) hours as needed for wheezing or shortness of breath. 18 g 5  . apixaban (ELIQUIS) 5 MG TABS tablet Take 1 tablet (5 mg total) by mouth 2 (two) times daily. 60 tablet 5  . cyclobenzaprine (FLEXERIL) 10 MG tablet Take 0.5-1 tablets (5-10 mg total) by mouth 3 (three) times daily as needed for muscle spasms. 60 tablet 0  . furosemide (LASIX) 20 MG tablet TAKE 2 TABLETS BY MOUTH ONCE DAILY. ADJUST DOSE AS RECOMMENDED BY MD 180 tablet 1  . lovastatin (MEVACOR) 20 MG tablet Take 1 tablet (20 mg total) by mouth at bedtime. 90 tablet 3  . metFORMIN (GLUCOPHAGE) 500 MG tablet Take 1 tablet (500 mg total) by mouth 2 (two) times daily with a meal. 180 tablet 3  . thyroid (NP THYROID) 60 MG tablet Take 1 tablet (60 mg total) by mouth daily before breakfast. 90 tablet 3  . traMADol (ULTRAM) 50 MG tablet Take 1 tablet (50 mg total) by mouth every 6 (six) hours as needed for moderate pain. 30 tablet 0   No current facility-administered medications for this visit.     PHYSICAL EXAMINATION: ECOG PERFORMANCE STATUS: 2 - Symptomatic, <50% confined to bed  Vitals:   10/11/19 0900  BP: 139/60  Pulse: 77  Resp: 18  Temp: 97.9 F (36.6 C)  SpO2: 96%   Filed Weights   10/11/19 0900  Weight: 271 lb 6.4 oz (123.1 kg)    GENERAL: alert, no distress and comfortable, obese  SKIN: skin color, texture, turgor are normal, no rashes or significant lesions EYES: conjunctiva are pink and non-injected, sclera clear OROPHARYNX: no exudate, no erythema; lips, buccal mucosa, and tongue normal  NECK: supple, non-tender LUNGS: clear to auscultation with normal breathing effort HEART: regular rate & rhythm, no murmurs, 1-2+ bilateral lower extremity edema ABDOMEN: soft, non-tender, non-distended, normal bowel sounds Musculoskeletal: no cyanosis  of digits and no clubbing  PSYCH: alert & oriented x 3, fluent speech  LABORATORY DATA:  I have reviewed the data as listed Lab Results  Component Value Date   WBC 9.5  10/11/2019   HGB 11.6 (L) 10/11/2019   HCT 37.0 10/11/2019   MCV 79.9 (L) 10/11/2019   PLT 523 (H) 10/11/2019   Lab Results  Component Value Date   NA 137 10/11/2019   K 4.0 10/11/2019   CL 101 10/11/2019   CO2 28 10/11/2019    RADIOGRAPHIC STUDIES: I have personally reviewed the radiological images as listed and agreed with the findings in the report. No results found.  PATHOLOGY: I have reviewed the pathology reports as documented in the oncologist history.

## 2019-10-11 ENCOUNTER — Other Ambulatory Visit: Payer: Self-pay

## 2019-10-11 ENCOUNTER — Telehealth: Payer: Self-pay | Admitting: Hematology

## 2019-10-11 ENCOUNTER — Encounter: Payer: Self-pay | Admitting: Hematology

## 2019-10-11 ENCOUNTER — Inpatient Hospital Stay: Payer: Medicare Other | Attending: Hematology

## 2019-10-11 ENCOUNTER — Inpatient Hospital Stay (HOSPITAL_BASED_OUTPATIENT_CLINIC_OR_DEPARTMENT_OTHER): Payer: Medicare Other | Admitting: Hematology

## 2019-10-11 ENCOUNTER — Other Ambulatory Visit: Payer: Self-pay | Admitting: *Deleted

## 2019-10-11 VITALS — BP 139/60 | HR 77 | Temp 97.9°F | Resp 18 | Wt 271.4 lb

## 2019-10-11 DIAGNOSIS — L905 Scar conditions and fibrosis of skin: Secondary | ICD-10-CM | POA: Insufficient documentation

## 2019-10-11 DIAGNOSIS — R7303 Prediabetes: Secondary | ICD-10-CM | POA: Diagnosis not present

## 2019-10-11 DIAGNOSIS — D509 Iron deficiency anemia, unspecified: Secondary | ICD-10-CM | POA: Diagnosis not present

## 2019-10-11 DIAGNOSIS — Z9884 Bariatric surgery status: Secondary | ICD-10-CM | POA: Diagnosis not present

## 2019-10-11 DIAGNOSIS — I2699 Other pulmonary embolism without acute cor pulmonale: Secondary | ICD-10-CM | POA: Diagnosis not present

## 2019-10-11 DIAGNOSIS — E039 Hypothyroidism, unspecified: Secondary | ICD-10-CM | POA: Diagnosis not present

## 2019-10-11 DIAGNOSIS — Z7901 Long term (current) use of anticoagulants: Secondary | ICD-10-CM | POA: Insufficient documentation

## 2019-10-11 DIAGNOSIS — E785 Hyperlipidemia, unspecified: Secondary | ICD-10-CM | POA: Diagnosis not present

## 2019-10-11 DIAGNOSIS — Z86711 Personal history of pulmonary embolism: Secondary | ICD-10-CM | POA: Diagnosis not present

## 2019-10-11 DIAGNOSIS — R519 Headache, unspecified: Secondary | ICD-10-CM | POA: Insufficient documentation

## 2019-10-11 DIAGNOSIS — Z79899 Other long term (current) drug therapy: Secondary | ICD-10-CM | POA: Insufficient documentation

## 2019-10-11 DIAGNOSIS — R05 Cough: Secondary | ICD-10-CM | POA: Diagnosis not present

## 2019-10-11 DIAGNOSIS — Z7984 Long term (current) use of oral hypoglycemic drugs: Secondary | ICD-10-CM | POA: Insufficient documentation

## 2019-10-11 DIAGNOSIS — R0981 Nasal congestion: Secondary | ICD-10-CM | POA: Insufficient documentation

## 2019-10-11 LAB — D-DIMER, QUANTITATIVE: D-Dimer, Quant: 1.48 ug/mL-FEU — ABNORMAL HIGH (ref 0.00–0.50)

## 2019-10-11 LAB — CMP (CANCER CENTER ONLY)
ALT: 12 U/L (ref 0–44)
AST: 14 U/L — ABNORMAL LOW (ref 15–41)
Albumin: 4.1 g/dL (ref 3.5–5.0)
Alkaline Phosphatase: 137 U/L — ABNORMAL HIGH (ref 38–126)
Anion gap: 8 (ref 5–15)
BUN: 12 mg/dL (ref 8–23)
CO2: 28 mmol/L (ref 22–32)
Calcium: 9.4 mg/dL (ref 8.9–10.3)
Chloride: 101 mmol/L (ref 98–111)
Creatinine: 0.86 mg/dL (ref 0.44–1.00)
GFR, Est AFR Am: >60 mL/min (ref >60)
GFR, Estimated: >60 mL/min (ref >60)
Glucose, Bld: 128 mg/dL — ABNORMAL HIGH (ref 70–99)
Potassium: 4 mmol/L (ref 3.5–5.1)
Sodium: 137 mmol/L (ref 135–145)
Total Bilirubin: 0.3 mg/dL (ref 0.3–1.2)
Total Protein: 7.4 g/dL (ref 6.5–8.1)

## 2019-10-11 LAB — CBC WITH DIFFERENTIAL (CANCER CENTER ONLY)
Abs Immature Granulocytes: 0.03 10*3/uL (ref 0.00–0.07)
Basophils Absolute: 0.2 10*3/uL — ABNORMAL HIGH (ref 0.0–0.1)
Basophils Relative: 2 %
Eosinophils Absolute: 0.5 10*3/uL (ref 0.0–0.5)
Eosinophils Relative: 5 %
HCT: 37 % (ref 36.0–46.0)
Hemoglobin: 11.6 g/dL — ABNORMAL LOW (ref 12.0–15.0)
Immature Granulocytes: 0 %
Lymphocytes Relative: 20 %
Lymphs Abs: 1.9 10*3/uL (ref 0.7–4.0)
MCH: 25.1 pg — ABNORMAL LOW (ref 26.0–34.0)
MCHC: 31.4 g/dL (ref 30.0–36.0)
MCV: 79.9 fL — ABNORMAL LOW (ref 80.0–100.0)
Monocytes Absolute: 0.9 10*3/uL (ref 0.1–1.0)
Monocytes Relative: 10 %
Neutro Abs: 6 10*3/uL (ref 1.7–7.7)
Neutrophils Relative %: 63 %
Platelet Count: 523 10*3/uL — ABNORMAL HIGH (ref 150–400)
RBC: 4.63 MIL/uL (ref 3.87–5.11)
RDW: 15.8 % — ABNORMAL HIGH (ref 11.5–15.5)
WBC Count: 9.5 10*3/uL (ref 4.0–10.5)
nRBC: 0 % (ref 0.0–0.2)

## 2019-10-11 LAB — IRON AND TIBC
Iron: 36 ug/dL — ABNORMAL LOW (ref 41–142)
Saturation Ratios: 8 % — ABNORMAL LOW (ref 21–57)
TIBC: 436 ug/dL (ref 236–444)
UIBC: 400 ug/dL — ABNORMAL HIGH (ref 120–384)

## 2019-10-11 LAB — LIPID PANEL
Cholesterol: 187 mg/dL (ref 0–200)
HDL: 41 mg/dL (ref >40)
LDL Cholesterol: 112 mg/dL — ABNORMAL HIGH (ref 0–99)
Total CHOL/HDL Ratio: 4.6 RATIO
Triglycerides: 172 mg/dL — ABNORMAL HIGH (ref <150)
VLDL: 34 mg/dL (ref 0–40)

## 2019-10-11 LAB — FERRITIN: Ferritin: 14 ng/mL (ref 11–307)

## 2019-10-11 MED ORDER — APIXABAN 5 MG PO TABS: 5.0000 mg | ORAL_TABLET | Freq: Two times a day (BID) | ORAL | 5 refills | Status: DC

## 2019-10-11 NOTE — Telephone Encounter (Signed)
Appointments scheduled calendar printed per 11/12 los

## 2019-10-19 ENCOUNTER — Inpatient Hospital Stay: Payer: Medicare Other

## 2019-10-19 ENCOUNTER — Other Ambulatory Visit: Payer: Self-pay

## 2019-10-19 VITALS — BP 130/46 | HR 68 | Temp 97.0°F | Resp 18

## 2019-10-19 DIAGNOSIS — Z7984 Long term (current) use of oral hypoglycemic drugs: Secondary | ICD-10-CM | POA: Diagnosis not present

## 2019-10-19 DIAGNOSIS — D509 Iron deficiency anemia, unspecified: Secondary | ICD-10-CM

## 2019-10-19 DIAGNOSIS — Z79899 Other long term (current) drug therapy: Secondary | ICD-10-CM | POA: Diagnosis not present

## 2019-10-19 DIAGNOSIS — Z9884 Bariatric surgery status: Secondary | ICD-10-CM | POA: Diagnosis not present

## 2019-10-19 DIAGNOSIS — Z86711 Personal history of pulmonary embolism: Secondary | ICD-10-CM | POA: Diagnosis not present

## 2019-10-19 DIAGNOSIS — E785 Hyperlipidemia, unspecified: Secondary | ICD-10-CM | POA: Diagnosis not present

## 2019-10-19 DIAGNOSIS — R0981 Nasal congestion: Secondary | ICD-10-CM | POA: Diagnosis not present

## 2019-10-19 DIAGNOSIS — R519 Headache, unspecified: Secondary | ICD-10-CM | POA: Diagnosis not present

## 2019-10-19 DIAGNOSIS — R7303 Prediabetes: Secondary | ICD-10-CM | POA: Diagnosis not present

## 2019-10-19 DIAGNOSIS — R05 Cough: Secondary | ICD-10-CM | POA: Diagnosis not present

## 2019-10-19 DIAGNOSIS — L905 Scar conditions and fibrosis of skin: Secondary | ICD-10-CM | POA: Diagnosis not present

## 2019-10-19 DIAGNOSIS — E039 Hypothyroidism, unspecified: Secondary | ICD-10-CM | POA: Diagnosis not present

## 2019-10-19 DIAGNOSIS — Z7901 Long term (current) use of anticoagulants: Secondary | ICD-10-CM | POA: Diagnosis not present

## 2019-10-19 MED ORDER — SODIUM CHLORIDE 0.9 % IV SOLN
200.0000 mg | Freq: Once | INTRAVENOUS | Status: AC
  Administered 2019-10-19: 200 mg via INTRAVENOUS
  Filled 2019-10-19: qty 10

## 2019-10-19 MED ORDER — SODIUM CHLORIDE 0.9 % IV SOLN
Freq: Once | INTRAVENOUS | Status: AC
  Administered 2019-10-19: 09:00:00 via INTRAVENOUS
  Filled 2019-10-19: qty 250

## 2019-10-19 NOTE — Patient Instructions (Signed)

## 2019-10-24 ENCOUNTER — Ambulatory Visit
Admission: RE | Admit: 2019-10-24 | Discharge: 2019-10-24 | Disposition: A | Discharge status: Home / Self Care | Payer: Medicare Other | Source: Ambulatory Visit | Attending: Family Medicine | Admitting: Family Medicine

## 2019-10-24 ENCOUNTER — Other Ambulatory Visit: Payer: Self-pay

## 2019-10-24 ENCOUNTER — Other Ambulatory Visit: Payer: Self-pay | Admitting: Family Medicine

## 2019-10-24 DIAGNOSIS — Z1239 Encounter for other screening for malignant neoplasm of breast: Secondary | ICD-10-CM

## 2019-10-24 DIAGNOSIS — N63 Unspecified lump in unspecified breast: Secondary | ICD-10-CM

## 2019-10-24 DIAGNOSIS — N644 Mastodynia: Secondary | ICD-10-CM

## 2019-11-02 ENCOUNTER — Other Ambulatory Visit: Payer: Self-pay

## 2019-11-02 ENCOUNTER — Inpatient Hospital Stay: Payer: Medicare Other | Attending: Hematology

## 2019-11-02 VITALS — BP 135/63 | HR 76 | Temp 97.7°F | Resp 18

## 2019-11-02 DIAGNOSIS — D509 Iron deficiency anemia, unspecified: Secondary | ICD-10-CM | POA: Diagnosis not present

## 2019-11-02 MED ORDER — SODIUM CHLORIDE 0.9 % IV SOLN
Freq: Once | INTRAVENOUS | Status: AC
  Administered 2019-11-02: 09:00:00 via INTRAVENOUS
  Filled 2019-11-02: qty 250

## 2019-11-02 MED ORDER — SODIUM CHLORIDE 0.9 % IV SOLN
200.0000 mg | Freq: Once | INTRAVENOUS | Status: DC
  Filled 2019-11-02: qty 10

## 2019-11-02 MED ORDER — SODIUM CHLORIDE 0.9 % IV SOLN
200.0000 mg | Freq: Once | INTRAVENOUS | Status: AC
  Administered 2019-11-02: 200 mg via INTRAVENOUS
  Filled 2019-11-02: qty 10

## 2019-11-02 NOTE — Patient Instructions (Signed)

## 2019-11-09 ENCOUNTER — Inpatient Hospital Stay: Payer: Medicare Other

## 2019-11-09 ENCOUNTER — Other Ambulatory Visit: Payer: Self-pay

## 2019-11-09 VITALS — BP 156/50 | HR 80 | Temp 96.9°F | Resp 19

## 2019-11-09 DIAGNOSIS — D509 Iron deficiency anemia, unspecified: Secondary | ICD-10-CM | POA: Diagnosis not present

## 2019-11-09 MED ORDER — SODIUM CHLORIDE 0.9 % IV SOLN
Freq: Once | INTRAVENOUS | Status: AC
  Administered 2019-11-09: 09:00:00 via INTRAVENOUS
  Filled 2019-11-09: qty 250

## 2019-11-09 MED ORDER — SODIUM CHLORIDE 0.9 % IV SOLN
200.0000 mg | Freq: Once | INTRAVENOUS | Status: AC
  Administered 2019-11-09: 09:00:00 200 mg via INTRAVENOUS
  Filled 2019-11-09: qty 10

## 2019-11-09 NOTE — Patient Instructions (Signed)

## 2019-11-15 ENCOUNTER — Ambulatory Visit
Admission: RE | Admit: 2019-11-15 | Discharge: 2019-11-15 | Disposition: A | Discharge status: Home / Self Care | Payer: Medicare Other | Source: Ambulatory Visit | Attending: Family Medicine | Admitting: Family Medicine

## 2019-11-15 ENCOUNTER — Other Ambulatory Visit: Payer: Self-pay

## 2019-11-15 DIAGNOSIS — N63 Unspecified lump in unspecified breast: Secondary | ICD-10-CM

## 2019-11-15 DIAGNOSIS — N644 Mastodynia: Secondary | ICD-10-CM | POA: Diagnosis not present

## 2019-11-15 DIAGNOSIS — R928 Other abnormal and inconclusive findings on diagnostic imaging of breast: Secondary | ICD-10-CM | POA: Diagnosis not present

## 2019-11-16 ENCOUNTER — Inpatient Hospital Stay: Payer: Medicare Other

## 2019-11-16 VITALS — BP 150/68 | HR 70 | Temp 97.1°F | Resp 17 | Wt 277.1 lb

## 2019-11-16 DIAGNOSIS — D509 Iron deficiency anemia, unspecified: Secondary | ICD-10-CM

## 2019-11-16 MED ORDER — SODIUM CHLORIDE 0.9 % IV SOLN
Freq: Once | INTRAVENOUS | Status: AC
  Filled 2019-11-16: qty 250

## 2019-11-16 MED ORDER — SODIUM CHLORIDE 0.9 % IV SOLN
200.0000 mg | Freq: Once | INTRAVENOUS | Status: AC
  Administered 2019-11-16: 200 mg via INTRAVENOUS
  Filled 2019-11-16: qty 10

## 2019-11-16 NOTE — Patient Instructions (Signed)

## 2019-11-16 NOTE — Progress Notes (Signed)
Pt refused to stay for 30 minute observation. VSS.

## 2019-11-22 ENCOUNTER — Other Ambulatory Visit: Payer: Self-pay | Admitting: Family Medicine

## 2019-11-22 DIAGNOSIS — M4807 Spinal stenosis, lumbosacral region: Secondary | ICD-10-CM

## 2019-11-28 ENCOUNTER — Telehealth: Payer: Self-pay | Admitting: *Deleted

## 2019-11-28 NOTE — Telephone Encounter (Signed)
I have scheduled her to see Dr. Larose Kells for a virtual visit tomorrow-she requested you to call her when you are back in time regarding her lab tests and give you an update I offered to take the information for her but she requested to speak to you.

## 2019-11-28 NOTE — Telephone Encounter (Signed)
Copied from [place] [phone]. Topic: Quick Communication - See Telephone Encounter >> Nov 28, 2019  2:10 PM Loma Boston wrote: CRM for notification. See Telephone encounter for: 11/28/19. Pt has a really bad cold and wants Dr C to call her in medications, tried to offer a virtual but she said she would need to be referred to another dr b/c she must come in to the office. Pls FU with pt at [place] busy

## 2019-11-29 ENCOUNTER — Ambulatory Visit (INDEPENDENT_AMBULATORY_CARE_PROVIDER_SITE_OTHER): Payer: Medicare Other | Admitting: Internal Medicine

## 2019-11-29 ENCOUNTER — Other Ambulatory Visit: Payer: Self-pay

## 2019-11-29 DIAGNOSIS — B349 Viral infection, unspecified: Secondary | ICD-10-CM

## 2019-11-29 MED ORDER — AZITHROMYCIN 250 MG PO TABS: ORAL_TABLET | ORAL | 0 refills | Status: DC

## 2019-11-29 NOTE — Progress Notes (Signed)
Subjective:    Patient ID: Kathleen Miller, female    DOB: [birth year], 73 y.o.   MRN: [id number]  DOS:  11/29/2019 Type of visit - description: Attempted  to make this a video visit, due to technical difficulties from the patient side it was not possible  thus we proceeded with a Virtual Visit via Telephone    I connected with above mentioned patient  by telephone and verified that I am speaking with the correct person using two identifiers.  THIS ENCOUNTER IS A VIRTUAL VISIT DUE TO COVID-19 - PATIENT WAS NOT SEEN IN THE OFFICE. PATIENT HAS CONSENTED TO VIRTUAL VISIT / TELEMEDICINE VISIT   Location of patient: home  Location of provider: office  I discussed the limitations, risks, security and privacy concerns of performing an evaluation and management service by telephone and the availability of in person appointments. I also discussed with the patient that there may be a patient responsible charge related to this service. The patient expressed understanding and agreed to proceed.   History of Present Illness: Acute Symptoms started approximately 6 days ago with fever up to 101.0, chest and sinus congestion, cough, sneezing. The patient states "I have a cold, I do not have Covid". The fever eventually broke 2 days after. She is calling because she continue with chest congestion requests treatment. She has a history of asthma and uses her inhaler regularly.    Review of Systems Denies chest pain, shortness of breath slightly above baseline Lower extremity edema at baseline Has some nausea and diarrhea with the onset of symptoms but that is resolved.  No vomiting. No ambulatory BPs or oxygen levels.  Past Medical History:  Diagnosis Date  . Chronic back pain   . Hypothyroidism   . Pneumonia   . Pre-diabetes    takes metformin preventatively  . Ventral hernia     Past Surgical History:  Procedure Laterality Date  . ABDOMINAL HYSTERECTOMY  11/2011  . CHOLECYSTECTOMY   1988  . COLONOSCOPY WITH PROPOFOL N/A 05/27/2016   Procedure: COLONOSCOPY WITH PROPOFOL;  Surgeon: Wilford Corner, MD;  Location: [facility];  Service: Endoscopy;  Laterality: N/A;  . DILATION AND CURETTAGE OF UTERUS     x2  . ESOPHAGOGASTRODUODENOSCOPY (EGD) WITH PROPOFOL N/A 05/27/2016   Procedure: ESOPHAGOGASTRODUODENOSCOPY (EGD) WITH PROPOFOL;  Surgeon: Wilford Corner, MD;  Location: [facility];  Service: Endoscopy;  Laterality: N/A;  . GASTRIC BYPASS    . GASTRIC ROUX-EN-Y N/A 12/13/2017   Procedure: LAPAROSCOPIC ASSTED VENTRAL HERNIA REPAIR, Upper Endo;  Surgeon: Excell Seltzer, MD;  Location: [facility];  Service: General;  Laterality: N/A;  With MESH  . LEFT HEART CATH AND CORONARY ANGIOGRAPHY N/A 08/05/2017   Procedure: LEFT HEART CATH AND CORONARY ANGIOGRAPHY;  Surgeon: Martinique, Peter M, MD;  Location: [facility];  Service: Cardiovascular;  Laterality: N/A;  . revision gastric bypass     and ventrel hernia repair  Dr. Excell Seltzer 12-13-17  . SPLENECTOMY, TOTAL  1988    Social History   Socioeconomic History  . Marital status: Divorced    Spouse name: Not on file  . Number of children: Not on file  . Years of education: Not on file  . Highest education level: Not on file  Occupational History  . Not on file  Tobacco Use  . Smoking status: Never Smoker  . Smokeless tobacco: Never Used  Substance and Sexual Activity  . Alcohol use: No  . Drug use: No  . Sexual activity: Never  Other Topics Concern  . Not on file  Social History Narrative  . Not on file   Social Determinants of Health   Financial Resource Strain:   . Difficulty of Paying Living Expenses: Not on file  Food Insecurity:   . Worried About Charity fundraiser in the Last Year: Not on file  . Ran Out of Food in the Last Year: Not on file  Transportation Needs:   . Lack of Transportation (Medical): Not on file  . Lack of Transportation (Non-Medical): Not on file  Physical Activity:   . Days of  Exercise per Week: Not on file  . Minutes of Exercise per Session: Not on file  Stress:   . Feeling of Stress : Not on file  Social Connections:   . Frequency of Communication with Friends and Family: Not on file  . Frequency of Social Gatherings with Friends and Family: Not on file  . Attends Religious Services: Not on file  . Active Member of Clubs or Organizations: Not on file  . Attends Archivist Meetings: Not on file  . Marital Status: Not on file  Intimate Partner Violence:   . Fear of Current or Ex-Partner: Not on file  . Emotionally Abused: Not on file  . Physically Abused: Not on file  . Sexually Abused: Not on file      Allergies as of 11/29/2019      Reactions   Nsaids Swelling, Other (See Comments)   Severe swelling--water retention (including water on brain)   Tolmetin Swelling, Other (See Comments)   Severe swelling-water retention (including water on brain)      Medication List       Accurate as of November 29, 2019 10:48 AM. If you have any questions, ask your nurse or doctor.        albuterol 108 (90 Base) MCG/ACT inhaler Commonly known as: ProAir HFA Inhale 2 puffs into the lungs every 6 (six) hours as needed for wheezing or shortness of breath.   apixaban 5 MG Tabs tablet Commonly known as: Eliquis Take 1 tablet (5 mg total) by mouth 2 (two) times daily.   cyclobenzaprine 10 MG tablet Commonly known as: FLEXERIL TAKE 1/2 TO 1 (ONE-HALF TO ONE) TABLET BY MOUTH THREE TIMES DAILY AS NEEDED FOR MUSCLE SPASM   furosemide 20 MG tablet Commonly known as: LASIX TAKE 2 TABLETS BY MOUTH ONCE DAILY. ADJUST DOSE AS RECOMMENDED BY MD   lovastatin 20 MG tablet Commonly known as: MEVACOR Take 1 tablet (20 mg total) by mouth at bedtime.   metFORMIN 500 MG tablet Commonly known as: GLUCOPHAGE Take 1 tablet (500 mg total) by mouth 2 (two) times daily with a meal.   thyroid 60 MG tablet Commonly known as: NP Thyroid Take 1 tablet (60 mg total) by  mouth daily before breakfast.   traMADol 50 MG tablet Commonly known as: ULTRAM Take 1 tablet (50 mg total) by mouth every 6 (six) hours as needed for moderate pain.           Objective:   Physical Exam There were no vitals taken for this visit. The patient is alert oriented x3, in no apparent distress, she is speaking in complete sentences and is clear of mind.    Assessment    Seven 73 year old female, PMH includes HTN, diabetes, CHF, morbid obesity s/p gastric bypass, 11-2017: Bilateral PE, anticoagulated, presents with:  Viral syndrome: In no uncertain terms I told the patient that these definitely could be  COVID-19 and she could get very sick, recommend to be tested. Also recommend to go home to protect other people at his job (she is currently working), offered a work excuse. She strongly refuse to be tested or going home, she laughed and said "I know I do not have the virus". At this point I do not have much alternative but recommend the following: Rest, fluids, Tylenol, Mucinex DM. Will call Z-Pak Declined prednisone Strongly advised to seek medical attention if she is not improving in the next 36 hours or if she gets worse.    I discussed the assessment and treatment plan with the patient. The patient was provided an opportunity to ask questions and all were answered. The patient agreed with the plan and demonstrated an understanding of the instructions.   The patient was advised to call back or seek an in-person evaluation if the symptoms worsen or if the condition fails to improve as anticipated.  I provided 17 minutes of non-face-to-face time during this encounter.  Kathlene November, MD

## 2019-12-03 NOTE — Telephone Encounter (Signed)
Called pt and LMOM for her, please let me know if you need anything further JC

## 2019-12-11 ENCOUNTER — Telehealth: Payer: Self-pay | Admitting: *Deleted

## 2019-12-11 NOTE — Telephone Encounter (Signed)
Copied from [place] [id number]. Topic: General - Call Back - No Documentation >> Dec 11, 2019 12:19 PM [name] wrote: Reason for CRM: Pt would like a call back to discuss the vaccine  [phone]

## 2020-01-17 ENCOUNTER — Inpatient Hospital Stay: Payer: Medicare HMO | Admitting: Hematology

## 2020-01-17 ENCOUNTER — Inpatient Hospital Stay: Payer: Medicare HMO

## 2020-01-27 DIAGNOSIS — Z9884 Bariatric surgery status: Secondary | ICD-10-CM | POA: Insufficient documentation

## 2020-01-27 NOTE — Progress Notes (Signed)
[facility] [address] [place], [place] [address] [phone]  Date:  01/28/2020   Name:  Kathleen Miller   DOB:  [birth year]   MRN:  [id number]  PCP:  Darreld Mclean, MD    Chief Complaint: Leg Pain (bilateral leg pain, worse in left leg, trouble supporting, falls often ) and Blood Clots (worsening, nose bleeds)   History of Present Illness:  Kathleen GUYSE is a 74 y.o. very pleasant female patient who presents with the following:  Today for a follow-up visit and with concern of pain in legs Chau has history of heart failure, hypertension, spinal stenosis, diabetes, dyslipidemia, history of gastric bypass in the 1980s, microcytic anemia  She was seen by my partner Dr. Larose Kells in December with viral syndrome-virtual visit I last saw her in September She is under the care of hematology/oncology-Dr. Benjaman Lobe history of bilateral pulmonary embolism She is on Eliquis-plan for lifelong anticoagulation Due to extensiveness of previous clot burden, they recommend continuing 5 mg of Eliquis twice daily instead of reducing dose to 2.5  Eye exam Tetanus vaccine Can update A1c, TSH today-labs are otherwise up-to-date  Lab Results  Component Value Date   HGBA1C 6.6 (H) 08/23/2019    Albuterol Eliquis 5 twice daily Lasix Lovastatin Metformin 500 twice daily Thyroid replacement Tramadol  She notes that her left leg has gotten more painful, and her knee may "give out from under me" Both knees are painful but the left is worse than the right Her legs may feel like they are going numb at times  She is using a walker to get around the clinic today She is working at [employer] now- this is too hard for her to do, too much standing.  She has a chair but cannot really sit and do her job She would like a note to do seated work at her job  She works at [employer] She would love to retire but cannot afford to do  so   She has not had any x-rays of her knees as of yet   She has noted sx of a sinus infection for about one year- nasal congestion- "it just keep getting worse"  She is using a nasal decongestant -admits that she has been using this daily for some time now  Advised her that she may have rhinitis medicamentosa, we discussed need to limit use of nasal decongestant  Patient Active Problem List   Diagnosis Date Noted  . History of Roux-en-Y gastric bypass 01/27/2020  . Microcytic anemia 10/11/2019  . Pressure injury of skin 12/20/2017  . Pulmonary embolism ([place]) 12/17/2017  . ARF (acute renal failure) ([facility]) 12/17/2017  . Incisional hernia 12/13/2017  . Chest pain 08/05/2017  . Abnormal nuclear stress test 08/05/2017  . Low bone mass 02/23/2017  . Chronic pain syndrome 07/18/2016  . Dyslipidemia 07/12/2016  . GERD (gastroesophageal reflux disease) 05/27/2016  . Slow transit constipation 06/06/2014  . Chronic combined systolic and diastolic heart failure ([place]) 12/04/2013  . Essential hypertension, benign 12/04/2013  . Spinal stenosis of lumbar region 12/04/2013  . Morbid obesity ([place]) 08/04/2013  . Acute combined systolic and diastolic congestive heart failure ([place]) 08/02/2013  . Type II or unspecified type diabetes mellitus without mention of complication, uncontrolled 08/02/2013    Past Medical History:  Diagnosis Date  . Chronic back pain   . Hypothyroidism   . Pneumonia   .  Pre-diabetes    takes metformin preventatively  . Ventral hernia     Past Surgical History:  Procedure Laterality Date  . ABDOMINAL HYSTERECTOMY  11/2011  . CHOLECYSTECTOMY  1988  . COLONOSCOPY WITH PROPOFOL N/A 05/27/2016   Procedure: COLONOSCOPY WITH PROPOFOL;  Surgeon: Wilford Corner, MD;  Location: [facility];  Service: Endoscopy;  Laterality: N/A;  . DILATION AND CURETTAGE OF UTERUS     x2  . ESOPHAGOGASTRODUODENOSCOPY (EGD) WITH PROPOFOL N/A 05/27/2016   Procedure: ESOPHAGOGASTRODUODENOSCOPY  (EGD) WITH PROPOFOL;  Surgeon: Wilford Corner, MD;  Location: [facility];  Service: Endoscopy;  Laterality: N/A;  . GASTRIC BYPASS    . GASTRIC ROUX-EN-Y N/A 12/13/2017   Procedure: LAPAROSCOPIC ASSTED VENTRAL HERNIA REPAIR, Upper Endo;  Surgeon: Excell Seltzer, MD;  Location: [facility];  Service: General;  Laterality: N/A;  With MESH  . LEFT HEART CATH AND CORONARY ANGIOGRAPHY N/A 08/05/2017   Procedure: LEFT HEART CATH AND CORONARY ANGIOGRAPHY;  Surgeon: Martinique, Peter M, MD;  Location: [facility];  Service: Cardiovascular;  Laterality: N/A;  . revision gastric bypass     and ventrel hernia repair  Dr. Excell Seltzer 12-13-17  . SPLENECTOMY, TOTAL  1988    Social History   Tobacco Use  . Smoking status: Never Smoker  . Smokeless tobacco: Never Used  Substance Use Topics  . Alcohol use: No  . Drug use: No    History reviewed. No pertinent family history.  Allergies  Allergen Reactions  . Nsaids Swelling and Other (See Comments)    Severe swelling--water retention (including water on brain)  . Tolmetin Swelling and Other (See Comments)    Severe swelling-water retention (including water on brain)     Medication list has been reviewed and updated.  Current Outpatient Medications on File Prior to Visit  Medication Sig Dispense Refill  . albuterol (PROAIR HFA) 108 (90 Base) MCG/ACT inhaler Inhale 2 puffs into the lungs every 6 (six) hours as needed for wheezing or shortness of breath. 18 g 5  . furosemide (LASIX) 20 MG tablet TAKE 2 TABLETS BY MOUTH ONCE DAILY. ADJUST DOSE AS RECOMMENDED BY MD 180 tablet 1  . lovastatin (MEVACOR) 20 MG tablet Take 1 tablet (20 mg total) by mouth at bedtime. 90 tablet 3  . metFORMIN (GLUCOPHAGE) 500 MG tablet Take 1 tablet (500 mg total) by mouth 2 (two) times daily with a meal. 180 tablet 3  . thyroid (NP THYROID) 60 MG tablet Take 1 tablet (60 mg total) by mouth daily before breakfast. 90 tablet 3  . traMADol (ULTRAM) 50 MG tablet Take 1  tablet (50 mg total) by mouth every 6 (six) hours as needed for moderate pain. 30 tablet 0  . apixaban (ELIQUIS) 5 MG TABS tablet Take 1 tablet (5 mg total) by mouth 2 (two) times daily. 60 tablet 5   No current facility-administered medications on file prior to visit.    Review of Systems:  As per HPI- otherwise negative.  No fever or chills Physical Examination: Vitals:   01/28/20 1421  BP: 126/88  Pulse: 81  Resp: 18  Temp: 98 F (36.7 C)  SpO2: 98%   Vitals:   01/28/20 1421  Weight: 271 lb (122.9 kg)  Height: 5\' 4"  (1.626 m)   Body mass index is 46.52 kg/m. Ideal Body Weight: Weight in (lb) to have BMI = 25: 145.3  GEN: no acute distress. Obese, appears her normal self HEENT: Atraumatic, Normocephalic.  Ears and Nose: No external deformity. CV: RRR,  No M/G/R. No JVD. No thrill. No extra heart sounds. PULM: CTA B, no wheezes, crackles, rhonchi. No retractions. No resp. distress. No accessory muscle use. ABD: S, NT, ND, +BS. No rebound. No HSM. EXTR: No c/c/e PSYCH: Normally interactive. Conversant.  Using a walker to ambulate She expresses pain with range of motion of both knees. Both knees have likely a small effusion. No redness or heat, no sign of acute joint infection   Assessment and Plan: Anticoagulant long-term use  History of Roux-en-Y gastric bypass  Controlled type 2 diabetes mellitus without complication, without long-term current use of insulin (HCC)  Acquired hypothyroidism  Morbid obesity (HCC)  Essential hypertension, benign  Chronic pain of both knees - Plan: DG Knee Complete 4 Views Left, DG Knee Complete 4 Views Right  Acute non-recurrent frontal sinusitis - Plan: amoxicillin (AMOXIL) 500 MG capsule  Rhinitis medicamentosa  Here today for follow-up visit Gave samples of Eliquis 5 mg sufficient for 8 weeks She prefers not to do labs today, her most recent A1c and TSH are within acceptable timeframe Blood pressure well  controlled Discussed her knee pain. She tends to blame her leg pain on wearing too tight compression socks during the surgical procedure a few years ago. However, advised her that she may have significant arthritis in her knees and I would like to get films. She is agreeable I also gave her a work note, giving her the rest of this week off to rest and then requesting that she do primarily seated work Discussed her sinus symptoms. She has noted nasal congestion for about a year. However, she has been using a nasal decongestant spray chronically. I explained how this can actually worsen congestion, and advised her to switch to a nasal steroid spray such as Flonase. I will give her a course of amoxicillin to use if needed for sinus infection symptoms  Moderate medical decision making today This visit occurred during the SARS-CoV-2 public health emergency.  Safety protocols were in place, including screening questions prior to the visit, additional usage of staff PPE, and extensive cleaning of exam room while observing appropriate contact time as indicated for disinfecting solutions.   Signed Lamar Blinks, MD  Received her knee films as follows, letter to patient  DG Knee Complete 4 Views Left  Result Date: 01/29/2020 CLINICAL DATA:  Knee pain.  No known injury. EXAM: LEFT KNEE - COMPLETE 4+ VIEW COMPARISON:  MRI left knee 07/14/2018.  Left knee series 01/29/2013. FINDINGS: Diffuse tricompartment degenerative change again noted. Degenerative changes most prominent about the medial and patellofemoral compartments. No evidence of fracture or dislocation. Small knee joint effusion cannot be excluded. IMPRESSION: Diffuse tricompartment degenerative change again noted. Degenerative changes most prominent about the medial and patellofemoral compartments. Degenerative changes have progressed from prior left knee series of 01/29/2013. No acute abnormality. Electronically Signed   By: Marcello Moores  Register   On:  01/29/2020 06:29   DG Knee Complete 4 Views Right  Result Date: 01/29/2020 CLINICAL DATA:  Knee pain.  No injury. EXAM: RIGHT KNEE - COMPLETE 4+ VIEW COMPARISON:  01/29/2013. FINDINGS: Diffuse tricompartment degenerative change. Degenerative changes most prominent about the medial and patellofemoral compartments. Loose bodies most likely present. Degenerative changes have progressed from prior right knee series of 01/29/2013. No acute bony abnormality identified. No evidence of effusion. IMPRESSION: Diffuse tricompartment degenerative change. Degenerative changes most prior about the medial patellofemoral compartments. Loose bodies most likely present. Degenerative changes have progressed from prior right knee series of 01/29/2013. No  acute bony abnormality identified. Electronically Signed   By: Marcello Moores  Register   On: 01/29/2020 06:31

## 2020-01-28 ENCOUNTER — Encounter: Payer: Self-pay | Admitting: Family Medicine

## 2020-01-28 ENCOUNTER — Ambulatory Visit (INDEPENDENT_AMBULATORY_CARE_PROVIDER_SITE_OTHER): Payer: Medicare HMO | Admitting: Family Medicine

## 2020-01-28 ENCOUNTER — Ambulatory Visit: Payer: Medicare HMO | Attending: Internal Medicine

## 2020-01-28 ENCOUNTER — Other Ambulatory Visit: Payer: Self-pay

## 2020-01-28 ENCOUNTER — Ambulatory Visit (HOSPITAL_BASED_OUTPATIENT_CLINIC_OR_DEPARTMENT_OTHER)
Admission: RE | Admit: 2020-01-28 | Discharge: 2020-01-28 | Disposition: A | Discharge status: Home / Self Care | Payer: Medicare HMO | Source: Ambulatory Visit | Attending: Family Medicine | Admitting: Family Medicine

## 2020-01-28 VITALS — BP 126/88 | HR 81 | Temp 98.0°F | Resp 18 | Ht 64.0 in | Wt 271.0 lb

## 2020-01-28 DIAGNOSIS — Z23 Encounter for immunization: Secondary | ICD-10-CM

## 2020-01-28 DIAGNOSIS — Z7901 Long term (current) use of anticoagulants: Secondary | ICD-10-CM | POA: Diagnosis not present

## 2020-01-28 DIAGNOSIS — I10 Essential (primary) hypertension: Secondary | ICD-10-CM | POA: Diagnosis not present

## 2020-01-28 DIAGNOSIS — G8929 Other chronic pain: Secondary | ICD-10-CM

## 2020-01-28 DIAGNOSIS — E119 Type 2 diabetes mellitus without complications: Secondary | ICD-10-CM

## 2020-01-28 DIAGNOSIS — Z9884 Bariatric surgery status: Secondary | ICD-10-CM

## 2020-01-28 DIAGNOSIS — M25561 Pain in right knee: Secondary | ICD-10-CM | POA: Insufficient documentation

## 2020-01-28 DIAGNOSIS — J0110 Acute frontal sinusitis, unspecified: Secondary | ICD-10-CM

## 2020-01-28 DIAGNOSIS — M25562 Pain in left knee: Secondary | ICD-10-CM | POA: Diagnosis not present

## 2020-01-28 DIAGNOSIS — E039 Hypothyroidism, unspecified: Secondary | ICD-10-CM | POA: Diagnosis not present

## 2020-01-28 DIAGNOSIS — M1712 Unilateral primary osteoarthritis, left knee: Secondary | ICD-10-CM | POA: Diagnosis not present

## 2020-01-28 DIAGNOSIS — M1711 Unilateral primary osteoarthritis, right knee: Secondary | ICD-10-CM | POA: Diagnosis not present

## 2020-01-28 DIAGNOSIS — J310 Chronic rhinitis: Secondary | ICD-10-CM

## 2020-01-28 DIAGNOSIS — T485X5A Adverse effect of other anti-common-cold drugs, initial encounter: Secondary | ICD-10-CM

## 2020-01-28 MED ORDER — AMOXICILLIN 500 MG PO CAPS: 1000.0000 mg | ORAL_CAPSULE | Freq: Two times a day (BID) | ORAL | 0 refills | Status: DC

## 2020-01-28 NOTE — Progress Notes (Signed)
Patient in office today received samples of 5 mg eliquis. She received 8 boxed. Log has been filled out in sample book.   Eliquis 5 mg po daily. Lot Number [id number] EXP 11/2021

## 2020-01-28 NOTE — Patient Instructions (Addendum)
Good to see you again today, I will be in touch with your x-ray reports asap We gave you 8 boxes of Eliquis 5 mg today- 15 pills each  Please stop by the ground floor x-ray dept on the way out to have films of your knees  Please stop using the nasal decongestant stray- this is likely making your nasal congestion worse.  You can try a nasal steroid such as flonase I prescribed amoxicillin for you to take for 10 days for sinus infection

## 2020-01-28 NOTE — Progress Notes (Signed)
   Covid-19 Vaccination Clinic  Name:  Kathleen Miller    MRN: [id number] DOB: Dec 25, 1945  01/28/2020  [name] was observed post Covid-19 immunization for 15 minutes without incidence. She was provided with Vaccine Information Sheet and instruction to access the V-Safe system.   [name] was instructed to call 911 with any severe reactions post vaccine: Marland Kitchen Difficulty breathing  . Swelling of your face and throat  . A fast heartbeat  . A bad rash all over your body  . Dizziness and weakness    Immunizations Administered    Name Date Dose VIS Date Route   Pfizer COVID-19 Vaccine 01/28/2020  8:42 AM 0.3 mL 11/09/2019 Intramuscular   Manufacturer: [place]   Lot: [id number]   [place]: [id number]

## 2020-01-30 ENCOUNTER — Telehealth: Payer: Self-pay

## 2020-01-30 DIAGNOSIS — M1710 Unilateral primary osteoarthritis, unspecified knee: Secondary | ICD-10-CM

## 2020-01-30 NOTE — Telephone Encounter (Signed)
Called patient and dicussed knee films showing significant arthritis, advised she should see Ortho ASAP per Dr. Lorelei Pont. She agreed and is willing to see them. She did however prefer a woman to see her. I explained we will try but sometimes this can be difficult. She agreed to see a man if absolutely necessary. I have pended referral.

## 2020-02-08 ENCOUNTER — Inpatient Hospital Stay: Payer: Medicare HMO | Attending: Hematology

## 2020-02-08 ENCOUNTER — Inpatient Hospital Stay: Payer: Medicare HMO | Admitting: Hematology

## 2020-02-09 ENCOUNTER — Other Ambulatory Visit: Payer: Self-pay | Admitting: Family Medicine

## 2020-02-09 DIAGNOSIS — E785 Hyperlipidemia, unspecified: Secondary | ICD-10-CM

## 2020-02-09 DIAGNOSIS — E039 Hypothyroidism, unspecified: Secondary | ICD-10-CM

## 2020-02-09 DIAGNOSIS — E119 Type 2 diabetes mellitus without complications: Secondary | ICD-10-CM

## 2020-02-26 ENCOUNTER — Ambulatory Visit: Payer: Medicare HMO | Attending: Internal Medicine

## 2020-02-26 DIAGNOSIS — Z23 Encounter for immunization: Secondary | ICD-10-CM

## 2020-02-26 NOTE — Progress Notes (Signed)
   Covid-19 Vaccination Clinic  Name:  Kathleen Miller    MRN: [id number] DOB: 05-Oct-1946  02/26/2020  Ms. Lovel was observed post Covid-19 immunization for 30 minutes based on pre-vaccination screening without incident. She was provided with Vaccine Information Sheet and instruction to access the V-Safe system.   Ms. Matsuno was instructed to call 911 with any severe reactions post vaccine: Marland Kitchen Difficulty breathing  . Swelling of face and throat  . A fast heartbeat  . A bad rash all over body  . Dizziness and weakness   Immunizations Administered    Name Date Dose VIS Date Route   Pfizer COVID-19 Vaccine 02/26/2020  8:50 AM 0.3 mL 11/09/2019 Intramuscular   Manufacturer: [place]   Lot: [id number]   [place]: [id number]

## 2020-05-20 DIAGNOSIS — E039 Hypothyroidism, unspecified: Secondary | ICD-10-CM | POA: Insufficient documentation

## 2020-05-20 NOTE — Progress Notes (Signed)
Subjective:   Kathleen Miller is a 74 y.o. female who presents for Medicare Annual (Subsequent) preventive examination.  Review of Systems    Cardiac Risk Factors include: advanced age (>45men, >17 women);dyslipidemia;hypertension;obesity (BMI >30kg/m2)     Objective:    Today's Vitals   05/21/20 1107  BP: 132/80  Pulse: 71  Temp: 98.3 F (36.8 C)  TempSrc: Temporal  SpO2: 96%  Weight: 261 lb (118.4 kg)  Height: 5\' 4"  (1.626 m)   Body mass index is 44.8 kg/m.  Advanced Directives 05/21/2020 10/11/2019 02/15/2019 12/17/2017 12/13/2017 12/12/2017 11/04/2017  Does Patient Have a Medical Advance Directive? No No No No No No No  Does patient want to make changes to medical advance directive? - - - - - - -  Would patient like information on creating a medical advance directive? No - Patient declined No - Patient declined No - Patient declined No - Patient declined No - Patient declined No - Patient declined Yes (MAU/Ambulatory/Procedural Areas - Information given)  Pre-existing out of facility DNR order (yellow form or pink MOST form) - - - - - - -    Current Medications (verified) Outpatient Encounter Medications as of 05/21/2020  Medication Sig   albuterol (PROAIR HFA) 108 (90 Base) MCG/ACT inhaler Inhale 2 puffs into the lungs every 6 (six) hours as needed for wheezing or shortness of breath.   amoxicillin (AMOXIL) 500 MG capsule Take 2 capsules (1,000 mg total) by mouth 2 (two) times daily.   furosemide (LASIX) 20 MG tablet TAKE 2 TABLETS BY MOUTH ONCE DAILY. ADJUST DOSE AS RECOMMENDED BY MD   lovastatin (MEVACOR) 20 MG tablet TAKE 1 TABLET BY MOUTH AT BEDTIME   metFORMIN (GLUCOPHAGE) 500 MG tablet TAKE 1 TABLET BY MOUTH TWICE DAILY WITH A MEAL   NP THYROID 60 MG tablet TAKE 1 TABLET BY MOUTH ONCE DAILY BEFORE BREAKFAST   traMADol (ULTRAM) 50 MG tablet Take 1 tablet (50 mg total) by mouth every 6 (six) hours as needed for moderate pain.   apixaban (ELIQUIS) 5 MG TABS  tablet Take 1 tablet (5 mg total) by mouth 2 (two) times daily.   No facility-administered encounter medications on file as of 05/21/2020.    Allergies (verified) Nsaids and Tolmetin   History: Past Medical History:  Diagnosis Date   Chronic back pain    Hypothyroidism    Pneumonia    Pre-diabetes    takes metformin preventatively   Ventral hernia    Past Surgical History:  Procedure Laterality Date   ABDOMINAL HYSTERECTOMY  11/2011   CHOLECYSTECTOMY  1988   COLONOSCOPY WITH PROPOFOL N/A 05/27/2016   Procedure: COLONOSCOPY WITH PROPOFOL;  Surgeon: Wilford Corner, MD;  Location[facility];  Service: Endoscopy;  Laterality: N/A;   DILATION AND CURETTAGE OF UTERUS     x2   ESOPHAGOGASTRODUODENOSCOPY (EGD) WITH PROPOFOL N/A 05/27/2016   Procedure: ESOPHAGOGASTRODUODENOSCOPY (EGD) WITH PROPOFOL;  Surgeon: Wilford Corner, MD;  [facility];  Service: Endoscopy;  Laterality: N/A;   GASTRIC BYPASS     GASTRIC ROUX-EN-Y N/A 12/13/2017   Procedure: LAPAROSCOPIC ASSTED VENTRAL HERNIA REPAIR, Upper Endo;  Surgeon: Excell Seltzer, MD;  [facility];  Service: General;  Laterality: N/A;  With MESH   LEFT HEART CATH AND CORONARY ANGIOGRAPHY N/A 08/05/2017   Procedure: LEFT HEART CATH AND CORONARY ANGIOGRAPHY;  Surgeon: Martinique, Peter M, MD;  Location: [facility];  Service: Cardiovascular;  Laterality: N/A;   revision gastric bypass  and ventrel hernia repair  Dr. Excell Seltzer 12-13-17   SPLENECTOMY, TOTAL  1988   History reviewed. No pertinent family history. Social History   Socioeconomic History   Marital status: Divorced    Spouse name: Not on file   Number of children: Not on file   Years of education: Not on file   Highest education level: Not on file  Occupational History   Not on file  Tobacco Use   Smoking status: Never Smoker   Smokeless tobacco: Never Used  Vaping Use   Vaping Use: Never used  Substance and Sexual Activity     Alcohol use: No   Drug use: No   Sexual activity: Never  Other Topics Concern   Not on file  Social History Narrative   Not on file   Social Determinants of Health   Financial Resource Strain: Medium Risk   Difficulty of Paying Living Expenses: Somewhat hard  Food Insecurity: No Food Insecurity   Worried About Running Out of Food in the Last Year: Never true   Ran Out of Food in the Last Year: Never true  Transportation Needs: No Transportation Needs   Lack of Transportation (Medical): No   Lack of Transportation (Non-Medical): No  Physical Activity:    Days of Exercise per Week:    Minutes of Exercise per Session:   Stress:    Feeling of Stress :   Social Connections:    Frequency of Communication with Friends and Family:    Frequency of Social Gatherings with Friends and Family:    Attends Religious Services:    Active Member of Clubs or Organizations:    Attends Archivist Meetings:    Marital Status:     Tobacco Counseling Counseling given: Not Answered   Clinical Intake:  Pain : No/denies pain   Activities of Daily Living In your present state of health, do you have any difficulty performing the following activities: 05/21/2020  Hearing? N  Vision? N  Difficulty concentrating or making decisions? N  Walking or climbing stairs? N  Dressing or bathing? N  Doing errands, shopping? N  Preparing Food and eating ? N  Using the Toilet? N  In the past six months, have you accidently leaked urine? N  Do you have problems with loss of bowel control? N  Managing your Medications? N  Managing your Finances? N  Housekeeping or managing your Housekeeping? N  Some recent data might be hidden    Patient Care Team: Copland, Gay Filler, MD as PCP - General (Family Medicine) Dalton-Bethea, Fabio Asa, MD as Consulting Physician (Physical Medicine and Rehabilitation)  Indicate any recent Medical Services you may have received from other than  [facility] providers in the past year (date may be approximate).     Assessment:   This is a routine wellness examination for [facility].  Dietary issues and exercise activities discussed: Current Exercise Habits: The patient does not participate in regular exercise at present, Exercise limited by: Other - see comments (difficulty walking) Diet (meal preparation, eat out, water intake, caffeinated beverages, dairy products, fruits and vegetables): well balanced  Goals     Increase water intake     Walk on treadmill      Depression Screen PHQ 2/9 Scores 05/21/2020 01/26/2017 12/04/2013  PHQ - 2 Score 1 2 4   PHQ- 9 Score 3 8 25     Fall Risk Fall Risk  05/21/2020 01/28/2020 01/26/2017 11/11/2016 12/04/2013  Falls in the past year? 0 1 Yes Yes  Yes  Number falls in past yr: 0 1 2 or more 1 2 or more  Comment - patient states 2 dozen - - -  Injury with Fall? 0 0 No Yes -  Comment - - - Foot got stuck between  boxes and fell. Hurt knee, and forehead. -  Risk Factor Category  - - High Fall Risk - -  Risk for fall due to : - History of fall(s);Impaired balance/gait History of fall(s);Impaired mobility - Impaired balance/gait;Impaired mobility;History of fall(s)  Risk for fall due to: Comment - - back pain - -  Follow up Education provided;Falls prevention discussed - Falls prevention discussed;Education provided - -    Any stairs in or around the home? No  If so, are there any without handrails? No  Home free of loose throw rugs in walkways, pet beds, electrical cords, etc? Yes  Adequate lighting in your home to reduce risk of falls? Yes   ASSISTIVE DEVICES UTILIZED TO PREVENT FALLS:  Life alert? No  Use of a cane, walker or w/c? Yes  Grab bars in the bathroom? Yes  Shower chair or bench in shower? No  Elevated toilet seat or a handicapped toilet? No     Cognitive Function: Ad8 score reviewed for issues:  Issues making decisions:no  Less interest in hobbies / activities:no  Repeats  questions, stories (family complaining):no  Trouble using ordinary gadgets (microwave, computer, phone):no  Forgets the month or year: no  Mismanaging finances: no  Remembering appts:no  Daily problems with thinking and/or memory:no Ad8 score is=0         Immunizations Immunization History  Administered Date(s) Administered   Fluad Quad(high Dose 65+) 08/23/2019   Influenza Split 08/11/2016   Influenza, High Dose Seasonal PF 12/14/2017   Influenza,inj,Quad PF,6+ Mos 11/26/2014   PFIZER SARS-COV-2 Vaccination 01/28/2020, 02/26/2020   Pneumococcal Conjugate-13 01/26/2017   Pneumococcal Polysaccharide-23 08/02/2013, 11/26/2014    TDAP status: Due, Education has been provided regarding the importance of this vaccine. Advised may receive this vaccine at local pharmacy or Health Dept. Aware to provide a copy of the vaccination record if obtained from local pharmacy or Health Dept. Verbalized acceptance and understanding. Flu Vaccine status: Up to date Pneumococcal vaccine status: Up to date Covid-19 vaccine status: Completed vaccines   Screening Tests Health Maintenance  Topic Date Due   TETANUS/TDAP  Never done   OPHTHALMOLOGY EXAM  02/10/2018   HEMOGLOBIN A1C  02/20/2020   INFLUENZA VACCINE  06/29/2020   FOOT EXAM  08/22/2020   URINE MICROALBUMIN  08/22/2020   MAMMOGRAM  11/14/2021   COLONOSCOPY  05/27/2026   DEXA SCAN  Completed   COVID-19 Vaccine  Completed   Hepatitis C Screening  Completed   PNA vac Low Risk Adult  Completed    Health Maintenance  Health Maintenance Due  Topic Date Due   TETANUS/TDAP  Never done   OPHTHALMOLOGY EXAM  02/10/2018   HEMOGLOBIN A1C  02/20/2020    Colorectal cancer screening: Completed 05/25/16. Repeat every (no recall on file) years Mammogram status: Completed 10/30/19. Repeat every year Bone Density Scan: 02/22/17  Lung Cancer Screening: (Low Dose CT Chest recommended if Age 43-80 years, 30 pack-year  currently smoking OR have quit w/in 15years.) does not qualify.   Additional Screening:  Hepatitis C Screening: Completed 08/12/16  Vision Screening: Recommended annual ophthalmology exams for early detection of glaucoma and other disorders of the eye. Is the patient up to date with their annual eye exam?  No .  Pt states she will schedule soon.    Dental Screening: Recommended annual dental exams for proper oral hygiene  Community Resource Referral / Chronic Care Management: CRR required this visit?  No   CCM required this visit?  No      Plan:    Please schedule your next medicare wellness visit with me in 1 yr.  Continue to eat heart healthy diet (full of fruits, vegetables, whole grains, lean protein, water--limit salt, fat, and sugar intake) and increase physical activity as tolerated.  Continue doing brain stimulating activities (puzzles, reading, adult coloring books, staying active) to keep memory sharp.     I have personally reviewed and noted the following in the patients chart:    Medical and social history  Use of alcohol, tobacco or illicit drugs   Current medications and supplements  Functional ability and status  Nutritional status  Physical activity  Advanced directives  List of other physicians  Hospitalizations, surgeries, and ER visits in previous 12 months  Vitals  Screenings to include cognitive, depression, and falls  Referrals and appointments  In addition, I have reviewed and discussed with patient certain preventive protocols, quality metrics, and best practice recommendations. A written personalized care plan for preventive services as well as general preventive health recommendations were provided to patient.     Shela Nevin, [place]   05/21/2020   Nurse Notes: Still works part time as Glass blower/designer.

## 2020-05-20 NOTE — Progress Notes (Addendum)
[facility] [place], [place], [place] [address] [phone]  Date:  05/21/2020   Name:  Kathleen Miller   DOB:  Nov 10, 1946   MRN:  [id number]  PCP:  Darreld Mclean, MD    Chief Complaint: wants to see if she qualify for disability   History of Present Illness:  Kathleen Miller is a 74 y.o. very pleasant female patient who presents with the following:  Patient here today for follow-up visit Last seen by myself in March of this year Kathleen Miller has history of heart failure, hypertension, spinal stenosis, diabetes, dyslipidemia, history of gastric bypass in the 1980s, microcytic anemia, Eliquis lifelong for history of blood clotting, hypothyroidism Pt notes that her legs may give out from under her, it is getting harder for her to stand She lives alone and has difficulty getting up from a fall sometimes, she does not always have her phone with her She is still working, notes that her work is concerned she may have a fall on their premises She wonders if she can get disability benefits somehow which would enable her to retire.  I discussed this with her today, explained that I am not an expert in these matters.  I would suggest that she consult with her reputable disability lawyer in case they may be able to help her  She had been seeing ortho for her spinal stenosis- Dr Ermalene Postin. However she reports that she cannot afford to see him any longer, medicare does not cover her visits.  I encouraged her to check on this, because typically Medicare will cover this type of visit.  She agrees to do so Per her most recent visit with Dr. Ermalene Postin Impression: 73 year old female with left lower extremity pain the best explanation have is that this is a combination of problems part of it due to her left knee degenerative changes the other could be due to her severe spinal stenosis at L4/5. We will asked patient to see a neurosurgeon about the  stenosis she prefer to see a female physician after I went through several options she would like to see Dr. Katherine Roan. To continue with the rehab specialist and we discussed possibly giving orthopedist. Requested a of Medicare physicians.   She has not seen her cardiologist in about 3 years, I encouraged her to schedule a follow-up visit  Eye exam tdap A1c, labs update today covid series done Shingrix- she is not sure   Eliquis-  Lasix Lovastatin Metformin Thyroid replacement Tramadol as needed  Lab Results  Component Value Date   HGBA1C 6.6 (H) 08/23/2019    Patient Active Problem List   Diagnosis Date Noted  . Hypothyroidism 05/20/2020  . History of Roux-en-Y gastric bypass 01/27/2020  . Microcytic anemia 10/11/2019  . Pressure injury of skin 12/20/2017  . Pulmonary embolism ([place]) 12/17/2017  . ARF (acute renal failure) ([place]) 12/17/2017  . Incisional hernia 12/13/2017  . Chest pain 08/05/2017  . Abnormal nuclear stress test 08/05/2017  . Low bone mass 02/23/2017  . Chronic pain syndrome 07/18/2016  . Dyslipidemia 07/12/2016  . GERD (gastroesophageal reflux disease) 05/27/2016  . Slow transit constipation 06/06/2014  . Chronic combined systolic and diastolic heart failure ([place]) 12/04/2013  . Essential hypertension, benign 12/04/2013  . Spinal stenosis of lumbar region 12/04/2013  . Morbid obesity ([place]) 08/04/2013  . Acute combined systolic and diastolic congestive heart failure ([place]) 08/02/2013  . Type II or unspecified type  diabetes mellitus without mention of complication, uncontrolled 08/02/2013    Past Medical History:  Diagnosis Date  . Chronic back pain   . Hypothyroidism   . Pneumonia   . Pre-diabetes    takes metformin preventatively  . Ventral hernia     Past Surgical History:  Procedure Laterality Date  . ABDOMINAL HYSTERECTOMY  11/2011  . CHOLECYSTECTOMY  1988  . COLONOSCOPY WITH PROPOFOL N/A 05/27/2016   Procedure: COLONOSCOPY WITH PROPOFOL;   Surgeon: Wilford Corner, MD;  [facility];  Service: Endoscopy;  Laterality: N/A;  . DILATION AND CURETTAGE OF UTERUS     x2  . ESOPHAGOGASTRODUODENOSCOPY (EGD) WITH PROPOFOL N/A 05/27/2016   Procedure: ESOPHAGOGASTRODUODENOSCOPY (EGD) WITH PROPOFOL;  Surgeon: Wilford Corner, MD;  [facility];  Service: Endoscopy;  Laterality: N/A;  . GASTRIC BYPASS    . GASTRIC ROUX-EN-Y N/A 12/13/2017   Procedure: LAPAROSCOPIC ASSTED VENTRAL HERNIA REPAIR, Upper Endo;  Surgeon: Excell Seltzer, MD;  [facility];  Service: General;  Laterality: N/A;  With MESH  . LEFT HEART CATH AND CORONARY ANGIOGRAPHY N/A 08/05/2017   Procedure: LEFT HEART CATH AND CORONARY ANGIOGRAPHY;  Surgeon: Martinique, Peter M, MD;  Location: [facility];  Service: Cardiovascular;  Laterality: N/A;  . revision gastric bypass     and ventrel hernia repair  Dr. Excell Seltzer 12-13-17  . SPLENECTOMY, TOTAL  1988    Social History   Tobacco Use  . Smoking status: Never Smoker  . Smokeless tobacco: Never Used  Vaping Use  . Vaping Use: Never used  Substance Use Topics  . Alcohol use: No  . Drug use: No    History reviewed. No pertinent family history.  Allergies  Allergen Reactions  . Nsaids Swelling and Other (See Comments)    Severe swelling--water retention (including water on brain)  . Tolmetin Swelling and Other (See Comments)    Severe swelling-water retention (including water on brain)     Medication list has been reviewed and updated.  Current Outpatient Medications on File Prior to Visit  Medication Sig Dispense Refill  . albuterol (PROAIR HFA) 108 (90 Base) MCG/ACT inhaler Inhale 2 puffs into the lungs every 6 (six) hours as needed for wheezing or shortness of breath. 18 g 5  . amoxicillin (AMOXIL) 500 MG capsule Take 2 capsules (1,000 mg total) by mouth 2 (two) times daily. 40 capsule 0  . furosemide (LASIX) 20 MG tablet TAKE 2 TABLETS BY MOUTH ONCE DAILY. ADJUST DOSE AS  RECOMMENDED BY MD 180 tablet 1  . lovastatin (MEVACOR) 20 MG tablet TAKE 1 TABLET BY MOUTH AT BEDTIME 90 tablet 5  . metFORMIN (GLUCOPHAGE) 500 MG tablet TAKE 1 TABLET BY MOUTH TWICE DAILY WITH A MEAL 180 tablet 1  . NP THYROID 60 MG tablet TAKE 1 TABLET BY MOUTH ONCE DAILY BEFORE BREAKFAST 30 tablet 5  . traMADol (ULTRAM) 50 MG tablet Take 1 tablet (50 mg total) by mouth every 6 (six) hours as needed for moderate pain. 30 tablet 0  . apixaban (ELIQUIS) 5 MG TABS tablet Take 1 tablet (5 mg total) by mouth 2 (two) times daily. 60 tablet 5   No current facility-administered medications on file prior to visit.    Review of Systems:  As per HPI- otherwise negative.   Physical Examination: Vitals:   05/21/20 1047  BP: 132/80  Pulse: 71  Resp: 12  Temp: 98.3 F (36.8 C)  SpO2: 96%   Vitals:   05/21/20 1047  Weight: 261 lb  3.2 oz (118.5 kg)  Height: 5\' 4"  (1.626 m)   Body mass index is 44.83 kg/m. Ideal Body Weight: Weight in (lb) to have BMI = 25: 145.3  GEN: no acute distress.  Obese, no acute distress HEENT: Atraumatic, Normocephalic.  Ears and Nose: No external deformity. CV: RRR, No M/G/R. No JVD. No thrill. No extra heart sounds. PULM: CTA B, no wheezes, crackles, rhonchi. No retractions. No resp. distress. No accessory muscle use. ABD: S, NT, ND, +BS. No rebound. No HSM. EXTR: No c/c PSYCH: Normally interactive. Conversant.  Patient uses a walker to get around.  She has reduced strength in both lower extremities, venous stasis edema is present bilaterally   Assessment and Plan: Controlled type 2 diabetes mellitus without complication, without long-term current use of insulin ([place]) - Plan: Comprehensive metabolic panel, Hemoglobin A1c  Acquired hypothyroidism - Plan: TSH  History of Roux-en-Y gastric bypass - Plan: B12 and Folate Panel, Ferritin  Anticoagulant long-term use - Plan: CBC  Essential hypertension, benign  Dyslipidemia - Plan: Lipid panel  Spinal  stenosis of lumbosacral region   Patient here today with a few concerns.  We will check on her A1c, thyroid today.  History of gastric bypass, will check B12 and folate Kathleen Miller is concerned about affording her Eliquis.  I found and provided the phone number for [place] patient assistance program  I also encouraged her to follow-up with her neurologist and cardiologist at her earliest convenience Blood pressure under okay control Discussed possible disability benefits for patient.  I encouraged her to consult with directable lawyer, typically she can get a free a brief phone consultation to see if she has a case  Will plan further follow- up pending labs.  This visit occurred during the SARS-CoV-2 public health emergency.  Safety protocols were in place, including screening questions prior to the visit, additional usage of staff PPE, and extensive cleaning of exam room while observing appropriate contact time as indicated for disinfecting solutions.    Signed Lamar Blinks, MD  addnd 6/25- received her labs as below, letter to pt  Results for orders placed or performed in visit on 05/21/20  CBC  Result Value Ref Range   WBC 9.3 4.0 - 10.5 K/uL   RBC 4.50 3.87 - 5.11 Mil/uL   Platelets 415.0 (H) 150 - 400 K/uL   Hemoglobin 12.4 12.0 - 15.0 g/dL   HCT 37.0 36 - 46 %   MCV 82.2 78.0 - 100.0 fl   MCHC 33.4 30.0 - 36.0 g/dL   RDW 14.8 11.5 - 15.5 %  Comprehensive metabolic panel  Result Value Ref Range   Sodium 136 135 - 145 mEq/L   Potassium 3.8 3.5 - 5.1 mEq/L   Chloride 99 96 - 112 mEq/L   CO2 32 19 - 32 mEq/L   Glucose, Bld 89 70 - 99 mg/dL   BUN 7 6 - 23 mg/dL   Creatinine, Ser 0.74 0.40 - 1.20 mg/dL   Total Bilirubin 0.5 0.2 - 1.2 mg/dL   Alkaline Phosphatase 115 39 - 117 U/L   AST 20 0 - 37 U/L   ALT 18 0 - 35 U/L   Total Protein 7.1 6.0 - 8.3 g/dL   Albumin 4.0 3.5 - 5.2 g/dL   GFR 77.17 >60.00 mL/min   Calcium 9.5 8.4 - 10.5 mg/dL  Hemoglobin A1c   Result Value Ref Range   Hgb A1c MFr Bld 6.0 4.6 - 6.5 %  Lipid panel  Result Value Ref  Range   Cholesterol 133 0 - 200 mg/dL   Triglycerides 162.0 (H) 0 - 149 mg/dL   HDL 32.40 (L) >39.00 mg/dL   VLDL 32.4 0.0 - 40.0 mg/dL   LDL Cholesterol 68 0 - 99 mg/dL   Total CHOL/HDL Ratio 4    NonHDL 100.32   TSH  Result Value Ref Range   TSH 2.21 0.35 - 4.50 uIU/mL  B12 and Folate Panel  Result Value Ref Range   Vitamin B-12 158 (L) 211 - 911 pg/mL   Folate 6.6 >5.9 ng/mL  Ferritin  Result Value Ref Range   Ferritin 58.1 10.0 - 291.0 ng/mL

## 2020-05-21 ENCOUNTER — Other Ambulatory Visit: Payer: Self-pay

## 2020-05-21 ENCOUNTER — Encounter: Payer: Self-pay | Admitting: *Deleted

## 2020-05-21 ENCOUNTER — Ambulatory Visit (INDEPENDENT_AMBULATORY_CARE_PROVIDER_SITE_OTHER): Payer: Medicare HMO | Admitting: *Deleted

## 2020-05-21 ENCOUNTER — Encounter: Payer: Self-pay | Admitting: Family Medicine

## 2020-05-21 ENCOUNTER — Ambulatory Visit (INDEPENDENT_AMBULATORY_CARE_PROVIDER_SITE_OTHER): Payer: Medicare HMO | Admitting: Family Medicine

## 2020-05-21 VITALS — BP 132/80 | HR 71 | Temp 98.3°F | Ht 64.0 in | Wt 261.0 lb

## 2020-05-21 VITALS — BP 132/80 | HR 71 | Temp 98.3°F | Resp 12 | Ht 64.0 in | Wt 261.2 lb

## 2020-05-21 DIAGNOSIS — E785 Hyperlipidemia, unspecified: Secondary | ICD-10-CM

## 2020-05-21 DIAGNOSIS — Z9884 Bariatric surgery status: Secondary | ICD-10-CM | POA: Diagnosis not present

## 2020-05-21 DIAGNOSIS — E119 Type 2 diabetes mellitus without complications: Secondary | ICD-10-CM

## 2020-05-21 DIAGNOSIS — M4807 Spinal stenosis, lumbosacral region: Secondary | ICD-10-CM | POA: Diagnosis not present

## 2020-05-21 DIAGNOSIS — Z7901 Long term (current) use of anticoagulants: Secondary | ICD-10-CM

## 2020-05-21 DIAGNOSIS — Z0000 Encounter for general adult medical examination without abnormal findings: Secondary | ICD-10-CM

## 2020-05-21 DIAGNOSIS — I10 Essential (primary) hypertension: Secondary | ICD-10-CM

## 2020-05-21 DIAGNOSIS — E039 Hypothyroidism, unspecified: Secondary | ICD-10-CM | POA: Diagnosis not present

## 2020-05-21 LAB — COMPREHENSIVE METABOLIC PANEL
ALT: 18 U/L (ref 0–35)
AST: 20 U/L (ref 0–37)
Albumin: 4 g/dL (ref 3.5–5.2)
Alkaline Phosphatase: 115 U/L (ref 39–117)
BUN: 7 mg/dL (ref 6–23)
CO2: 32 mEq/L (ref 19–32)
Calcium: 9.5 mg/dL (ref 8.4–10.5)
Chloride: 99 mEq/L (ref 96–112)
Creatinine, Ser: 0.74 mg/dL (ref 0.40–1.20)
GFR: 77.17 mL/min (ref >60.00)
Glucose, Bld: 89 mg/dL (ref 70–99)
Potassium: 3.8 mEq/L (ref 3.5–5.1)
Sodium: 136 mEq/L (ref 135–145)
Total Bilirubin: 0.5 mg/dL (ref 0.2–1.2)
Total Protein: 7.1 g/dL (ref 6.0–8.3)

## 2020-05-21 LAB — LIPID PANEL
Cholesterol: 133 mg/dL (ref 0–200)
HDL: 32.4 mg/dL — ABNORMAL LOW (ref >39.00)
LDL Cholesterol: 68 mg/dL (ref 0–99)
NonHDL: 100.32
Total CHOL/HDL Ratio: 4
Triglycerides: 162 mg/dL — ABNORMAL HIGH (ref 0.0–149.0)
VLDL: 32.4 mg/dL (ref 0.0–40.0)

## 2020-05-21 LAB — TSH: TSH: 2.21 u[IU]/mL (ref 0.35–4.50)

## 2020-05-21 LAB — B12 AND FOLATE PANEL
Folate: 6.6 ng/mL (ref >5.9)
Vitamin B-12: 158 pg/mL — ABNORMAL LOW (ref 211–911)

## 2020-05-21 LAB — HEMOGLOBIN A1C: Hgb A1c MFr Bld: 6 % (ref 4.6–6.5)

## 2020-05-21 LAB — CBC
HCT: 37 % (ref 36.0–46.0)
Hemoglobin: 12.4 g/dL (ref 12.0–15.0)
MCHC: 33.4 g/dL (ref 30.0–36.0)
MCV: 82.2 fl (ref 78.0–100.0)
Platelets: 415 10*3/uL — ABNORMAL HIGH (ref 150.0–400.0)
RBC: 4.5 Mil/uL (ref 3.87–5.11)
RDW: 14.8 % (ref 11.5–15.5)
WBC: 9.3 10*3/uL (ref 4.0–10.5)

## 2020-05-21 LAB — FERRITIN: Ferritin: 58.1 ng/mL (ref 10.0–291.0)

## 2020-05-21 NOTE — Patient Instructions (Addendum)
Please give Dr Ermalene Postin a call to go over your spinal stenosis symptoms Ermalene Postin, Diona Foley, MD  [address]  [place], [place] [address]  Phone: [phone]  Fax: [phone]   Keep your phone on you in case of falls!   I would recommend that you get the new shingles series- Shingrix- and also a tetanus booster from your pharmacy at your convenience   I am not sure if you may be able to get disability benefits at this point- I would recommend that you consult with a reputable lawyer, typically you can do a brief consultation for free to see if they have anything to offer you   I would also recommend that you follow-up with your cardiologist at your earliest convenience- you see DR Johnsie Cancel with [facility] cardiology  [address] Open now  [phone]  Please call the following number for pt assistance for your Eliquis We can assist you with any questions you may have about BMSPAF, free of charge. Our number is [phone].

## 2020-05-21 NOTE — Patient Instructions (Signed)
Kathleen Miller , Thank you for taking time to come for your Medicare Wellness Visit. I appreciate your ongoing commitment to your health goals. Please review the following plan we discussed and let me know if I can assist you in the future.   Screening recommendations/referrals: Colorectal cancer screening: Completed 05/25/16. Repeat every (no recall on file) years Mammogram status: Completed 10/30/19. Repeat every year Bone Density Scan: 02/22/17 Recommended yearly ophthalmology/optometry visit for glaucoma screening and checkup Recommended yearly dental visit for hygiene and checkup  Vaccinations: TDAP status: Due, Education has been provided regarding the importance of this vaccine. Advised may receive this vaccine at local pharmacy or Health Dept. Aware to provide a copy of the vaccination record if obtained from local pharmacy or Health Dept. Verbalized acceptance and understanding. Flu Vaccine status: Up to date Pneumococcal vaccine status: Up to date Covid-19 vaccine status: Completed vaccines  Advanced directives: Bring a copy of your living will and/or healthcare power of attorney to your next office visit.    Next appointment: Follow up in one year for your annual wellness visit    Preventive Care 72 Years and Older, Female Preventive care refers to lifestyle choices and visits with your health care provider that can promote health and wellness. What does preventive care include?  A yearly physical exam. This is also called an annual well check.  Dental exams once or twice a year.  Routine eye exams. Ask your health care provider how often you should have your eyes checked.  Personal lifestyle choices, including:  Daily care of your teeth and gums.  Regular physical activity.  Eating a healthy diet.  Avoiding tobacco and drug use.  Limiting alcohol use.  Practicing safe sex.  Taking low-dose aspirin every day.  Taking vitamin and mineral supplements as recommended  by your health care provider. What happens during an annual well check? The services and screenings done by your health care provider during your annual well check will depend on your age, overall health, lifestyle risk factors, and family history of disease. Counseling  Your health care provider may ask you questions about your:  Alcohol use.  Tobacco use.  Drug use.  Emotional well-being.  Home and relationship well-being.  Sexual activity.  Eating habits.  History of falls.  Memory and ability to understand (cognition).  Work and work Statistician.  Reproductive health. Screening  You may have the following tests or measurements:  Height, weight, and BMI.  Blood pressure.  Lipid and cholesterol levels. These may be checked every 5 years, or more frequently if you are over 70 years old.  Skin check.  Lung cancer screening. You may have this screening every year starting at age 50 if you have a 30-pack-year history of smoking and currently smoke or have quit within the past 15 years.  Fecal occult blood test (FOBT) of the stool. You may have this test every year starting at age 72.  Flexible sigmoidoscopy or colonoscopy. You may have a sigmoidoscopy every 5 years or a colonoscopy every 10 years starting at age 72.  Hepatitis C blood test.  Hepatitis B blood test.  Sexually transmitted disease (STD) testing.  Diabetes screening. This is done by checking your blood sugar (glucose) after you have not eaten for a while (fasting). You may have this done every 1-3 years.  Bone density scan. This is done to screen for osteoporosis. You may have this done starting at age 72.  Mammogram. This may be done every 1-2 years. Talk  to your health care provider about how often you should have regular mammograms. Talk with your health care provider about your test results, treatment options, and if necessary, the need for more tests. Vaccines  Your health care provider may  recommend certain vaccines, such as:  Influenza vaccine. This is recommended every year.  Tetanus, diphtheria, and acellular pertussis (Tdap, Td) vaccine. You may need a Td booster every 10 years.  Zoster vaccine. You may need this after age 72.  Pneumococcal 13-valent conjugate (PCV13) vaccine. One dose is recommended after age 72.  Pneumococcal polysaccharide (PPSV23) vaccine. One dose is recommended after age 72. Talk to your health care provider about which screenings and vaccines you need and how often you need them. This information is not intended to replace advice given to you by your health care provider. Make sure you discuss any questions you have with your health care provider. Document Released: 12/12/2015 Document Revised: 08/04/2016 Document Reviewed: 09/16/2015 Elsevier Interactive Patient Education  2017 [employer] Prevention in the Home Falls can cause injuries. They can happen to people of all ages. There are many things you can do to make your home safe and to help prevent falls. What can I do on the outside of my home?  Regularly fix the edges of walkways and driveways and fix any cracks.  Remove anything that might make you trip as you walk through a door, such as a raised step or threshold.  Trim any bushes or trees on the path to your home.  Use bright outdoor lighting.  Clear any walking paths of anything that might make someone trip, such as rocks or tools.  Regularly check to see if handrails are loose or broken. Make sure that both sides of any steps have handrails.  Any raised decks and porches should have guardrails on the edges.  Have any leaves, snow, or ice cleared regularly.  Use sand or salt on walking paths during winter.  Clean up any spills in your garage right away. This includes oil or grease spills. What can I do in the bathroom?  Use night lights.  Install grab bars by the toilet and in the tub and shower. Do not use towel  bars as grab bars.  Use non-skid mats or decals in the tub or shower.  If you need to sit down in the shower, use a plastic, non-slip stool.  Keep the floor dry. Clean up any water that spills on the floor as soon as it happens.  Remove soap buildup in the tub or shower regularly.  Attach bath mats securely with double-sided non-slip rug tape.  Do not have throw rugs and other things on the floor that can make you trip. What can I do in the bedroom?  Use night lights.  Make sure that you have a light by your bed that is easy to reach.  Do not use any sheets or blankets that are too big for your bed. They should not hang down onto the floor.  Have a firm chair that has side arms. You can use this for support while you get dressed.  Do not have throw rugs and other things on the floor that can make you trip. What can I do in the kitchen?  Clean up any spills right away.  Avoid walking on wet floors.  Keep items that you use a lot in easy-to-reach places.  If you need to reach something above you, use a strong step stool that  has a grab bar.  Keep electrical cords out of the way.  Do not use floor polish or wax that makes floors slippery. If you must use wax, use non-skid floor wax.  Do not have throw rugs and other things on the floor that can make you trip. What can I do with my stairs?  Do not leave any items on the stairs.  Make sure that there are handrails on both sides of the stairs and use them. Fix handrails that are broken or loose. Make sure that handrails are as long as the stairways.  Check any carpeting to make sure that it is firmly attached to the stairs. Fix any carpet that is loose or worn.  Avoid having throw rugs at the top or bottom of the stairs. If you do have throw rugs, attach them to the floor with carpet tape.  Make sure that you have a light switch at the top of the stairs and the bottom of the stairs. If you do not have them, ask someone to  add them for you. What else can I do to help prevent falls?  Wear shoes that:  Do not have high heels.  Have rubber bottoms.  Are comfortable and fit you well.  Are closed at the toe. Do not wear sandals.  If you use a stepladder:  Make sure that it is fully opened. Do not climb a closed stepladder.  Make sure that both sides of the stepladder are locked into place.  Ask someone to hold it for you, if possible.  Clearly mark and make sure that you can see:  Any grab bars or handrails.  First and last steps.  Where the edge of each step is.  Use tools that help you move around (mobility aids) if they are needed. These include:  Canes.  Walkers.  Scooters.  Crutches.  Turn on the lights when you go into a dark area. Replace any light bulbs as soon as they burn out.  Set up your furniture so you have a clear path. Avoid moving your furniture around.  If any of your floors are uneven, fix them.  If there are any pets around you, be aware of where they are.  Review your medicines with your doctor. Some medicines can make you feel dizzy. This can increase your chance of falling. Ask your doctor what other things that you can do to help prevent falls. This information is not intended to replace advice given to you by your health care provider. Make sure you discuss any questions you have with your health care provider. Document Released: 09/11/2009 Document Revised: 04/22/2016 Document Reviewed: 12/20/2014 Elsevier Interactive Patient Education  2017 [employer].

## 2020-07-21 ENCOUNTER — Other Ambulatory Visit: Payer: Self-pay | Admitting: Family Medicine

## 2020-07-21 DIAGNOSIS — M4807 Spinal stenosis, lumbosacral region: Secondary | ICD-10-CM

## 2020-07-21 DIAGNOSIS — R600 Localized edema: Secondary | ICD-10-CM

## 2020-07-21 DIAGNOSIS — E119 Type 2 diabetes mellitus without complications: Secondary | ICD-10-CM

## 2020-07-21 DIAGNOSIS — I5042 Chronic combined systolic (congestive) and diastolic (congestive) heart failure: Secondary | ICD-10-CM

## 2020-07-22 NOTE — Progress Notes (Addendum)
[facility] [place], [place], [place] [address] [phone]  Date:  07/24/2020   Name:  Kathleen Miller   DOB:  01-11-1946   MRN:  [id number]  PCP:  Darreld Mclean, MD    Chief Complaint: Follow-up   History of Present Illness:  Kathleen Miller is a 74 y.o. very pleasant female patient who presents with the following:  Patient here today for periodic follow-up visit Last seen by myself in [place] time she was wondering about getting disability benefits, she is still working but this is becoming more difficult for her  She has a lot of issues with back pain, swelling in her feet and legs  [name] has history of heart failure, hypertension, spinal stenosis, well controlled diabetes, dyslipidemia, history of gastric bypass in the 1980s, microcytic anemia, Eliquis lifelong for history of blood clotting, hypothyroidism Mildly low B12 on most recent lab panel  Tetanus booster Eye exam- she plans to have this done soon Urine microalbumin due Foot exam due- do today covid series done - booster due in December, reminded patient Lab Results  Component Value Date   HGBA1C 6.0 05/21/2020   She notes that her feet are swelling- they get worse as the day goes on.  Look ok in the am and get worse as the day goes on She is not able to use her diuretic when she is at work due to the bathroom being far away  [facility] Readings from Last 3 Encounters:  07/24/20 255 lb (115.7 kg)  05/21/20 261 lb (118.4 kg)  05/21/20 261 lb 3.2 oz (118.5 kg)   She does note a tender spot behind her LEFT knee which has been present for over a year She feels the need to keep the leg elevated when seated- otherwise the edge of a chair or seat will hurt her leg   She has worked hard on losing weight, she is down about 6 lbs  She is interested in trying Saxenda- we discussed this today She has no apparent connotation, feels comfortable with  the idea of giving self injections  Lab Results  Component Value Date   TSH 2.21 05/21/2020   She has been off her thyroid med for a few days - [facility] Will try sending this to [facility] for her  Also refill other medications today   Patient Active Problem List   Diagnosis Date Noted  . Hypothyroidism 05/20/2020  . History of Roux-en-Y gastric bypass 01/27/2020  . Microcytic anemia 10/11/2019  . Pressure injury of skin 12/20/2017  . Pulmonary embolism ([place]) 12/17/2017  . ARF (acute renal failure) ([place]) 12/17/2017  . Incisional hernia 12/13/2017  . Chest pain 08/05/2017  . Abnormal nuclear stress test 08/05/2017  . Low bone mass 02/23/2017  . Chronic pain syndrome 07/18/2016  . Dyslipidemia 07/12/2016  . GERD (gastroesophageal reflux disease) 05/27/2016  . Slow transit constipation 06/06/2014  . Chronic combined systolic and diastolic heart failure ([place]) 12/04/2013  . Essential hypertension, benign 12/04/2013  . Spinal stenosis of lumbar region 12/04/2013  . Morbid obesity ([place]) 08/04/2013  . Acute combined systolic and diastolic congestive heart failure ([place]) 08/02/2013  . Type II or unspecified type diabetes mellitus without mention of complication, uncontrolled 08/02/2013    Past Medical History:  Diagnosis Date  . Chronic back pain   . Hypothyroidism   . Pneumonia   . Pre-diabetes  takes metformin preventatively  . Ventral hernia     Past Surgical History:  Procedure Laterality Date  . ABDOMINAL HYSTERECTOMY  11/2011  . CHOLECYSTECTOMY  1988  . COLONOSCOPY WITH PROPOFOL N/A 05/27/2016   Procedure: COLONOSCOPY WITH PROPOFOL;  Surgeon: Wilford Corner, MD;  Location: [facility];  Service: Endoscopy;  Laterality: N/A;  . DILATION AND CURETTAGE OF UTERUS     x2  . ESOPHAGOGASTRODUODENOSCOPY (EGD) WITH PROPOFOL N/A 05/27/2016   Procedure: ESOPHAGOGASTRODUODENOSCOPY (EGD) WITH PROPOFOL;  Surgeon: Wilford Corner, MD;   Location: [facility];  Service: Endoscopy;  Laterality: N/A;  . GASTRIC BYPASS    . GASTRIC ROUX-EN-Y N/A 12/13/2017   Procedure: LAPAROSCOPIC ASSTED VENTRAL HERNIA REPAIR, Upper Endo;  Surgeon: Excell Seltzer, MD;  Location: [facility];  Service: General;  Laterality: N/A;  With MESH  . LEFT HEART CATH AND CORONARY ANGIOGRAPHY N/A 08/05/2017   Procedure: LEFT HEART CATH AND CORONARY ANGIOGRAPHY;  Surgeon: Martinique, Peter M, MD;  Location: [facility];  Service: Cardiovascular;  Laterality: N/A;  . revision gastric bypass     and ventrel hernia repair  Dr. Excell Seltzer 12-13-17  . SPLENECTOMY, TOTAL  1988    Social History   Tobacco Use  . Smoking status: Never Smoker  . Smokeless tobacco: Never Used  Vaping Use  . Vaping Use: Never used  Substance Use Topics  . Alcohol use: No  . Drug use: No    History reviewed. No pertinent family history.  Allergies  Allergen Reactions  . Nsaids Swelling and Other (See Comments)    Severe swelling--water retention (including water on brain)  . Tolmetin Swelling and Other (See Comments)    Severe swelling-water retention (including water on brain)     Medication list has been reviewed and updated.  Current Outpatient Medications on File Prior to Visit  Medication Sig Dispense Refill  . albuterol (PROAIR HFA) 108 (90 Base) MCG/ACT inhaler Inhale 2 puffs into the lungs every 6 (six) hours as needed for wheezing or shortness of breath. 18 g 5  . amoxicillin (AMOXIL) 500 MG capsule Take 2 capsules (1,000 mg total) by mouth 2 (two) times daily. 40 capsule 0  . cyclobenzaprine (FLEXERIL) 10 MG tablet     . furosemide (LASIX) 20 MG tablet Take 2 tablets (40 mg total) by mouth daily. 60 tablet 0  . lovastatin (MEVACOR) 20 MG tablet TAKE 1 TABLET BY MOUTH AT BEDTIME 90 tablet 5  . metFORMIN (GLUCOPHAGE) 500 MG tablet Take 1 tablet (500 mg total) by mouth 2 (two) times daily with a meal. 60 tablet 0  . NP THYROID 60 MG tablet TAKE 1 TABLET BY  MOUTH ONCE DAILY BEFORE BREAKFAST 30 tablet 5  . traMADol (ULTRAM) 50 MG tablet Take 1 tablet (50 mg total) by mouth every 6 (six) hours as needed for moderate pain. 30 tablet 0  . apixaban (ELIQUIS) 5 MG TABS tablet Take 1 tablet (5 mg total) by mouth 2 (two) times daily. 60 tablet 5   No current facility-administered medications on file prior to visit.    Review of Systems:  As per HPI- otherwise negative.   Physical Examination: Vitals:   07/24/20 0909  BP: 124/76  Pulse: 67  Resp: 18  SpO2: 98%   Vitals:   07/24/20 0909  Weight: 255 lb (115.7 kg)  Height: 5\' 4"  (1.626 m)   Body mass index is 43.77 kg/m. Ideal Body Weight: Weight in (lb) to have BMI = 25: 145.3  GEN: no acute  distress.  Obese, otherwise looks well HEENT: Atraumatic, Normocephalic.  Ears and Nose: No external deformity. CV: RRR, No M/G/R. No JVD. No thrill. No extra heart sounds. PULM: CTA B, no wheezes, crackles, rhonchi. No retractions. No resp. distress. No accessory muscle use. ABD: S, NT, ND, +BS. No rebound. No HSM. EXTR: No c/c.  1+ edema of both feet PSYCH: Normally interactive. Conversant.  Uses cane or walker to ambulate   Assessment and Plan: Left leg pain - Plan: US Venous Img Lower Unilateral Left  Acquired hypothyroidism - Plan: thyroid (NP THYROID) 60 MG tablet  Chronic combined systolic and diastolic heart failure (HCC) - Plan: furosemide (LASIX) 20 MG tablet  Lower extremity edema - Plan: furosemide (LASIX) 20 MG tablet  Spinal stenosis of lumbosacral region - Plan: cyclobenzaprine (FLEXERIL) 10 MG tablet, traMADol (ULTRAM) 50 MG tablet  Morbid obesity (HCC) - Plan: Liraglutide -Weight Management (SAXENDA) 18 MG/3ML SOPN, Insulin Pen Needle (PEN NEEDLES) 32G X 5 MM MISC  Controlled type 2 diabetes mellitus without complication, without long-term current use of insulin (HCC) - Plan: Microalbumin / creatinine urine ratio  Patient here today for follow-up visit She has noted a  tender spot behind her left knee for a little over a year.  I suspect this is benign, but will obtain an ultrasound to ensure no chance of clot Refill of Flexeril and tramadol, she uses both of these occasionally Urine microalbumin pending Recent A1c showed good control of her diabetes Prescribe Saxenda for weight loss, she was given a sample pen and teaching session today Will plan further follow- up pending labs.   This visit occurred during the SARS-CoV-2 public health emergency.  Safety protocols were in place, including screening questions prior to the visit, additional usage of staff PPE, and extensive cleaning of exam room while observing appropriate contact time as indicated for disinfecting solutions.    Signed Lamar Blinks, MD Received her labs as below, letter to pt  Results for orders placed or performed in visit on 07/24/20  Microalbumin / creatinine urine ratio  Result Value Ref Range   Creatinine, Urine 153 20 - 275 mg/dL   Microalb, Ur 0.4 mg/dL   Microalb Creat Ratio 3 <30 mcg/mg creat

## 2020-07-22 NOTE — Patient Instructions (Addendum)
It was great to see you again today!  I will be in touch with your ultrasound report asap Please be sure to get your flu shot this fall  We can try saxenda for weight loss- this is an injection, please look it up online to find out more and make sure you think you can do the shots at home  Please see me in 6 months

## 2020-07-24 ENCOUNTER — Telehealth: Payer: Self-pay | Admitting: Family Medicine

## 2020-07-24 ENCOUNTER — Encounter: Payer: Self-pay | Admitting: Family Medicine

## 2020-07-24 ENCOUNTER — Ambulatory Visit (INDEPENDENT_AMBULATORY_CARE_PROVIDER_SITE_OTHER): Payer: Medicare HMO | Admitting: Family Medicine

## 2020-07-24 ENCOUNTER — Other Ambulatory Visit: Payer: Self-pay

## 2020-07-24 VITALS — BP 124/76 | HR 67 | Resp 18 | Ht 64.0 in | Wt 255.0 lb

## 2020-07-24 DIAGNOSIS — M79605 Pain in left leg: Secondary | ICD-10-CM | POA: Diagnosis not present

## 2020-07-24 DIAGNOSIS — E039 Hypothyroidism, unspecified: Secondary | ICD-10-CM | POA: Diagnosis not present

## 2020-07-24 DIAGNOSIS — R600 Localized edema: Secondary | ICD-10-CM

## 2020-07-24 DIAGNOSIS — M4807 Spinal stenosis, lumbosacral region: Secondary | ICD-10-CM | POA: Diagnosis not present

## 2020-07-24 DIAGNOSIS — E119 Type 2 diabetes mellitus without complications: Secondary | ICD-10-CM

## 2020-07-24 DIAGNOSIS — I5042 Chronic combined systolic (congestive) and diastolic (congestive) heart failure: Secondary | ICD-10-CM

## 2020-07-24 MED ORDER — PEN NEEDLES 32G X 5 MM MISC: 3 refills | Status: DC

## 2020-07-24 MED ORDER — THYROID 60 MG PO TABS: 60.0000 mg | ORAL_TABLET | Freq: Every day | ORAL | 5 refills | Status: DC

## 2020-07-24 MED ORDER — TRAMADOL HCL 50 MG PO TABS: 50.0000 mg | ORAL_TABLET | Freq: Four times a day (QID) | ORAL | 0 refills | Status: DC | PRN

## 2020-07-24 MED ORDER — CYCLOBENZAPRINE HCL 10 MG PO TABS: 10.0000 mg | ORAL_TABLET | Freq: Two times a day (BID) | ORAL | 1 refills | Status: DC | PRN

## 2020-07-24 MED ORDER — SAXENDA 18 MG/3ML ~~LOC~~ SOPN: PEN_INJECTOR | SUBCUTANEOUS | 1 refills | Status: DC

## 2020-07-24 MED ORDER — FUROSEMIDE 20 MG PO TABS: 40.0000 mg | ORAL_TABLET | Freq: Every day | ORAL | 3 refills | Status: AC

## 2020-07-24 NOTE — Telephone Encounter (Signed)
New Message:   Pt is calling and states she needs to file for disabilty and would like some recommendations on how to do this and wants some advice on who to use. Please advise.

## 2020-07-25 LAB — MICROALBUMIN / CREATININE URINE RATIO
Creatinine, Urine: 153 mg/dL (ref 20–275)
Microalb Creat Ratio: 3 mcg/mg creat (ref <30)
Microalb, Ur: 0.4 mg/dL

## 2020-07-25 NOTE — Telephone Encounter (Signed)
Spoke with patient she is wondering if their are any attorneys personally to help with applying for disability. She states she needs someone good and just does not want to waste time. I informed her im not sure if you would know any to recommend to her. She said you gave her a name in the past but she can not remember.

## 2020-07-25 NOTE — Telephone Encounter (Signed)
Called her back, gave name of reputable firm but advised I am not a legal expert

## 2020-07-28 ENCOUNTER — Telehealth: Payer: Self-pay | Admitting: Family Medicine

## 2020-07-28 ENCOUNTER — Other Ambulatory Visit: Payer: Self-pay

## 2020-07-28 ENCOUNTER — Ambulatory Visit (HOSPITAL_BASED_OUTPATIENT_CLINIC_OR_DEPARTMENT_OTHER)
Admission: RE | Admit: 2020-07-28 | Discharge: 2020-07-28 | Disposition: A | Discharge status: Home / Self Care | Payer: Medicare HMO | Source: Ambulatory Visit | Attending: Family Medicine | Admitting: Family Medicine

## 2020-07-28 DIAGNOSIS — M79605 Pain in left leg: Secondary | ICD-10-CM

## 2020-07-28 DIAGNOSIS — M79662 Pain in left lower leg: Secondary | ICD-10-CM | POA: Diagnosis not present

## 2020-07-28 DIAGNOSIS — I8392 Asymptomatic varicose veins of left lower extremity: Secondary | ICD-10-CM | POA: Diagnosis not present

## 2020-07-28 NOTE — Telephone Encounter (Signed)
Patient came in today and said her medicine was $1000. 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  She needs to be prescribed something else    Medicine:   Kathleen Miller

## 2020-07-29 ENCOUNTER — Telehealth: Payer: Self-pay | Admitting: Family Medicine

## 2020-07-29 ENCOUNTER — Telehealth: Payer: Self-pay

## 2020-07-29 DIAGNOSIS — E039 Hypothyroidism, unspecified: Secondary | ICD-10-CM

## 2020-07-29 MED ORDER — THYROID 60 MG PO TABS: 60.0000 mg | ORAL_TABLET | Freq: Every day | ORAL | 5 refills | Status: AC

## 2020-07-29 NOTE — Telephone Encounter (Signed)
Left message on machine for patient, unfortunately Kathleen Miller is sometimes not covered.  There is not really an alternative drug which is comparable

## 2020-07-29 NOTE — Telephone Encounter (Signed)
Tried setting patient up for saving card however she has medicare so these are not allowed. Unable to apply savings. Patients cost is $1400. Please advise on alternative medication.

## 2020-07-29 NOTE — Telephone Encounter (Signed)
Attempted PA. Response from insurance was to give patient savings card. I have activated savings card. Will call pharmacy when they open to see if this will help with Copay prior to changing medication.

## 2020-07-29 NOTE — Telephone Encounter (Signed)
NP thyroid not covered by [employer]. Okay to change to levothyroxine? Or is there a medical reason she needs to be on the NP thyroid?

## 2020-07-29 NOTE — Telephone Encounter (Signed)
Call patient and left message on machine.  Recent ultrasound is negative, no sign of DVT  IMPRESSION: 1. No evidence of left lower extremity deep venous thrombosis. 2. Area of discomfort corresponds to patent superficial varicosities of the distal left lateral thigh. No evidence of thrombosed Varicosities.  There is a problem of coverage with her NP thyroid.  Would it be okay for [place] to change her to levothyroxine?  I will try her again later

## 2020-07-29 NOTE — Telephone Encounter (Signed)
Called her and was able to speak with her- she has pickup up the NP thyroid, would like [facility] at [facility] to save money if possible Will give up on saxenda idea due to cost No DVT on recent US   IMPRESSION: 1. No evidence of left lower extremity deep venous thrombosis. 2. Area of discomfort corresponds to patent superficial varicosities of the distal left lateral thigh. No evidence of thrombosed varicosities.

## 2020-07-31 NOTE — Telephone Encounter (Signed)
[place] called in regards to the NP Thyroid , stated medication isnt covered... I made them aware that her PCP was working with her on trying to get her another medication, and she stated she will no longer call and try to follow up on the mediation. She also stated she gave the pt , prices for different alternatives .

## 2020-09-19 ENCOUNTER — Other Ambulatory Visit: Payer: Self-pay | Admitting: Family Medicine

## 2020-09-19 DIAGNOSIS — M4807 Spinal stenosis, lumbosacral region: Secondary | ICD-10-CM

## 2020-09-19 DIAGNOSIS — E119 Type 2 diabetes mellitus without complications: Secondary | ICD-10-CM

## 2020-09-19 NOTE — Telephone Encounter (Signed)
Requesting: tramadol 50mg  Contract: None UDS: None Last Visit: 07/24/2020 Next Visit: None scheduled Last Refill: 07/24/2020 #30 and 0RF Pt sig: 1 tab q6h prn  Please Advise

## 2020-10-28 ENCOUNTER — Telehealth: Payer: Self-pay

## 2020-10-28 ENCOUNTER — Other Ambulatory Visit: Payer: Self-pay

## 2020-10-28 DIAGNOSIS — Z7901 Long term (current) use of anticoagulants: Secondary | ICD-10-CM

## 2020-10-28 MED ORDER — APIXABAN 5 MG PO TABS: 5.0000 mg | ORAL_TABLET | Freq: Two times a day (BID) | ORAL | 3 refills | Status: DC

## 2020-10-28 NOTE — Telephone Encounter (Signed)
Called pt- from last Hematology note Dr Maylon Peppers it looks like the plan is for permanent anticoagulation with eliquis 5 BID  In the absence of major contraindications, the goal of anticoagulation therapy is lifelong.  -Due to the extensiveness of her previous PTE, I would recommend continuing Eliquis 5mg  BID in the absence of significant bleeding (instead of reducing the dose to 2.5mg  BID).  LMOM for pt- I have [facility] the [facility]; let me know if not correct  Meds ordered this encounter  Medications   apixaban (ELIQUIS) 5 MG TABS tablet    Sig: Take 1 tablet (5 mg total) by mouth 2 (two) times daily.    Dispense:  180 tablet    Refill:  3

## 2020-10-28 NOTE — Telephone Encounter (Signed)
Received refill request from [facility] for Pt for Eliquis 5mg . Is Pt on this long term?

## 2020-11-13 DIAGNOSIS — H5213 Myopia, bilateral: Secondary | ICD-10-CM | POA: Diagnosis not present

## 2020-11-13 DIAGNOSIS — Z0100 Encounter for examination of eyes and vision without abnormal findings: Secondary | ICD-10-CM | POA: Diagnosis not present

## 2021-01-02 ENCOUNTER — Telehealth: Payer: Self-pay | Admitting: Family Medicine

## 2021-01-02 NOTE — Telephone Encounter (Addendum)
Patient state she has a bad cough she would like  either three of the medicine Amoxicillin,Penicillin or Clarinex preferably Clarinex to help stop the cough

## 2021-01-02 NOTE — Telephone Encounter (Signed)
Needs virtual visit 

## 2021-05-16 ENCOUNTER — Other Ambulatory Visit: Payer: Self-pay | Admitting: Family Medicine

## 2021-05-16 DIAGNOSIS — E785 Hyperlipidemia, unspecified: Secondary | ICD-10-CM

## 2021-06-27 ENCOUNTER — Other Ambulatory Visit: Payer: Self-pay | Admitting: Family Medicine

## 2021-06-27 DIAGNOSIS — M4807 Spinal stenosis, lumbosacral region: Secondary | ICD-10-CM

## 2021-07-09 DIAGNOSIS — E039 Hypothyroidism, unspecified: Secondary | ICD-10-CM | POA: Diagnosis not present

## 2021-07-09 DIAGNOSIS — Z1231 Encounter for screening mammogram for malignant neoplasm of breast: Secondary | ICD-10-CM | POA: Diagnosis not present

## 2021-07-09 DIAGNOSIS — E538 Deficiency of other specified B group vitamins: Secondary | ICD-10-CM | POA: Diagnosis not present

## 2021-07-09 DIAGNOSIS — I152 Hypertension secondary to endocrine disorders: Secondary | ICD-10-CM | POA: Diagnosis not present

## 2021-07-09 DIAGNOSIS — E119 Type 2 diabetes mellitus without complications: Secondary | ICD-10-CM | POA: Diagnosis not present

## 2021-07-09 DIAGNOSIS — N3281 Overactive bladder: Secondary | ICD-10-CM | POA: Diagnosis not present

## 2021-07-09 DIAGNOSIS — E1159 Type 2 diabetes mellitus with other circulatory complications: Secondary | ICD-10-CM | POA: Diagnosis not present

## 2021-07-09 DIAGNOSIS — E785 Hyperlipidemia, unspecified: Secondary | ICD-10-CM | POA: Diagnosis not present

## 2021-07-09 DIAGNOSIS — E1169 Type 2 diabetes mellitus with other specified complication: Secondary | ICD-10-CM | POA: Diagnosis not present

## 2021-07-09 DIAGNOSIS — Z9884 Bariatric surgery status: Secondary | ICD-10-CM | POA: Diagnosis not present

## 2021-07-16 DIAGNOSIS — E538 Deficiency of other specified B group vitamins: Secondary | ICD-10-CM | POA: Diagnosis not present

## 2021-07-23 DIAGNOSIS — E538 Deficiency of other specified B group vitamins: Secondary | ICD-10-CM | POA: Diagnosis not present

## 2021-07-30 DIAGNOSIS — E538 Deficiency of other specified B group vitamins: Secondary | ICD-10-CM | POA: Diagnosis not present

## 2021-08-06 DIAGNOSIS — E538 Deficiency of other specified B group vitamins: Secondary | ICD-10-CM | POA: Diagnosis not present

## 2021-08-11 DIAGNOSIS — Z1212 Encounter for screening for malignant neoplasm of rectum: Secondary | ICD-10-CM | POA: Diagnosis not present

## 2021-08-11 DIAGNOSIS — M48062 Spinal stenosis, lumbar region with neurogenic claudication: Secondary | ICD-10-CM | POA: Diagnosis not present

## 2021-08-11 DIAGNOSIS — E2839 Other primary ovarian failure: Secondary | ICD-10-CM | POA: Diagnosis not present

## 2021-08-11 DIAGNOSIS — Z1211 Encounter for screening for malignant neoplasm of colon: Secondary | ICD-10-CM | POA: Diagnosis not present

## 2021-08-21 ENCOUNTER — Other Ambulatory Visit: Payer: Self-pay | Admitting: Family Medicine

## 2021-08-21 DIAGNOSIS — E119 Type 2 diabetes mellitus without complications: Secondary | ICD-10-CM

## 2021-08-21 DIAGNOSIS — E039 Hypothyroidism, unspecified: Secondary | ICD-10-CM

## 2021-08-21 DIAGNOSIS — E785 Hyperlipidemia, unspecified: Secondary | ICD-10-CM

## 2021-08-28 ENCOUNTER — Other Ambulatory Visit: Payer: Self-pay | Admitting: Family Medicine

## 2021-08-28 DIAGNOSIS — E785 Hyperlipidemia, unspecified: Secondary | ICD-10-CM

## 2021-08-28 DIAGNOSIS — M4807 Spinal stenosis, lumbosacral region: Secondary | ICD-10-CM

## 2021-08-28 DIAGNOSIS — E119 Type 2 diabetes mellitus without complications: Secondary | ICD-10-CM

## 2021-09-04 DIAGNOSIS — E538 Deficiency of other specified B group vitamins: Secondary | ICD-10-CM | POA: Diagnosis not present

## 2021-09-08 DIAGNOSIS — M8589 Other specified disorders of bone density and structure, multiple sites: Secondary | ICD-10-CM | POA: Diagnosis not present

## 2021-09-08 DIAGNOSIS — E2839 Other primary ovarian failure: Secondary | ICD-10-CM | POA: Diagnosis not present

## 2021-09-08 DIAGNOSIS — Z1231 Encounter for screening mammogram for malignant neoplasm of breast: Secondary | ICD-10-CM | POA: Diagnosis not present

## 2021-09-08 DIAGNOSIS — M85852 Other specified disorders of bone density and structure, left thigh: Secondary | ICD-10-CM | POA: Diagnosis not present

## 2021-10-09 ENCOUNTER — Other Ambulatory Visit: Payer: Self-pay | Admitting: Family Medicine

## 2021-10-09 DIAGNOSIS — M4807 Spinal stenosis, lumbosacral region: Secondary | ICD-10-CM

## 2021-10-13 DIAGNOSIS — I152 Hypertension secondary to endocrine disorders: Secondary | ICD-10-CM | POA: Diagnosis not present

## 2021-10-13 DIAGNOSIS — E538 Deficiency of other specified B group vitamins: Secondary | ICD-10-CM | POA: Diagnosis not present

## 2021-10-13 DIAGNOSIS — M48061 Spinal stenosis, lumbar region without neurogenic claudication: Secondary | ICD-10-CM | POA: Diagnosis not present

## 2021-10-13 DIAGNOSIS — G894 Chronic pain syndrome: Secondary | ICD-10-CM | POA: Diagnosis not present

## 2021-10-13 DIAGNOSIS — E1159 Type 2 diabetes mellitus with other circulatory complications: Secondary | ICD-10-CM | POA: Diagnosis not present

## 2021-10-13 DIAGNOSIS — Z23 Encounter for immunization: Secondary | ICD-10-CM | POA: Diagnosis not present

## 2021-10-13 DIAGNOSIS — N3281 Overactive bladder: Secondary | ICD-10-CM | POA: Diagnosis not present

## 2021-10-13 DIAGNOSIS — Z86711 Personal history of pulmonary embolism: Secondary | ICD-10-CM | POA: Diagnosis not present

## 2021-11-12 DIAGNOSIS — J0100 Acute maxillary sinusitis, unspecified: Secondary | ICD-10-CM | POA: Diagnosis not present

## 2023-04-21 ENCOUNTER — Other Ambulatory Visit: Payer: Self-pay | Admitting: Physician Assistant

## 2023-04-21 DIAGNOSIS — M25561 Pain in right knee: Secondary | ICD-10-CM

## 2023-04-22 ENCOUNTER — Encounter: Payer: Self-pay | Admitting: Hematology

## 2023-04-28 ENCOUNTER — Encounter: Payer: Self-pay | Admitting: Hematology

## 2023-04-29 ENCOUNTER — Encounter: Payer: Self-pay | Admitting: Hematology

## 2023-04-29 ENCOUNTER — Ambulatory Visit
Admission: RE | Admit: 2023-04-29 | Discharge: 2023-04-29 | Disposition: A | Discharge status: Home / Self Care | Payer: Worker's Compensation | Source: Ambulatory Visit | Attending: Physician Assistant | Admitting: Physician Assistant

## 2023-04-29 DIAGNOSIS — M25561 Pain in right knee: Secondary | ICD-10-CM

## 2023-08-27 ENCOUNTER — Other Ambulatory Visit: Payer: Self-pay

## 2023-08-27 ENCOUNTER — Emergency Department (HOSPITAL_COMMUNITY)
Admission: EM | Admit: 2023-08-27 | Discharge: 2023-08-27 | Disposition: A | Discharge status: Home / Self Care | Payer: PPO | Attending: Emergency Medicine | Admitting: Emergency Medicine

## 2023-08-27 ENCOUNTER — Emergency Department (HOSPITAL_COMMUNITY): Payer: PPO

## 2023-08-27 ENCOUNTER — Encounter (HOSPITAL_COMMUNITY): Payer: Self-pay

## 2023-08-27 DIAGNOSIS — Z7901 Long term (current) use of anticoagulants: Secondary | ICD-10-CM | POA: Diagnosis not present

## 2023-08-27 DIAGNOSIS — Z79899 Other long term (current) drug therapy: Secondary | ICD-10-CM | POA: Diagnosis not present

## 2023-08-27 DIAGNOSIS — R0789 Other chest pain: Secondary | ICD-10-CM | POA: Diagnosis not present

## 2023-08-27 DIAGNOSIS — R413 Other amnesia: Secondary | ICD-10-CM | POA: Diagnosis not present

## 2023-08-27 DIAGNOSIS — E876 Hypokalemia: Secondary | ICD-10-CM | POA: Diagnosis present

## 2023-08-27 DIAGNOSIS — R454 Irritability and anger: Secondary | ICD-10-CM | POA: Insufficient documentation

## 2023-08-27 LAB — CBC
HCT: 36.5 % (ref 36.0–46.0)
Hemoglobin: 12 g/dL (ref 12.0–15.0)
MCH: 26.7 pg (ref 26.0–34.0)
MCHC: 32.9 g/dL (ref 30.0–36.0)
MCV: 81.3 fL (ref 80.0–100.0)
Platelets: 462 10*3/uL — ABNORMAL HIGH (ref 150–400)
RBC: 4.49 MIL/uL (ref 3.87–5.11)
RDW: 14.7 % (ref 11.5–15.5)
WBC: 9.6 10*3/uL (ref 4.0–10.5)
nRBC: 0 % (ref 0.0–0.2)

## 2023-08-27 LAB — TROPONIN I (HIGH SENSITIVITY)
Troponin I (High Sensitivity): 13 ng/L (ref <18)
Troponin I (High Sensitivity): 11 ng/L (ref <18)

## 2023-08-27 LAB — BASIC METABOLIC PANEL
Anion gap: 14 (ref 5–15)
BUN: <5 mg/dL — ABNORMAL LOW (ref 8–23)
CO2: 35 mmol/L — ABNORMAL HIGH (ref 22–32)
Calcium: 8.6 mg/dL — ABNORMAL LOW (ref 8.9–10.3)
Chloride: 88 mmol/L — ABNORMAL LOW (ref 98–111)
Creatinine, Ser: 0.73 mg/dL (ref 0.44–1.00)
GFR, Estimated: >60 mL/min (ref >60)
Glucose, Bld: 100 mg/dL — ABNORMAL HIGH (ref 70–99)
Potassium: 2.1 mmol/L — CL (ref 3.5–5.1)
Sodium: 137 mmol/L (ref 135–145)

## 2023-08-27 LAB — MAGNESIUM: Magnesium: 1.6 mg/dL — ABNORMAL LOW (ref 1.7–2.4)

## 2023-08-27 MED ORDER — MAGNESIUM SULFATE IN D5W 1-5 GM/100ML-% IV SOLN
1.0000 g | Freq: Once | INTRAVENOUS | Status: DC
  Filled 2023-08-27: qty 100

## 2023-08-27 MED ORDER — MAGNESIUM OXIDE -MG SUPPLEMENT 400 (240 MG) MG PO TABS
400.0000 mg | ORAL_TABLET | ORAL | Status: AC
  Administered 2023-08-27: 400 mg via ORAL
  Filled 2023-08-27: qty 1

## 2023-08-27 MED ORDER — POTASSIUM CHLORIDE 10 MEQ/100ML IV SOLN
10.0000 meq | INTRAVENOUS | Status: AC
  Administered 2023-08-27 (×2): 10 meq via INTRAVENOUS
  Filled 2023-08-27 (×2): qty 100

## 2023-08-27 MED ORDER — SODIUM CHLORIDE 0.9 % IV BOLUS
500.0000 mL | Freq: Once | INTRAVENOUS | Status: AC
  Administered 2023-08-27: 500 mL via INTRAVENOUS

## 2023-08-27 MED ORDER — POTASSIUM CHLORIDE CRYS ER 20 MEQ PO TBCR
40.0000 meq | EXTENDED_RELEASE_TABLET | Freq: Once | ORAL | Status: AC
  Administered 2023-08-27: 40 meq via ORAL
  Filled 2023-08-27: qty 2

## 2023-08-27 NOTE — Discharge Instructions (Addendum)
Take your potassium supplements as prescribed by your primary care provider.  I recommend that you hold off on Lasix for today and tomorrow and call your primary care office tomorrow to set up an appointment.  I would recommend taking 20 mg of furosemide every other day until you are able to see your primary care doctor in follow-up.  You may benefit from a formal breast exam versus a mammogram for the area on your breast that is very tender to touch.    Follow up with your primary care provider regarding this.

## 2023-08-27 NOTE — ED Triage Notes (Addendum)
Pt came in via POV d/t blood work drawn yesterday after her annual physical showing her Potassium was 2.2. A/Ox4, denies any pain. She reports she had a fall on May 8th & was started on lasix 4 days ago to help with the swelling in her feet since the fall. Also endorses Lt sided CP under her breast that she has had since the 90's.

## 2023-08-27 NOTE — ED Provider Notes (Signed)
[place] EMERGENCY DEPARTMENT AT [facility] Provider Note   CSN: [id number] Arrival date & time: 08/27/23  0909     History {Add pertinent medical, surgical, social history, OB history to HPI:1} Chief Complaint  Patient presents with   Low Potassium   Chest Pain    Kathleen Miller is a 77 y.o. female.   Chest Pain         Home Medications Prior to Admission medications   Medication Sig Start Date End Date Taking? Authorizing Provider  albuterol (PROAIR HFA) 108 (90 Base) MCG/ACT inhaler Inhale 2 puffs into the lungs every 6 (six) hours as needed for wheezing or shortness of breath. 02/13/19   Wanda Plump, MD  amoxicillin (AMOXIL) 500 MG capsule Take 2 capsules (1,000 mg total) by mouth 2 (two) times daily. 01/28/20   Copland, Gwenlyn Found, MD  apixaban (ELIQUIS) 5 MG TABS tablet Take 1 tablet (5 mg total) by mouth 2 (two) times daily. 10/28/20 11/27/20  Copland, Gwenlyn Found, MD  cyclobenzaprine (FLEXERIL) 10 MG tablet Take 1 tablet (10 mg total) by mouth 2 (two) times daily as needed for muscle spasms. 07/24/20   Copland, Gwenlyn Found, MD  furosemide (LASIX) 20 MG tablet Take 2 tablets (40 mg total) by mouth daily. 07/24/20   Copland, Gwenlyn Found, MD  Insulin Pen Needle (PEN NEEDLES) 32G X 5 MM MISC Use one daily for saxenda dose 07/24/20   Copland, Gwenlyn Found, MD  lovastatin (MEVACOR) 20 MG tablet TAKE 1 TABLET BY MOUTH AT BEDTIME 05/18/21   Copland, Gwenlyn Found, MD  metFORMIN (GLUCOPHAGE) 500 MG tablet Take 1 tablet (500 mg total) by mouth 2 (two) times daily with a meal. 09/19/20   Copland, Gwenlyn Found, MD  thyroid (NP THYROID) 60 MG tablet Take 1 tablet (60 mg total) by mouth daily before breakfast. 07/29/20   Copland, Gwenlyn Found, MD  traMADol (ULTRAM) 50 MG tablet TAKE 1 TABLET BY MOUTH EVERY 6 HOURS AS NEEDED FOR MODERATE PAIN 09/19/20   Copland, Gwenlyn Found, MD      Allergies    Nsaids and Tolmetin    Review of Systems   Review of Systems  Cardiovascular:  Positive for  chest pain.    Physical Exam Updated Vital Signs BP 120/60   Pulse 67   Temp 97.6 F (36.4 C) (Oral)   Resp 16   Ht 5\' 3"  (1.6 m)   Wt 84.6 kg   SpO2 92%   BMI 33.02 kg/m  Physical Exam  ED Results / Procedures / Treatments   Labs (all labs ordered are listed, but only abnormal results are displayed) Labs Reviewed  BASIC METABOLIC PANEL - Abnormal; Notable for the following components:      Result Value   Potassium 2.1 (*)    Chloride 88 (*)    CO2 35 (*)    Glucose, Bld 100 (*)    BUN <5 (*)    Calcium 8.6 (*)    All other components within normal limits  CBC - Abnormal; Notable for the following components:   Platelets 462 (*)    All other components within normal limits  MAGNESIUM - Abnormal; Notable for the following components:   Magnesium 1.6 (*)    All other components within normal limits  TROPONIN I (HIGH SENSITIVITY)  TROPONIN I (HIGH SENSITIVITY)    EKG EKG Interpretation Date/Time:  Saturday August 27 2023 09:31:17 EDT Ventricular Rate:  72 PR Interval:  152 QRS Duration:  82 QT  Interval:  406 QTC Calculation: 444 R Axis:   -21  Text Interpretation: Sinus rhythm with marked sinus arrhythmia Septal infarct , age undetermined ST & T wave abnormality, consider inferior ischemia Abnormal ECG When compared with ECG of 17-Dec-2017 15:54, PREVIOUS ECG IS PRESENT Since last tracing rate slower Confirmed by Jacalyn Lefevre [id number]) on 08/27/2023 12:00:06 PM  Radiology CT HEAD WO CONTRAST ( )  Result Date: 08/27/2023 CLINICAL DATA:  Provided history: Head trauma, moderate/severe. Additional history provided: Head trauma two months ago, now with personality changes. EXAM: CT HEAD WITHOUT CONTRAST TECHNIQUE: Contiguous axial images were obtained from the base of the skull through the vertex without intravenous contrast. RADIATION DOSE REDUCTION: This exam was performed according to the departmental dose-optimization program which includes automated exposure  control, adjustment of the mA and/or kV according to patient size and/or use of iterative reconstruction technique. COMPARISON:  None. FINDINGS: Brain: Mild generalized cerebral volume loss. There is no acute intracranial hemorrhage. No demarcated cortical infarct. No extra-axial fluid collection. No evidence of an intracranial mass. No midline shift. Vascular: No hyperdense vessel.  Atherosclerotic calcifications. Skull: No calvarial fracture or aggressive osseous lesion. Sinuses/Orbits: No mass or acute finding within the imaged orbits. No significant paranasal sinus disease at the imaged levels. IMPRESSION: 1. No evidence of an acute intracranial abnormality. 2. Mild generalized cerebral volume loss. Electronically Signed   By: Jackey Loge D.O.   On: 08/27/2023 13:41   DG Chest 2 View  Result Date: 08/27/2023 CLINICAL DATA:  Chest pain. Hypokalemia. Occasional shortness of breath. EXAM: CHEST - 2 VIEW COMPARISON:  Radiographs 08/23/2019 and 02/15/2019.  CT 12/17/2017. FINDINGS: The heart size and mediastinal contours are stable. Stable linear scarring at the left lung base. The lungs are otherwise clear. There is no pleural effusion or pneumothorax. Mild degenerative changes throughout the spine. Surgical clips are noted in the upper abdomen. IMPRESSION: Stable chest. No acute cardiopulmonary process. Electronically Signed   By: Carey Bullocks M.D.   On: 08/27/2023 10:21    Procedures Procedures  {Document cardiac monitor, telemetry assessment procedure when appropriate:1}  Medications Ordered in ED Medications  potassium chloride 10 mEq in 100 mL IVPB (10 mEq Intravenous New Bag/Given 08/27/23 1338)  magnesium sulfate IVPB 1 g 100 mL (has no administration in time range)  potassium chloride SA (KLOR-CON M) CR tablet 40 mEq (40 mEq Oral Given 08/27/23 1207)  sodium chloride 0.9 % bolus 500 mL (500 mLs Intravenous New Bag/Given 08/27/23 1218)    ED Course/ Medical Decision Making/ A&P Clinical  Course as of 08/27/23 1400  Sat Aug 27, 2023  1348 CT HEAD WO CONTRAST ( ) Volume loss otherwise unremarkable [WF]    Clinical Course User Index [WF] Gailen Shelter, PA   {   Click here for ABCD2, HEART and other calculatorsREFRESH Note before signing :1}                              Medical Decision Making Amount and/or Complexity of Data Reviewed Labs: ordered. Radiology: ordered. Decision-making details documented in ED Course.  Risk OTC drugs. Prescription drug management.   ***  {Document critical care time when appropriate:1} {Document review of labs and clinical decision tools ie heart score, Chads2Vasc2 etc:1}  {Document your independent review of radiology images, and any outside records:1} {Document your discussion with family members, caretakers, and with consultants:1} {Document social determinants of health affecting pt's care:1} {Document your decision making why or  why not admission, treatments were needed:1} Final Clinical Impression(s) / ED Diagnoses Final diagnoses:  None    Rx / DC Orders ED Discharge Orders     None

## 2024-03-29 ENCOUNTER — Encounter: Payer: Self-pay | Admitting: Hematology

## 2024-03-29 ENCOUNTER — Ambulatory Visit: Payer: Self-pay | Admitting: Neurology

## 2024-03-29 ENCOUNTER — Telehealth: Payer: Self-pay | Admitting: Neurology

## 2024-03-29 ENCOUNTER — Encounter: Payer: Self-pay | Admitting: Neurology

## 2024-03-29 VITALS — BP 91/46 | HR 65 | Ht 64.0 in | Wt 167.0 lb

## 2024-03-29 DIAGNOSIS — R296 Repeated falls: Secondary | ICD-10-CM

## 2024-03-29 DIAGNOSIS — R269 Unspecified abnormalities of gait and mobility: Secondary | ICD-10-CM | POA: Diagnosis not present

## 2024-03-29 DIAGNOSIS — E538 Deficiency of other specified B group vitamins: Secondary | ICD-10-CM

## 2024-03-29 DIAGNOSIS — R32 Unspecified urinary incontinence: Secondary | ICD-10-CM

## 2024-03-29 DIAGNOSIS — R519 Headache, unspecified: Secondary | ICD-10-CM | POA: Diagnosis not present

## 2024-03-29 DIAGNOSIS — R413 Other amnesia: Secondary | ICD-10-CM

## 2024-03-29 MED ORDER — NORTRIPTYLINE HCL 10 MG PO CAPS: 10.0000 mg | ORAL_CAPSULE | Freq: Every day | ORAL | 3 refills | Status: DC

## 2024-03-29 NOTE — Telephone Encounter (Signed)
 no auth required sent to GI [phone]

## 2024-03-29 NOTE — Progress Notes (Signed)
 [facility]  PATIENT: Kathleen Miller DOB: 02/03/1946  REFERRING DOCTOR OR PCP:  Dr. Daivd Dub SOURCE: patint, notes from PCP, lab results.     _________________________________   HISTORICAL  CHIEF COMPLAINT:  Chief Complaint  Patient presents with   New Patient (Initial Visit)    Pt in 10 alone Pt here for memory and headaches Pt states short term memory is worse  Pt states daily headaches     HISTORY OF PRESENT ILLNESS:  I had the pleasure of seeing your patient, Kathleen Miller, at [facility] for neurologic consultation regarding her memory and gait difficulties since May 2024  She is a 78 year old woman who reports a fall on Apr 06, 2023.   she was making a deposit at the bank and she reports the parking lot had a lot of cracks.  Her foot got caught and she fell, hitting the back of her head.  No LOC though she had headache and felt stunned.   She had difficulty standing up.    She has had additional falls since then.  She has also had knee pain since the fall on the right.      She notes the right leg sometimes gives out due to right knee pain.   She has a couple falls due to poor balance.  Also since that time, she has noted memory difficult.  She notes more difficulty with her cognition than other people do.   She does not note any other cognitive issue besides memory..   She also notes issues with reduced balance/equilibrium and poor sleep since then.  Also since the incident, she has a daily headache in he occiput and the forehead .  Tylenol  ES 1-2 times a day helps a little bit   She finds it harder to focus hen he HA is more severe.   She rarely takes flexeril  10 mg or tramadol  but just a couple/month  Additionally, she reports urinary urgency and incontinence since the fall May 2024  She sleeps poorly since the accident due to the headache and other symptoms.  She has T2IDDM and hypothroidism     03/29/2024    2:32 PM  [place]  Cognitive Assessment   Visuospatial/ Executive (0/5) 4  Naming (0/3) 2  Attention: Read list of digits (0/2) 2  Attention: Read list of letters (0/1) 1  Attention: Serial 7 subtraction starting at 100 (0/3) 3  Language: Repeat phrase (0/2) 1  Language : Fluency (0/1) 0  Abstraction (0/2) 2  Delayed Recall (0/5) 4  Orientation (0/6) 5  Total 24   Labs:   TSH was nl.   HgbA1c was 5.7  in 2025  REVIEW OF SYSTEMS: Constitutional: No fevers, chills, sweats, or change in appetite Eyes: No visual changes, double vision, eye pain Ear, nose and throat: No hearing loss, ear pain, nasal congestion, sore throat Cardiovascular: No chest pain, palpitations Respiratory:  No shortness of breath at rest or with exertion.   No wheezes GastrointestinaI: No nausea, vomiting, diarrhea, abdominal pain, fecal incontinence Genitourinary: She has urinary urgency and incontinence. Musculoskeletal: Knee pain Integumentary: No rash, pruritus, skin lesions Neurological: as above Psychiatric: No depression at this time.  No anxiety Endocrine: No palpitations, diaphoresis, change in appetite, change in weigh or increased thirst Hematologic/Lymphatic:  No anemia, purpura, petechiae. Allergic/Immunologic: No itchy/runny eyes, nasal congestion, recent allergic reactions, rashes  ALLERGIES: Allergies  Allergen Reactions   Nsaids Swelling and Other (See Comments)    Severe  swelling--water retention (including water on brain)   Tolmetin Swelling and Other (See Comments)    Severe swelling-water retention (including water on brain)     HOME MEDICATIONS:  Current Outpatient Medications:    apixaban  (ELIQUIS ) 5 MG TABS tablet, Take 1 tablet by mouth 2 (two) times daily., Disp: , Rfl:    cyclobenzaprine  (FLEXERIL ) 10 MG tablet, Take 1 tablet (10 mg total) by mouth 2 (two) times daily as needed for muscle spasms. (Patient taking differently: Take 10 mg by mouth as needed for muscle spasms.), Disp: 30 tablet, Rfl:  1   furosemide  (LASIX ) 20 MG tablet, Take 2 tablets (40 mg total) by mouth daily. (Patient taking differently: Take 40 mg by mouth once a week. Takes on saturday), Disp: 180 tablet, Rfl: 3   lovastatin  (MEVACOR ) 20 MG tablet, TAKE 1 TABLET BY MOUTH AT BEDTIME, Disp: 90 tablet, Rfl: 0   thyroid  (NP THYROID ) 60 MG tablet, Take 1 tablet (60 mg total) by mouth daily before breakfast., Disp: 30 tablet, Rfl: 5   traMADol  (ULTRAM ) 50 MG tablet, TAKE 1 TABLET BY MOUTH EVERY 6 HOURS AS NEEDED FOR MODERATE PAIN (Patient taking differently: as needed.), Disp: 30 tablet, Rfl: 1   albuterol  (PROAIR  HFA) 108 (90 Base) MCG/ACT inhaler, Inhale 2 puffs into the lungs every 6 (six) hours as needed for wheezing or shortness of breath. (Patient not taking: Reported on 03/29/2024), Disp: 18 g, Rfl: 5   amoxicillin  (AMOXIL ) 500 MG capsule, Take 2 capsules (1,000 mg total) by mouth 2 (two) times daily., Disp: 40 capsule, Rfl: 0   apixaban  (ELIQUIS ) 5 MG TABS tablet, Take 1 tablet (5 mg total) by mouth 2 (two) times daily., Disp: 180 tablet, Rfl: 3   Insulin  Pen Needle (PEN NEEDLES) 32G X 5 MM MISC, Use one daily for saxenda  dose, Disp: 100 each, Rfl: 3   metFORMIN  (GLUCOPHAGE ) 500 MG tablet, Take 1 tablet (500 mg total) by mouth 2 (two) times daily with a meal., Disp: 180 tablet, Rfl: 1  PAST MEDICAL HISTORY: Past Medical History:  Diagnosis Date   Chronic back pain    Hypothyroidism    Pneumonia    Pre-diabetes    takes metformin  preventatively   Ventral hernia     PAST SURGICAL HISTORY: Past Surgical History:  Procedure Laterality Date   ABDOMINAL HYSTERECTOMY  11/2011   CHOLECYSTECTOMY  1988   COLONOSCOPY WITH PROPOFOL  N/A 05/27/2016   Procedure: COLONOSCOPY WITH PROPOFOL ;  Surgeon: Baldo Bonds, MD;  Location: [facility];  Service: Endoscopy;  Laterality: N/A;   DILATION AND CURETTAGE OF UTERUS     x2   ESOPHAGOGASTRODUODENOSCOPY (EGD) WITH PROPOFOL  N/A 05/27/2016   Procedure:  ESOPHAGOGASTRODUODENOSCOPY (EGD) WITH PROPOFOL ;  Surgeon: Baldo Bonds, MD;  Location: [facility];  Service: Endoscopy;  Laterality: N/A;   GASTRIC BYPASS     GASTRIC ROUX-EN-Y N/A 12/13/2017   Procedure: LAPAROSCOPIC ASSTED VENTRAL HERNIA REPAIR, Upper Endo;  Surgeon: Ayesha Lente, MD;  Location: [facility] ORS;  Service: General;  Laterality: N/A;  With MESH   LEFT HEART CATH AND CORONARY ANGIOGRAPHY N/A 08/05/2017   Procedure: LEFT HEART CATH AND CORONARY ANGIOGRAPHY;  Surgeon: Swaziland, Peter M, MD;  Location: [facility] INVASIVE CV LAB;  Service: Cardiovascular;  Laterality: N/A;   revision gastric bypass     and ventrel hernia repair  Dr. Alray Askew 12-13-17   SPLENECTOMY, TOTAL  1988    FAMILY HISTORY: Family History  Problem Relation Age of Onset   Alzheimer's disease Neg Hx  Dementia Neg Hx    Migraines Neg Hx     SOCIAL HISTORY: Social History   Socioeconomic History   Marital status: Divorced    Spouse name: Not on file   Number of children: Not on file   Years of education: Not on file   Highest education level: Not on file  Occupational History   Not on file  Tobacco Use   Smoking status: Never   Smokeless tobacco: Never  Vaping Use   Vaping status: Never Used  Substance and Sexual Activity   Alcohol  use: No   Drug use: No   Sexual activity: Never  Other Topics Concern   Not on file  Social History Narrative   Pt lives alone    Pt works    Social Drivers of Corporate investment banker Strain: Low Risk  (01/04/2024)   Received from [employer]   Overall Financial Resource Strain (CARDIA)    Difficulty of Paying Living Expenses: Not hard at all  Food Insecurity: No Food Insecurity (01/04/2024)   Received from [facility]   Hunger Vital Sign    Worried About Running Out of Food in the Last Year: Never true    Ran Out of Food in the Last Year: Never true  Transportation Needs: No Transportation Needs (01/04/2024)   Received [facility] -  Transportation    Lack of Transportation (Medical): No    Lack of Transportation (Non-Medical): No  Physical Activity: Inactive (08/26/2023)   Received from [facility]   Exercise Vital Sign    Days of Exercise per Week: 0 days    Minutes of Exercise per Session: 0 min  Stress: Stress Concern Present (08/27/2022)   Received from [facility], [facility] of Occupational Health - Occupational Stress Questionnaire    Feeling of Stress : To some extent  Social Connections: Unknown (08/28/2023)   Received from [facility]   Social Network    Social Network: Not on file  Intimate Partner Violence: Unknown (03/04/2022)   Received from [facility]   HITS    Physically Hurt: Not on file    Insult or Talk Down To: Not on file    Threaten Physical Harm: Not on file    Scream or Curse: Not on file       PHYSICAL EXAM  Vitals:   03/29/24 1420  BP: (!) 91/46  Pulse: 65  Weight: 167 lb (75.8 kg)  Height: 5\' 4"  (1.626 m)    Body mass index is 28.67 kg/m.   General: The patient is well-developed and well-nourished and in no acute distress  HEENT:  Head is [place]/AT.  Sclera are anicteric.  Funduscopic exam shows normal optic discs and retinal vessels.  Some tenderness right above the occiput in the posterior skull  Neck: No carotid bruits are noted.  The neck is nontender.  Cardiovascular: The heart has a regular rate and rhythm with a normal S1 and S2. There were no murmurs, gallops or rubs.    Skin: Extremities are without rash or  edema.  Musculoskeletal: Right greater than left knee tenderness  Neurologic Exam  Mental status: The patient is alert and oriented x 2 1/2 at the time of the examination.  She has reduced focus and short-term memory.Aaron Aas   Speech is normal.  Cranial nerves: Extraocular movements are full.  Facial symmetry is present. There is good facial sensation to soft touch bilaterally.Facial strength is normal.  Trapezius  and sternocleidomastoid strength is normal. No dysarthria is noted.  The tongue is midline, and the patient has symmetric elevation of the soft palate. No obvious hearing deficits are noted.  Motor:  Muscle bulk is normal.   Tone is normal. Strength is  5 / 5 in all 4 extremities.   Sensory: Sensory testing is intact in arms, reduced right leg vibration relative to left and reduced left leg touch/temp compared to left.  Coordination: Cerebellar testing reveals good finger-nose-finger and heel-to-shin bilaterally.  Gait and station: Requires support to rise from chair.   Station is normal.   Has reduced stride and poor turn without walker.  Better with walker.   Romberg is negative.   Reflexes: Deep tendon reflexes are symmetric and normal in arms and 1 at knees and ankles.   Plantar responses are flexor.    DIAGNOSTIC DATA (LABS, IMAGING, TESTING) - I reviewed patient records, labs, notes, testing and imaging myself where available.  Lab Results  Component Value Date   WBC 9.6 08/27/2023   HGB 12.0 08/27/2023   HCT 36.5 08/27/2023   MCV 81.3 08/27/2023   PLT 462 (H) 08/27/2023      Component Value Date/Time   NA 137 08/27/2023 0920   NA 139 07/29/2017 0823   K 2.1 (LL) 08/27/2023 0920   CL 88 (L) 08/27/2023 0920   CO2 35 (H) 08/27/2023 0920   GLUCOSE 100 (H) 08/27/2023 0920   BUN <5 (L) 08/27/2023 0920   BUN 10 07/29/2017 0823   CREATININE 0.73 08/27/2023 0920   CREATININE 0.86 10/11/2019 0819   CREATININE 0.63 03/19/2015 1254   CALCIUM 8.6 (L) 08/27/2023 0920   PROT 7.1 05/21/2020 1136   ALBUMIN 4.0 05/21/2020 1136   AST 20 05/21/2020 1136   AST 14 (L) 10/11/2019 0819   ALT 18 05/21/2020 1136   ALT 12 10/11/2019 0819   ALKPHOS 115 05/21/2020 1136   BILITOT 0.5 05/21/2020 1136   BILITOT 0.3 10/11/2019 0819   GFRNONAA >60 08/27/2023 0920   GFRNONAA >60 10/11/2019 0819   GFRAA >60 10/11/2019 0819   Lab Results  Component Value Date   CHOL 133 05/21/2020   HDL  32.40 (L) 05/21/2020   LDLCALC 68 05/21/2020   LDLDIRECT 105.0 01/31/2019   TRIG 162.0 (H) 05/21/2020   CHOLHDL 4 05/21/2020   Lab Results  Component Value Date   HGBA1C 6.0 05/21/2020   Lab Results  Component Value Date   VITAMINB12 158 (L) 05/21/2020   Lab Results  Component Value Date   TSH 2.21 05/21/2020       ASSESSMENT AND PLAN  Gait disturbance - Plan: MR BRAIN WO CONTRAST, MR CERVICAL SPINE WO CONTRAST  Falls - Plan: MR BRAIN WO CONTRAST, MR CERVICAL SPINE WO CONTRAST  Chronic daily headache - Plan: MR BRAIN WO CONTRAST, MR CERVICAL SPINE WO CONTRAST  Urinary incontinence, unspecified type - Plan: MR BRAIN WO CONTRAST, MR CERVICAL SPINE WO CONTRAST  Memory loss - Plan: ATN PROFILE, Vitamin B12  B12 deficiency - Plan: Vitamin B12   In summary, Kathleen Miller is a 78 year old woman who reports multiple symptoms since of fall in May 2024.  Specifically she has noted worsening gait, headache, memory loss, urinary urgency/incontinence and insomnia.  Her exam shows altered sensation and poor gait with reduced stride.  Due to the difficulties with gait and bladder, I am concerned that she could have a compressive myelopathy and we need to check an MRI of the cervical spine to  further evaluate this.  Additionally, since symptoms happened after the fall we need to check MRI of the brain to determine if there were any chronic subdural hematomas (or hygroma) or other finding.   Difficulties with her memory could be due to reduced focus due to her poor sleep and headache.  We will also check vitamin B12 and Alzheimer biomarkers to further evaluate.   I will place on low-dose nortriptyline  to help with the headaches.  She will return to see me in 6 months or as needed due to new or worsening neurologic symptoms.  Based on the results of the findings we may need to bring her in sooner or refer for further evaluation and treatment.  Thank you for asking to see Kathleen Miller.  Please let  me know if I can be of further assistance with her other patients in the future.   This visit is part of a comprehensive longitudinal care medical relationship regarding the patients primary diagnosis of gait disturbance and related concerns.   Kathleen Miller A. Godwin Lat, MD, [facility] 03/29/2024, 2:43 PM Certified in Neurology, Clinical Neurophysiology, Sleep Medicine and Neuroimaging  [facility] [address] [place], [place] [address] [phone]

## 2024-04-03 LAB — ATN PROFILE
A -- Beta-amyloid 42/40 Ratio: 0.104 (ref >0.102)
Beta-amyloid 40: 309.28 pg/mL
Beta-amyloid 42: 32.22 pg/mL
N -- NfL, Plasma: 5.14 pg/mL (ref 0.00–6.04)
T -- p-tau181: 3.29 pg/mL — ABNORMAL HIGH (ref 0.00–0.97)

## 2024-04-03 LAB — VITAMIN B12: Vitamin B-12: 214 pg/mL — ABNORMAL LOW (ref 232–1245)

## 2024-04-04 ENCOUNTER — Telehealth: Payer: Self-pay | Admitting: *Deleted

## 2024-04-04 NOTE — Telephone Encounter (Signed)
 Faxed completed form below with office note. Received fax confirmation.

## 2024-04-12 NOTE — Telephone Encounter (Signed)
 [facility]) have not received the form. Can you please refax it?

## 2024-04-12 NOTE — Telephone Encounter (Signed)
 Abran Abrahams, can you please resend to [facility]? Thank you

## 2024-04-12 NOTE — Telephone Encounter (Signed)
 Thank you :)

## 2024-04-18 ENCOUNTER — Ambulatory Visit
Admission: RE | Admit: 2024-04-18 | Discharge: 2024-04-18 | Disposition: A | Discharge status: Home / Self Care | Source: Ambulatory Visit | Attending: Neurology | Admitting: Neurology

## 2024-04-18 DIAGNOSIS — R296 Repeated falls: Secondary | ICD-10-CM

## 2024-04-18 DIAGNOSIS — R32 Unspecified urinary incontinence: Secondary | ICD-10-CM | POA: Diagnosis not present

## 2024-04-18 DIAGNOSIS — R269 Unspecified abnormalities of gait and mobility: Secondary | ICD-10-CM

## 2024-04-18 DIAGNOSIS — R519 Headache, unspecified: Secondary | ICD-10-CM

## 2024-04-19 ENCOUNTER — Ambulatory Visit: Payer: Self-pay | Admitting: Neurology

## 2024-04-19 DIAGNOSIS — R269 Unspecified abnormalities of gait and mobility: Secondary | ICD-10-CM

## 2024-04-20 NOTE — Telephone Encounter (Signed)
 Patient informed with the below, states she would like a referral placed at [facility] physical therapy:     Referral placed.  Patient mentioned she is having trouble with her back when bending and would like a back brace. Pt said she knows a place in [place] that sell great back braces but may need a Rx for this. I asked patient to find out the type of back brace she needs and let us  know, she asked if Dr.Sater would provider an order for one so insurance will pay.

## 2024-04-20 NOTE — Telephone Encounter (Signed)
-----   Message from [name] sent at 04/19/2024  6:23 PM EDT ----- Please let her know that the MRI of the brain shows age-related changes but there was nothing that looked new or recent  MRI of the cervical spine showed some degenerative changes with some disc bulges and bone spurs.  Although the changes could cause some neck and arm pain, there did not appear to be any changes that would affect gait or cause falls.  If she has not already done so, I recommend that she do physical therapy to help with gait

## 2024-04-24 ENCOUNTER — Telehealth: Payer: Self-pay | Admitting: Neurology

## 2024-04-24 NOTE — Telephone Encounter (Signed)
 Referral to Physical Therapy  Faxed to [facility]   Phone #: [phone] Fax # [phone]

## 2024-04-25 ENCOUNTER — Other Ambulatory Visit: Payer: Self-pay | Admitting: Orthopedic Surgery

## 2024-04-27 ENCOUNTER — Encounter (HOSPITAL_COMMUNITY): Payer: Self-pay

## 2024-04-27 ENCOUNTER — Other Ambulatory Visit: Payer: Self-pay

## 2024-04-27 ENCOUNTER — Emergency Department (HOSPITAL_COMMUNITY)
Admission: EM | Admit: 2024-04-27 | Discharge: 2024-04-27 | Discharge status: Against Medical Advice | Attending: Emergency Medicine | Admitting: Emergency Medicine

## 2024-04-27 DIAGNOSIS — R600 Localized edema: Secondary | ICD-10-CM | POA: Diagnosis not present

## 2024-04-27 DIAGNOSIS — R197 Diarrhea, unspecified: Secondary | ICD-10-CM | POA: Insufficient documentation

## 2024-04-27 DIAGNOSIS — E871 Hypo-osmolality and hyponatremia: Secondary | ICD-10-CM | POA: Diagnosis not present

## 2024-04-27 DIAGNOSIS — Z5329 Procedure and treatment not carried out because of patient's decision for other reasons: Secondary | ICD-10-CM | POA: Insufficient documentation

## 2024-04-27 DIAGNOSIS — R531 Weakness: Secondary | ICD-10-CM | POA: Diagnosis present

## 2024-04-27 DIAGNOSIS — I959 Hypotension, unspecified: Secondary | ICD-10-CM | POA: Diagnosis not present

## 2024-04-27 DIAGNOSIS — R944 Abnormal results of kidney function studies: Secondary | ICD-10-CM | POA: Diagnosis not present

## 2024-04-27 DIAGNOSIS — E876 Hypokalemia: Secondary | ICD-10-CM | POA: Diagnosis not present

## 2024-04-27 DIAGNOSIS — Z7901 Long term (current) use of anticoagulants: Secondary | ICD-10-CM | POA: Insufficient documentation

## 2024-04-27 LAB — BASIC METABOLIC PANEL WITH GFR
Anion gap: 12 (ref 5–15)
Anion gap: 9 (ref 5–15)
BUN: 13 mg/dL (ref 8–23)
BUN: 11 mg/dL (ref 8–23)
CO2: 38 mmol/L — ABNORMAL HIGH (ref 22–32)
CO2: 36 mmol/L — ABNORMAL HIGH (ref 22–32)
Calcium: 7.8 mg/dL — ABNORMAL LOW (ref 8.9–10.3)
Calcium: 7.3 mg/dL — ABNORMAL LOW (ref 8.9–10.3)
Chloride: 79 mmol/L — ABNORMAL LOW (ref 98–111)
Chloride: 86 mmol/L — ABNORMAL LOW (ref 98–111)
Creatinine, Ser: 1.25 mg/dL — ABNORMAL HIGH (ref 0.44–1.00)
Creatinine, Ser: 1.06 mg/dL — ABNORMAL HIGH (ref 0.44–1.00)
GFR, Estimated: 45 mL/min — ABNORMAL LOW (ref >60)
GFR, Estimated: 55 mL/min — ABNORMAL LOW (ref >60)
Glucose, Bld: 104 mg/dL — ABNORMAL HIGH (ref 70–99)
Glucose, Bld: 92 mg/dL (ref 70–99)
Potassium: <2 mmol/L — CL (ref 3.5–5.1)
Potassium: 2.1 mmol/L — CL (ref 3.5–5.1)
Sodium: 129 mmol/L — ABNORMAL LOW (ref 135–145)
Sodium: 131 mmol/L — ABNORMAL LOW (ref 135–145)

## 2024-04-27 LAB — HEPATIC FUNCTION PANEL
ALT: 36 U/L (ref 0–44)
AST: 37 U/L (ref 15–41)
Albumin: 2.8 g/dL — ABNORMAL LOW (ref 3.5–5.0)
Alkaline Phosphatase: 56 U/L (ref 38–126)
Bilirubin, Direct: 0.2 mg/dL (ref 0.0–0.2)
Indirect Bilirubin: 0.8 mg/dL (ref 0.3–0.9)
Total Bilirubin: 1 mg/dL (ref 0.0–1.2)
Total Protein: 5.7 g/dL — ABNORMAL LOW (ref 6.5–8.1)

## 2024-04-27 LAB — CBC
HCT: 31.2 % — ABNORMAL LOW (ref 36.0–46.0)
Hemoglobin: 10.7 g/dL — ABNORMAL LOW (ref 12.0–15.0)
MCH: 27.7 pg (ref 26.0–34.0)
MCHC: 34.3 g/dL (ref 30.0–36.0)
MCV: 80.8 fL (ref 80.0–100.0)
Platelets: 423 10*3/uL — ABNORMAL HIGH (ref 150–400)
RBC: 3.86 MIL/uL — ABNORMAL LOW (ref 3.87–5.11)
RDW: 14.3 % (ref 11.5–15.5)
WBC: 8.5 10*3/uL (ref 4.0–10.5)
nRBC: 0 % (ref 0.0–0.2)

## 2024-04-27 LAB — LIPASE, BLOOD: Lipase: 35 U/L (ref 11–51)

## 2024-04-27 LAB — MAGNESIUM: Magnesium: 1.5 mg/dL — ABNORMAL LOW (ref 1.7–2.4)

## 2024-04-27 MED ORDER — POTASSIUM CHLORIDE CRYS ER 20 MEQ PO TBCR: 20.0000 meq | EXTENDED_RELEASE_TABLET | Freq: Two times a day (BID) | ORAL | 0 refills | Status: DC

## 2024-04-27 MED ORDER — POTASSIUM CHLORIDE CRYS ER 20 MEQ PO TBCR
40.0000 meq | EXTENDED_RELEASE_TABLET | Freq: Once | ORAL | Status: AC
  Administered 2024-04-27: 40 meq via ORAL
  Filled 2024-04-27: qty 2

## 2024-04-27 MED ORDER — POLYVINYL ALCOHOL 1.4 % OP SOLN
1.0000 [drp] | OPHTHALMIC | Status: DC | PRN
  Administered 2024-04-27: 1 [drp] via OPHTHALMIC
  Filled 2024-04-27: qty 15

## 2024-04-27 MED ORDER — MAGNESIUM SULFATE 2 GM/50ML IV SOLN
2.0000 g | Freq: Once | INTRAVENOUS | Status: AC
  Administered 2024-04-27: 2 g via INTRAVENOUS
  Filled 2024-04-27: qty 50

## 2024-04-27 MED ORDER — MAGNESIUM OXIDE 400 MG PO TABS: 400.0000 mg | ORAL_TABLET | Freq: Every day | ORAL | 0 refills | Status: AC

## 2024-04-27 MED ORDER — POTASSIUM CHLORIDE 10 MEQ/100ML IV SOLN
10.0000 meq | INTRAVENOUS | Status: AC
  Administered 2024-04-27 (×2): 10 meq via INTRAVENOUS
  Filled 2024-04-27 (×2): qty 100

## 2024-04-27 MED ORDER — POTASSIUM CHLORIDE 10 MEQ/100ML IV SOLN
10.0000 meq | INTRAVENOUS | Status: AC
  Administered 2024-04-27 (×4): 10 meq via INTRAVENOUS
  Filled 2024-04-27 (×4): qty 100

## 2024-04-27 MED ORDER — SODIUM CHLORIDE 0.9 % IV BOLUS
1000.0000 mL | Freq: Once | INTRAVENOUS | Status: AC
  Administered 2024-04-27: 1000 mL via INTRAVENOUS

## 2024-04-27 NOTE — ED Notes (Addendum)
 Lab called with a potassium level of 2.1. This RN informed Dr. Monnie Anthony. Pt did sign out AMA earlier.

## 2024-04-27 NOTE — Discharge Instructions (Addendum)
 I recommended you stay in the hospital for your dangerously low potassium levels as well as your low blood pressure.  You did not want to stay in the hospital and chose to leave against medical advice.  You understand that low potassium which at risk of serious conditions including heart failure and sudden death.  You will need to follow-up with your doctor to have your levels rechecked this week.  You should STOP taking furosemide  (lasix ), which can lower your blood pressure and your potassium levels.  You can discuss this medication the next time you see your primary care doctor.

## 2024-04-27 NOTE — ED Notes (Signed)
 CRITICAL VALUE STICKER  CRITICAL VALUE: Potassium < 2.0  RECEIVER (on-site recipient of call):I. Jule Schlabach  DATE & TIME NOTIFIED: 04/27/24 1010  MESSENGER (representative from lab):Lab  MD NOTIFIED: Trifan  TIME OF NOTIFICATION:1012  RESPONSE:

## 2024-04-27 NOTE — ED Provider Notes (Signed)
 [place] [facility] DEPARTMENT AT [facility] Provider Note   CSN: [id number] Arrival date & time: 04/27/24  7829     History  Chief Complaint  Patient presents with   Weakness    Pt presents today s/p pcp appt yesterday with lab work K+ 1.7, also states potassium is low. Sought PCP appt for progressive weakness and sob/cp (there for a year) over the last week. +Troussau's sign    Kathleen Miller is a 78 y.o. female presenting to ED with generalized weakness, poor appetite and diarrhea.  Patient said that she had blood work done by her doctor and was contacted today that her potassium was "dangerously low" at 1.7 and need to come to the ER.  She was also told her sodium levels were low.  She reports for the past month she has not had very little appetite, which is an abrupt change.  She says she will some days only eat a little bit of corn.  She says she feels nauseated and is either vomiting or dry heaving after eating.  She has also had persistent diarrhea.  She says she feels lightheaded, weak, difficulty standing up.  I reviewed her PCP evaluation from yesterday in the office.  There was concern about Ozempic being a potential cause of the patient's vomiting, although the Ozempic was discontinued 3 weeks ago, is noted to take 2-1/2 weeks to clear out of the system.  Patient was noted of chronic lymphedema of the lower extremities.  Her labs had an A1c level of 5.1 yesterday, and a hemoglobin of 10.2.  Patient does take Lasix  20 mg a day for lower extremity edema.    HPI     Home Medications Prior to Admission medications   Medication Sig Start Date End Date Taking? Authorizing Provider  albuterol  (PROAIR  HFA) 108 (90 Base) MCG/ACT inhaler Inhale 2 puffs into the lungs every 6 (six) hours as needed for wheezing or shortness of breath. 02/13/19  Yes Paz, Anitra Ket, MD  apixaban  (ELIQUIS ) 5 MG TABS tablet Take 1 tablet by mouth 2 (two) times daily. 05/10/18  Yes [provider]  furosemide  (LASIX ) 20 MG tablet Take 2 tablets (40 mg total) by mouth daily. Patient taking differently: Take 40 mg by mouth See admin instructions. Take 40 mg by mouth once weekly on Sunday as needed for fluid 07/24/20  Yes Copland, Skipper Dumas, MD  lovastatin  (MEVACOR ) 20 MG tablet TAKE 1 TABLET BY MOUTH AT BEDTIME 05/18/21  Yes Copland, Skipper Dumas, MD  magnesium  oxide (MAG-OX) 400 MG tablet Take 1 tablet (400 mg total) by mouth daily for 20 days. 04/27/24 05/17/24 Yes Eli Pattillo, Janalyn Me, MD  Potassium Chloride  ER 20 MEQ TBCR Take 1 tablet by mouth daily.   Yes [provider]  potassium chloride  SA (KLOR-CON  M) 20 MEQ tablet Take 1 tablet (20 mEq total) by mouth 2 (two) times daily for 20 days. 04/27/24 05/17/24 Yes Carlus Stay, Janalyn Me, MD  thyroid  (NP THYROID ) 60 MG tablet Take 1 tablet (60 mg total) by mouth daily before breakfast. 07/29/20  Yes Copland, Skipper Dumas, MD  traMADol  (ULTRAM ) 50 MG tablet TAKE 1 TABLET BY MOUTH EVERY 6 HOURS AS NEEDED FOR MODERATE PAIN Patient taking differently: Take 50 mg by mouth as needed for moderate pain (pain score 4-6) or severe pain (pain score 7-10). 09/19/20  Yes Copland, Jessica C, MD  Vitamin D, Ergocalciferol, (DRISDOL) 1.25 MG (50000 UNIT) CAPS capsule Take 50,000 Units by mouth every 7 (  seven) days.   Yes [provider]  meloxicam (MOBIC) 15 MG tablet Take 15 mg by mouth daily. Patient not taking: Reported on 04/27/2024    [provider]  minoxidil (LONITEN) 2.5 MG tablet Take 2.5 mg by mouth daily. Patient not taking: Reported on 04/27/2024    [provider]  nortriptyline  (PAMELOR ) 10 MG capsule Take 1 capsule (10 mg total) by mouth at bedtime. Patient not taking: Reported on 04/27/2024 03/29/24   Sater, Sherida Dimmer, MD  OZEMPIC, 1 MG/DOSE, 4 MG/3ML SOPN Inject 1 mg into the skin once a week. Patient not taking: Reported on 04/27/2024    [provider]      Allergies    Nsaids and Tolmetin    Review  of Systems   Review of Systems  Physical Exam Updated Vital Signs BP (!) 98/54   Pulse (!) 58   Temp 97.9 F (36.6 C) (Oral)   Resp 17   Ht 5\' 4"  (1.626 m)   Wt 75.8 kg   SpO2 96%   BMI 28.68 kg/m  Physical Exam Constitutional:      General: She is not in acute distress.    Comments: Thin, frail  HENT:     Head: Normocephalic and atraumatic.  Eyes:     Conjunctiva/sclera: Conjunctivae normal.     Pupils: Pupils are equal, round, and reactive to light.  Cardiovascular:     Rate and Rhythm: Normal rate and regular rhythm.  Pulmonary:     Effort: Pulmonary effort is normal. No respiratory distress.  Abdominal:     General: There is no distension.     Tenderness: There is no abdominal tenderness.  Musculoskeletal:     Right lower leg: Edema present.     Left lower leg: Edema present.  Skin:    General: Skin is warm and dry.  Neurological:     General: No focal deficit present.     Mental Status: She is alert. Mental status is at baseline.  Psychiatric:        Mood and Affect: Mood normal.        Behavior: Behavior normal.     ED Results / Procedures / Treatments   Labs (all labs ordered are listed, but only abnormal results are displayed) Labs Reviewed  BASIC METABOLIC PANEL WITH GFR - Abnormal; Notable for the following components:      Result Value   Sodium 129 (*)    Potassium <2.0 (*)    Chloride 79 (*)    CO2 38 (*)    Glucose, Bld 104 (*)    Creatinine, Ser 1.25 (*)    Calcium 7.8 (*)    GFR, Estimated 45 (*)    All other components within normal limits  CBC - Abnormal; Notable for the following components:   RBC 3.86 (*)    Hemoglobin 10.7 (*)    HCT 31.2 (*)    Platelets 423 (*)    All other components within normal limits  MAGNESIUM  - Abnormal; Notable for the following components:   Magnesium  1.5 (*)    All other components within normal limits  HEPATIC FUNCTION PANEL - Abnormal; Notable for the following components:   Total Protein 5.7 (*)     Albumin 2.8 (*)    All other components within normal limits  LIPASE, BLOOD  BASIC METABOLIC PANEL WITH GFR    EKG EKG Interpretation Date/Time:  Friday Apr 27 2024 09:23:57 EDT Ventricular Rate:  56 PR Interval:  166  QRS Duration:  95 QT Interval:  441 QTC Calculation: 426 R Axis:   9  Text Interpretation: Sinus rhythm Low voltage, precordial leads Borderline repolarization abnormality Confirmed by Jerald Molly [phone]) on 04/27/2024 9:32:50 AM  Radiology No results found.  Procedures .Critical Care  Performed by: Arvilla Birmingham, MD Authorized by: Arvilla Birmingham, MD   Critical care provider statement:    Critical care time (minutes):  45   Critical care time was exclusive of:  Separately billable procedures and treating other patients   Critical care was necessary to treat or prevent imminent or life-threatening deterioration of the following conditions:  Metabolic crisis   Critical care was time spent personally by me on the following activities:  Ordering and performing treatments and interventions, ordering and review of laboratory studies, ordering and review of radiographic studies, pulse oximetry, review of old charts, examination of patient and evaluation of patient's response to treatment     Medications Ordered in ED Medications  potassium chloride  10 mEq in 100 mL IVPB (10 mEq Intravenous New Bag/Given 04/27/24 1551)  polyvinyl alcohol  (LIQUIFILM TEARS) 1.4 % ophthalmic solution 1 drop (1 drop Both Eyes Given 04/27/24 1552)  sodium chloride  0.9 % bolus 1,000 mL (0 mLs Intravenous Stopped 04/27/24 1208)  potassium chloride  SA (KLOR-CON  M) CR tablet 40 mEq (40 mEq Oral Given 04/27/24 0921)  potassium chloride  10 mEq in 100 mL IVPB (0 mEq Intravenous Stopped 04/27/24 1116)  magnesium  sulfate IVPB 2 g 50 mL (0 g Intravenous Stopped 04/27/24 1148)    ED Course/ Medical Decision Making/ A&P                                 Medical Decision Making Amount and/or  Complexity of Data Reviewed Labs: ordered. ECG/medicine tests: ordered.  Risk OTC drugs. Prescription drug management.   This patient presents to the ED with concern for generalized weakness, nausea, vomiting, diarrhea. This involves an extensive number of treatment options, and is a complaint that carries with it a high risk of complications and morbidity.  The differential diagnosis includes medication side effect of Ozempic versus other intra-abdominal process including pancreatitis or biliary disease or malignancy  Hypokalemia is most likely due to poor oral intake as well as diarrhea.  This may also affect her sodium level she has not been eating much.  Will need to recheck her labs here.  Patient is also noted to be borderline hypotensive here, although she is typically on blood pressure medicines for hypertension.  Again this may be related to dehydration.  I have ordered some IV fluids  External records from outside source obtained and reviewed including PCP office evaluation from yesterday  I ordered and personally interpreted labs.  The pertinent results include:  K < 2, Na 129, Cr 1.25, Alb 2.8  I recommended CT imaging of the abdomen to evaluate for potential cause of the patient's change and appetite, vomiting and diarrhea.  These could include more life-threatening or serious conditions such as pancreatitis, colitis, and malignancy. The patient refuses CT scan citing concerns about cost and co-pay.     The patient was maintained on a cardiac monitor.  I personally viewed and interpreted the cardiac monitored which showed an underlying rhythm of: Sinus rhythm and sinus bradycardia  Per my interpretation the patient's ECG shows sinus rhythm, normal QTc  I ordered medication including IV fluid for hypotension, IV and oral K for hypokalemia,  IV mag for hypomagnesemia  I have reviewed the patients home medicines and have made adjustments as needed. Patient advised to STOP lasix   (taking intermittently for LE edema, but may be worsening hypoK).  Advised to start oral K 20 meq BID and needs close outpatient f/u and recheck if she refuses medical admission.  Test Considered: CT considered for vomiting/poor oral intake as noted in ED course, but patient refused  After the interventions noted above, I reevaluated the patient and found that they have: improved  - BP improved   Disposition:  As the patient refused medical admission, she will be discharged AGAINST MEDICAL ADVICE after completing her fluids and potassium magnesium .  I did prescribe the supplements for her to use at home.  I did request a repeat BMP to be sent after finishing her electrolytes to trend the change for future provider/PCP, although I do not suspect a significant normalization in her electrolytes with this treatment in the ED.         Final Clinical Impression(s) / ED Diagnoses Final diagnoses:  Hypokalemia  Hypomagnesemia  Hypotension, unspecified hypotension type    Rx / DC Orders ED Discharge Orders          Ordered    potassium chloride  SA (KLOR-CON  M) 20 MEQ tablet  2 times daily        04/27/24 1524    magnesium  oxide (MAG-OX) 400 MG tablet  Daily        04/27/24 1524              Arvilla Birmingham, MD 04/27/24 1555

## 2024-05-04 ENCOUNTER — Emergency Department (HOSPITAL_COMMUNITY)

## 2024-05-04 ENCOUNTER — Other Ambulatory Visit: Payer: Self-pay

## 2024-05-04 ENCOUNTER — Encounter (HOSPITAL_COMMUNITY): Payer: Self-pay | Admitting: *Deleted

## 2024-05-04 ENCOUNTER — Emergency Department (HOSPITAL_COMMUNITY)
Admission: EM | Admit: 2024-05-04 | Discharge: 2024-05-04 | Discharge status: Against Medical Advice | Attending: Emergency Medicine | Admitting: Emergency Medicine

## 2024-05-04 DIAGNOSIS — R600 Localized edema: Secondary | ICD-10-CM | POA: Diagnosis not present

## 2024-05-04 DIAGNOSIS — Z7901 Long term (current) use of anticoagulants: Secondary | ICD-10-CM | POA: Insufficient documentation

## 2024-05-04 DIAGNOSIS — E876 Hypokalemia: Secondary | ICD-10-CM | POA: Diagnosis not present

## 2024-05-04 DIAGNOSIS — R82998 Other abnormal findings in urine: Secondary | ICD-10-CM | POA: Diagnosis not present

## 2024-05-04 DIAGNOSIS — R799 Abnormal finding of blood chemistry, unspecified: Secondary | ICD-10-CM | POA: Diagnosis present

## 2024-05-04 LAB — CBC WITH DIFFERENTIAL/PLATELET
Abs Immature Granulocytes: 0.02 10*3/uL (ref 0.00–0.07)
Basophils Absolute: 0.1 10*3/uL (ref 0.0–0.1)
Basophils Relative: 1 %
Eosinophils Absolute: 0.1 10*3/uL (ref 0.0–0.5)
Eosinophils Relative: 1 %
HCT: 26.8 % — ABNORMAL LOW (ref 36.0–46.0)
Hemoglobin: 9.2 g/dL — ABNORMAL LOW (ref 12.0–15.0)
Immature Granulocytes: 0 %
Lymphocytes Relative: 25 %
Lymphs Abs: 2.2 10*3/uL (ref 0.7–4.0)
MCH: 28.1 pg (ref 26.0–34.0)
MCHC: 34.3 g/dL (ref 30.0–36.0)
MCV: 82 fL (ref 80.0–100.0)
Monocytes Absolute: 0.9 10*3/uL (ref 0.1–1.0)
Monocytes Relative: 11 %
Neutro Abs: 5.5 10*3/uL (ref 1.7–7.7)
Neutrophils Relative %: 62 %
Platelets: 402 10*3/uL — ABNORMAL HIGH (ref 150–400)
RBC: 3.27 MIL/uL — ABNORMAL LOW (ref 3.87–5.11)
RDW: 15.3 % (ref 11.5–15.5)
WBC: 8.8 10*3/uL (ref 4.0–10.5)
nRBC: 0 % (ref 0.0–0.2)

## 2024-05-04 LAB — COMPREHENSIVE METABOLIC PANEL WITH GFR
ALT: 18 U/L (ref 0–44)
AST: 15 U/L (ref 15–41)
Albumin: 2.1 g/dL — ABNORMAL LOW (ref 3.5–5.0)
Alkaline Phosphatase: 43 U/L (ref 38–126)
Anion gap: 10 (ref 5–15)
BUN: 9 mg/dL (ref 8–23)
CO2: 29 mmol/L (ref 22–32)
Calcium: 6.9 mg/dL — ABNORMAL LOW (ref 8.9–10.3)
Chloride: 98 mmol/L (ref 98–111)
Creatinine, Ser: 0.92 mg/dL (ref 0.44–1.00)
GFR, Estimated: >60 mL/min (ref >60)
Glucose, Bld: 74 mg/dL (ref 70–99)
Potassium: <2 mmol/L — CL (ref 3.5–5.1)
Sodium: 137 mmol/L (ref 135–145)
Total Bilirubin: 1.1 mg/dL (ref 0.0–1.2)
Total Protein: 4.2 g/dL — ABNORMAL LOW (ref 6.5–8.1)

## 2024-05-04 LAB — URINALYSIS, W/ REFLEX TO CULTURE (INFECTION SUSPECTED)
Bilirubin Urine: NEGATIVE
Glucose, UA: NEGATIVE mg/dL
Ketones, ur: NEGATIVE mg/dL
Nitrite: NEGATIVE
Protein, ur: NEGATIVE mg/dL
Specific Gravity, Urine: 1.004 — ABNORMAL LOW (ref 1.005–1.030)
pH: 6 (ref 5.0–8.0)

## 2024-05-04 LAB — TROPONIN I (HIGH SENSITIVITY)
Troponin I (High Sensitivity): 17 ng/L (ref <18)
Troponin I (High Sensitivity): 16 ng/L (ref <18)

## 2024-05-04 LAB — MAGNESIUM: Magnesium: 1.4 mg/dL — ABNORMAL LOW (ref 1.7–2.4)

## 2024-05-04 LAB — BRAIN NATRIURETIC PEPTIDE: B Natriuretic Peptide: 228.6 pg/mL — ABNORMAL HIGH (ref 0.0–100.0)

## 2024-05-04 MED ORDER — POTASSIUM CHLORIDE 10 MEQ/100ML IV SOLN: 10.0000 meq | INTRAVENOUS | Status: DC

## 2024-05-04 MED ORDER — SODIUM CHLORIDE 0.9 % IV SOLN
1.0000 g | Freq: Once | INTRAVENOUS | Status: AC
  Administered 2024-05-04: 1 g via INTRAVENOUS
  Filled 2024-05-04: qty 10

## 2024-05-04 MED ORDER — SODIUM CHLORIDE 0.9 % IV BOLUS
500.0000 mL | Freq: Once | INTRAVENOUS | Status: AC
  Administered 2024-05-04: 500 mL via INTRAVENOUS

## 2024-05-04 NOTE — ED Provider Notes (Signed)
 [place] EMERGENCY DEPARTMENT AT [facility] Provider Note   CSN: [id number] Arrival date & time: 05/04/24  1546     History  Chief Complaint  Patient presents with   Hypotension   abnormal labs    Kathleen Miller is a 78 y.o. female.  78 year old female with prior medical history as detailed below presents for evaluation.  Patient was seen last week for similar complaint.  At that time, her potassium was undetectable.  She refused admission.  She was given IV potassium and started on p.o. potassium and p.o. magnesium .  She reports compliance with prescribed p.o. potassium and magnesium .  She reports that today in her PCPs office she was referred back to the ED for reevaluation of her potassium.  The history is provided by the patient.       Home Medications Prior to Admission medications   Medication Sig Start Date End Date Taking? Authorizing Provider  albuterol  (PROAIR  HFA) 108 (90 Base) MCG/ACT inhaler Inhale 2 puffs into the lungs every 6 (six) hours as needed for wheezing or shortness of breath. 02/13/19   Paz, Jose E, MD  apixaban  (ELIQUIS ) 5 MG TABS tablet Take 1 tablet by mouth 2 (two) times daily. 05/10/18   [provider]  furosemide  (LASIX ) 20 MG tablet Take 2 tablets (40 mg total) by mouth daily. Patient taking differently: Take 40 mg by mouth See admin instructions. Take 40 mg by mouth once weekly on Sunday as needed for fluid 07/24/20   Copland, Skipper Dumas, MD  lovastatin  (MEVACOR ) 20 MG tablet TAKE 1 TABLET BY MOUTH AT BEDTIME 05/18/21   Copland, Skipper Dumas, MD  magnesium  oxide (MAG-OX) 400 MG tablet Take 1 tablet (400 mg total) by mouth daily for 20 days. 04/27/24 05/17/24  Arvilla Birmingham, MD  meloxicam (MOBIC) 15 MG tablet Take 15 mg by mouth daily. Patient not taking: Reported on 04/27/2024    [provider]  minoxidil (LONITEN) 2.5 MG tablet Take 2.5 mg by mouth daily. Patient not taking: Reported on 04/27/2024    [provider]  nortriptyline  (PAMELOR ) 10 MG capsule Take 1 capsule (10 mg total) by mouth at bedtime. Patient not taking: Reported on 04/27/2024 03/29/24   Sater, Sherida Dimmer, MD  OZEMPIC, 1 MG/DOSE, 4 MG/3ML SOPN Inject 1 mg into the skin once a week. Patient not taking: Reported on 04/27/2024    [provider]  Potassium Chloride  ER 20 MEQ TBCR Take 1 tablet by mouth daily.    [provider]  potassium chloride  SA (KLOR-CON  M) 20 MEQ tablet Take 1 tablet (20 mEq total) by mouth 2 (two) times daily for 20 days. 04/27/24 05/17/24  Arvilla Birmingham, MD  thyroid  (NP THYROID ) 60 MG tablet Take 1 tablet (60 mg total) by mouth daily before breakfast. 07/29/20   Copland, Jessica C, MD  traMADol  (ULTRAM ) 50 MG tablet TAKE 1 TABLET BY MOUTH EVERY 6 HOURS AS NEEDED FOR MODERATE PAIN Patient taking differently: Take 50 mg by mouth as needed for moderate pain (pain score 4-6) or severe pain (pain score 7-10). 09/19/20   Copland, Jessica C, MD  Vitamin D, Ergocalciferol, (DRISDOL) 1.25 MG (50000 UNIT) CAPS capsule Take 50,000 Units by mouth every 7 (seven) days.    [provider]      Allergies    Nsaids and Tolmetin    Review of Systems   Review of Systems  All other systems reviewed and are negative.   Physical Exam Updated  Vital Signs BP (!) 107/42 (BP Location: Right Arm)   Pulse 65   Temp 98.5 F (36.9 C) (Oral)   Resp 20   Ht 5\' 4"  (1.626 m)   Wt 75.8 kg   SpO2 100%   BMI 28.68 kg/m  Physical Exam Vitals and nursing note reviewed.  Constitutional:      General: She is not in acute distress.    Appearance: Normal appearance. She is well-developed.  HENT:     Head: Normocephalic and atraumatic.  Eyes:     Conjunctiva/sclera: Conjunctivae normal.     Pupils: Pupils are equal, round, and reactive to light.  Cardiovascular:     Rate and Rhythm: Normal rate and regular rhythm.     Heart sounds: Normal heart sounds.  Pulmonary:     Effort: Pulmonary effort is  normal. No respiratory distress.     Breath sounds: Normal breath sounds.  Abdominal:     General: There is no distension.     Palpations: Abdomen is soft.     Tenderness: There is no abdominal tenderness.  Musculoskeletal:        General: No deformity. Normal range of motion.     Cervical back: Normal range of motion and neck supple.     Right lower leg: Edema present.     Left lower leg: Edema present.  Skin:    General: Skin is warm and dry.  Neurological:     General: No focal deficit present.     Mental Status: She is alert and oriented to person, place, and time.     ED Results / Procedures / Treatments   Labs (all labs ordered are listed, but only abnormal results are displayed) Labs Reviewed  CBC WITH DIFFERENTIAL/PLATELET  COMPREHENSIVE METABOLIC PANEL WITH GFR  URINALYSIS, W/ REFLEX TO CULTURE (INFECTION SUSPECTED)  MAGNESIUM   BRAIN NATRIURETIC PEPTIDE  TROPONIN I (HIGH SENSITIVITY)    EKG None  Radiology DG Chest Port 1 View Result Date: 05/04/2024 CLINICAL DATA:  sob EXAM: PORTABLE CHEST - 1 VIEW COMPARISON:  August 27, 2023 FINDINGS: No focal airspace consolidation, pleural effusion, or pneumothorax. No cardiomegaly. No acute fracture or destructive lesion. Bilateral AC joint osteoarthritis. Multilevel thoracic osteophytosis. IMPRESSION: No acute cardiopulmonary abnormality. Electronically Signed   By: Rance Burrows M.D.   On: 05/04/2024 16:51    Procedures Procedures    Medications Ordered in ED Medications - No data to display  ED Course/ Medical Decision Making/ A&P                                 Medical Decision Making Amount and/or Complexity of Data Reviewed Labs: ordered. Radiology: ordered.  Risk Prescription drug management.    Medical Screen Complete  This patient presented to the ED with complaint of weakness, low potassium, lower extremity edema.  This complaint involves an extensive number of treatment options. The initial  differential diagnosis includes, but is not limited to, metabolic abnormality, etc.  This presentation is: Acute, Chronic, Self-Limited, Previously Undiagnosed, Uncertain Prognosis, Complicated, Systemic Symptoms, and Threat to Life/Bodily Function  Patient is presenting with complaint of suspected low potassium.  Patient was diagnosed with low potassium last week.  She was advised remain in the ED for inpatient treatment of her hypokalemia.  She left AMA.  Today she returns with again concern for low potassium.  On recheck today, her potassium is undetectable.  Additionally her magnesium  is 1.4.  She reports compliance with prescribed oral potassium and magnesium  supplementation.  Patient is unwilling to stay in the hospital today.  She is unwilling to stay in the hospital for at least a couple of runs of potassium.  She does not like hospitals.  She does not like hospital food.  She reports that she has "other things to do."  Patient is advised that her potassium is undetectably low.  The appropriate medical management would involve admission for IV replacement of her potassium.  This is not something they can be managed in the outpatient setting.  Additionally patient's urine is suggestive of possible early UTI.  This should also be treated.  She again refuses admission for this treatment.  Patient has capacity to refuse care.  Patient is advised that significant metabolic derangements such as severe hypokalemia can result in significant medical problems including death.  Patient is leaving AGAINST MEDICAL ADVICE.  Patient is advised to return to the ED if she decides that she wants to stay in the hospital.  Additional history obtained:  External records from outside sources obtained and reviewed including prior ED visits and prior Inpatient records.    Problem List / ED Course:  Hypokalemia, hypomagnesemia     Disposition:  After consideration of the diagnostic results and the  patients response to treatment, I feel that the patent would benefit from admission.   CRITICAL CARE Performed by: Burnette Carte   Total critical care time: 30 minutes  Critical care time was exclusive of separately billable procedures and treating other patients.  Critical care was necessary to treat or prevent imminent or life-threatening deterioration.  Critical care was time spent personally by me on the following activities: development of treatment plan with patient and/or surrogate as well as nursing, discussions with consultants, evaluation of patient's response to treatment, examination of patient, obtaining history from patient or surrogate, ordering and performing treatments and interventions, ordering and review of laboratory studies, ordering and review of radiographic studies, pulse oximetry and re-evaluation of patient's condition.          Final Clinical Impression(s) / ED Diagnoses Final diagnoses:  Hypokalemia    Rx / DC Orders ED Discharge Orders     None         Burnette Carte, MD 05/04/24 2055

## 2024-05-04 NOTE — ED Triage Notes (Signed)
 BIB EMS from PCP office due to hypotension and abnormal labs, low K+ reported, Man BP 104/66-64-17-96% RA, CBG 160 #18 L FA 400 ml NS infused.

## 2024-05-04 NOTE — Discharge Instructions (Signed)
 Return for any problem.  If you change your mind about seeking medical treatment, please return immediately to the ED.  Again, you should be admitted today for replacement of your potassium and correction of your low magnesium .  This is not something that can be treated in the outpatient setting successfully.  You should continue to take oral magnesium  and potassium replacement as previously prescribed.

## 2024-05-05 ENCOUNTER — Encounter (HOSPITAL_COMMUNITY): Payer: Self-pay | Admitting: *Deleted

## 2024-05-05 ENCOUNTER — Other Ambulatory Visit: Payer: Self-pay

## 2024-05-05 ENCOUNTER — Emergency Department (HOSPITAL_COMMUNITY)
Admission: EM | Admit: 2024-05-05 | Discharge: 2024-05-05 | Disposition: A | Discharge status: Home / Self Care | Attending: Emergency Medicine | Admitting: Emergency Medicine

## 2024-05-05 DIAGNOSIS — Z7901 Long term (current) use of anticoagulants: Secondary | ICD-10-CM | POA: Insufficient documentation

## 2024-05-05 DIAGNOSIS — E876 Hypokalemia: Secondary | ICD-10-CM | POA: Diagnosis present

## 2024-05-05 LAB — CBC WITH DIFFERENTIAL/PLATELET
Abs Immature Granulocytes: 0.06 10*3/uL (ref 0.00–0.07)
Basophils Absolute: 0.1 10*3/uL (ref 0.0–0.1)
Basophils Relative: 1 %
Eosinophils Absolute: 0.1 10*3/uL (ref 0.0–0.5)
Eosinophils Relative: 1 %
HCT: 26.6 % — ABNORMAL LOW (ref 36.0–46.0)
Hemoglobin: 9.1 g/dL — ABNORMAL LOW (ref 12.0–15.0)
Immature Granulocytes: 1 %
Lymphocytes Relative: 24 %
Lymphs Abs: 1.7 10*3/uL (ref 0.7–4.0)
MCH: 28.2 pg (ref 26.0–34.0)
MCHC: 34.2 g/dL (ref 30.0–36.0)
MCV: 82.4 fL (ref 80.0–100.0)
Monocytes Absolute: 0.9 10*3/uL (ref 0.1–1.0)
Monocytes Relative: 13 %
Neutro Abs: 4.5 10*3/uL (ref 1.7–7.7)
Neutrophils Relative %: 60 %
Platelets: 399 10*3/uL (ref 150–400)
RBC: 3.23 MIL/uL — ABNORMAL LOW (ref 3.87–5.11)
RDW: 15.8 % — ABNORMAL HIGH (ref 11.5–15.5)
WBC: 7.3 10*3/uL (ref 4.0–10.5)
nRBC: 0 % (ref 0.0–0.2)

## 2024-05-05 LAB — BASIC METABOLIC PANEL WITH GFR
Anion gap: 9 (ref 5–15)
Anion gap: 7 (ref 5–15)
BUN: 8 mg/dL (ref 8–23)
BUN: 7 mg/dL — ABNORMAL LOW (ref 8–23)
CO2: 33 mmol/L — ABNORMAL HIGH (ref 22–32)
CO2: 31 mmol/L (ref 22–32)
Calcium: 8 mg/dL — ABNORMAL LOW (ref 8.9–10.3)
Calcium: 7.7 mg/dL — ABNORMAL LOW (ref 8.9–10.3)
Chloride: 94 mmol/L — ABNORMAL LOW (ref 98–111)
Chloride: 98 mmol/L (ref 98–111)
Creatinine, Ser: 1.03 mg/dL — ABNORMAL HIGH (ref 0.44–1.00)
Creatinine, Ser: 0.82 mg/dL (ref 0.44–1.00)
GFR, Estimated: 57 mL/min — ABNORMAL LOW (ref >60)
GFR, Estimated: >60 mL/min (ref >60)
Glucose, Bld: 88 mg/dL (ref 70–99)
Glucose, Bld: 93 mg/dL (ref 70–99)
Potassium: 2.3 mmol/L — CL (ref 3.5–5.1)
Potassium: 2.6 mmol/L — CL (ref 3.5–5.1)
Sodium: 136 mmol/L (ref 135–145)
Sodium: 136 mmol/L (ref 135–145)

## 2024-05-05 LAB — MAGNESIUM: Magnesium: 1.6 mg/dL — ABNORMAL LOW (ref 1.7–2.4)

## 2024-05-05 MED ORDER — POTASSIUM CHLORIDE 10 MEQ/100ML IV SOLN
10.0000 meq | INTRAVENOUS | Status: AC
  Administered 2024-05-05 (×2): 10 meq via INTRAVENOUS
  Filled 2024-05-05 (×2): qty 100

## 2024-05-05 MED ORDER — SODIUM CHLORIDE 0.9 % IV BOLUS
1000.0000 mL | Freq: Once | INTRAVENOUS | Status: AC
  Administered 2024-05-05: 1000 mL via INTRAVENOUS

## 2024-05-05 MED ORDER — SODIUM CHLORIDE 0.9 % IV SOLN: INTRAVENOUS | Status: DC

## 2024-05-05 MED ORDER — MAGNESIUM SULFATE 2 GM/50ML IV SOLN
2.0000 g | Freq: Once | INTRAVENOUS | Status: AC
  Administered 2024-05-05: 2 g via INTRAVENOUS
  Filled 2024-05-05: qty 50

## 2024-05-05 MED ORDER — POTASSIUM CHLORIDE CRYS ER 20 MEQ PO TBCR
60.0000 meq | EXTENDED_RELEASE_TABLET | Freq: Once | ORAL | Status: AC
  Administered 2024-05-05: 60 meq via ORAL
  Filled 2024-05-05: qty 3

## 2024-05-05 NOTE — ED Triage Notes (Addendum)
 Pt returns to get treatment for Potassium, here yesterday and left with  a K+ < 2.0. Remains shob and lower extremities swelling more than yesterday. Also is having difficulty controlling urine

## 2024-05-05 NOTE — ED Notes (Signed)
 Patient ambulated to bathroom with walker.

## 2024-05-05 NOTE — ED Notes (Addendum)
 Patient assisted to bathroom by wheelchair.

## 2024-05-05 NOTE — ED Provider Notes (Signed)
 [facility] AT [facility] Provider Note   CSN: [id number] Arrival date & time: 05/05/24  9629     History  Chief Complaint  Patient presents with   Shortness of Breath    Kathleen Miller is a 78 y.o. female.  78 year old female presents with low potassium.  Patient is here yesterday for same.  Potassium was undetectable.  Patient refused hospitalization and she signed out AMA.  Patient chronically takes oral potassium as well as oral magnesium .  States that she does not know what the cause of this.  Has never been seen by nephrology.  Denies any emesis currently.  Has had diarrhea but states that this started few days ago.  Patient still refusing to be admitted but wants to receive some potassium here.  Review of patient's old records show that she is on Lasix        Home Medications Prior to Admission medications   Medication Sig Start Date End Date Taking? Authorizing Provider  albuterol  (PROAIR  HFA) 108 (90 Base) MCG/ACT inhaler Inhale 2 puffs into the lungs every 6 (six) hours as needed for wheezing or shortness of breath. 02/13/19   Paz, Jose E, MD  apixaban  (ELIQUIS ) 5 MG TABS tablet Take 1 tablet by mouth 2 (two) times daily. 05/10/18   [provider]  furosemide  (LASIX ) 20 MG tablet Take 2 tablets (40 mg total) by mouth daily. Patient taking differently: Take 40 mg by mouth See admin instructions. Take 40 mg by mouth once weekly on Sunday as needed for fluid 07/24/20   Copland, Skipper Dumas, MD  lovastatin  (MEVACOR ) 20 MG tablet TAKE 1 TABLET BY MOUTH AT BEDTIME 05/18/21   Copland, Skipper Dumas, MD  magnesium  oxide (MAG-OX) 400 MG tablet Take 1 tablet (400 mg total) by mouth daily for 20 days. 04/27/24 05/17/24  Arvilla Birmingham, MD  meloxicam (MOBIC) 15 MG tablet Take 15 mg by mouth daily. Patient not taking: Reported on 04/27/2024    [provider]  minoxidil (LONITEN) 2.5 MG tablet Take 2.5 mg by mouth daily. Patient not taking:  Reported on 04/27/2024    [provider]  nortriptyline  (PAMELOR ) 10 MG capsule Take 1 capsule (10 mg total) by mouth at bedtime. Patient not taking: Reported on 04/27/2024 03/29/24   Sater, Sherida Dimmer, MD  OZEMPIC, 1 MG/DOSE, 4 MG/3ML SOPN Inject 1 mg into the skin once a week. Patient not taking: Reported on 04/27/2024    [provider]  Potassium Chloride  ER 20 MEQ TBCR Take 1 tablet by mouth daily.    [provider]  potassium chloride  SA (KLOR-CON  M) 20 MEQ tablet Take 1 tablet (20 mEq total) by mouth 2 (two) times daily for 20 days. 04/27/24 05/17/24  Arvilla Birmingham, MD  thyroid  (NP THYROID ) 60 MG tablet Take 1 tablet (60 mg total) by mouth daily before breakfast. 07/29/20   Copland, Jessica C, MD  traMADol  (ULTRAM ) 50 MG tablet TAKE 1 TABLET BY MOUTH EVERY 6 HOURS AS NEEDED FOR MODERATE PAIN Patient taking differently: Take 50 mg by mouth as needed for moderate pain (pain score 4-6) or severe pain (pain score 7-10). 09/19/20   Copland, Jessica C, MD  Vitamin D, Ergocalciferol, (DRISDOL) 1.25 MG (50000 UNIT) CAPS capsule Take 50,000 Units by mouth every 7 (seven) days.    [provider]      Allergies    Nsaids and Tolmetin    Review of Systems   Review of Systems  All  other systems reviewed and are negative.   Physical Exam Updated Vital Signs BP (!) 101/53 (BP Location: Left Arm)   Pulse 72   Temp 98 F (36.7 C) (Oral)   Resp 11   Ht 1.626 m (5\' 4" )   SpO2 100%   BMI 28.68 kg/m  Physical Exam Vitals and nursing note reviewed.  Constitutional:      General: She is not in acute distress.    Appearance: Normal appearance. She is well-developed. She is not toxic-appearing.  HENT:     Head: Normocephalic and atraumatic.  Eyes:     General: Lids are normal.     Conjunctiva/sclera: Conjunctivae normal.     Pupils: Pupils are equal, round, and reactive to light.  Neck:     Thyroid : No thyroid  mass.     Trachea: No tracheal deviation.   Cardiovascular:     Rate and Rhythm: Normal rate and regular rhythm.     Heart sounds: Normal heart sounds. No murmur heard.    No gallop.  Pulmonary:     Effort: Pulmonary effort is normal. No respiratory distress.     Breath sounds: Normal breath sounds. No stridor. No decreased breath sounds, wheezing, rhonchi or rales.  Abdominal:     General: There is no distension.     Palpations: Abdomen is soft.     Tenderness: There is no abdominal tenderness. There is no rebound.  Musculoskeletal:        General: No tenderness. Normal range of motion.     Cervical back: Normal range of motion and neck supple.  Skin:    General: Skin is warm and dry.     Findings: No abrasion or rash.  Neurological:     Mental Status: She is alert and oriented to person, place, and time. Mental status is at baseline.     GCS: GCS eye subscore is 4. GCS verbal subscore is 5. GCS motor subscore is 6.     Cranial Nerves: No cranial nerve deficit.     Sensory: No sensory deficit.     Motor: Motor function is intact.  Psychiatric:        Attention and Perception: Attention normal.        Speech: Speech normal.        Behavior: Behavior normal.     ED Results / Procedures / Treatments   Labs (all labs ordered are listed, but only abnormal results are displayed) Labs Reviewed  CBC WITH DIFFERENTIAL/PLATELET  BASIC METABOLIC PANEL WITH GFR  MAGNESIUM     EKG EKG Interpretation Date/Time:  Saturday May 05 2024 09:04:23 EDT Ventricular Rate:  65 PR Interval:  166 QRS Duration:  82 QT Interval:  441 QTC Calculation: 459 R Axis:   -22  Text Interpretation: Sinus rhythm Borderline left axis deviation Low voltage, precordial leads Consider anterior infarct Minimal ST depression, lateral leads No significant change since last tracing Confirmed by Lind Repine ([id number]) on 05/05/2024 9:19:11 AM  Radiology DG Chest Port 1 View Result Date: 05/04/2024 CLINICAL DATA:  sob EXAM: PORTABLE CHEST - 1 VIEW  COMPARISON:  August 27, 2023 FINDINGS: No focal airspace consolidation, pleural effusion, or pneumothorax. No cardiomegaly. No acute fracture or destructive lesion. Bilateral AC joint osteoarthritis. Multilevel thoracic osteophytosis. IMPRESSION: No acute cardiopulmonary abnormality. Electronically Signed   By: Rance Burrows M.D.   On: 05/04/2024 16:51    Procedures Procedures    Medications Ordered in ED Medications  sodium chloride  0.9 % bolus 1,000 mL (has no  administration in time range)  0.9 %  sodium chloride  infusion (has no administration in time range)    ED Course/ Medical Decision Making/ A&P                                 Medical Decision Making Amount and/or Complexity of Data Reviewed Labs: ordered.  Risk Prescription drug management.  Patient's EKG shows no changes of acute hypokalemia. Patient given IV fluids and does feel better.  Potassium was low at 2.3.  Given 20 mill equivalents IV and 60 mill equivalents p.o.  Repeat be met shows potassium not 2.6.  Patient once again offered admission which she has deferred.  Patient to receive an additional 20 mill equivalents potassium IV and will be discharged.  I strongly encouraged her to ask her doctor about seeing a nephrologist.  She is agreeable  CRITICAL CARE Performed by: Eden Goodpasture Total critical care time: 45 minutes Critical care time was exclusive of separately billable procedures and treating other patients. Critical care was necessary to treat or prevent imminent or life-threatening deterioration. Critical care was time spent personally by me on the following activities: development of treatment plan with patient and/or surrogate as well as nursing, discussions with consultants, evaluation of patient's response to treatment, examination of patient, obtaining history from patient or surrogate, ordering and performing treatments and interventions, ordering and review of laboratory studies, ordering and  review of radiographic studies, pulse oximetry and re-evaluation of patient's condition.         Final Clinical Impression(s) / ED Diagnoses Final diagnoses:  None    Rx / DC Orders ED Discharge Orders     None         Lind Repine, MD 05/05/24 1554

## 2024-05-05 NOTE — Discharge Instructions (Signed)
 Are offered inpatient admission which you have deferred.  Risk explained to you and you accept these. Follow-up with your primary care doctor for further management of your hypokalemia

## 2024-05-05 NOTE — ED Notes (Signed)
 Patient went to bathroom with assistance of wheelchair

## 2024-05-05 NOTE — ED Provider Notes (Signed)
 Patient signed out to me by previous provider. Please refer to their note for full HPI.  Briefly this is a 78 year old female who presented with low potassium.  Has been seen before, admission recommended but patient left AGAINST MEDICAL ADVICE.  She returns today with ongoing symptoms and requesting potassium replacement.  Repeat evaluation here shows continued hypokalemia and hypomagnesia.  Replacement ordered by previous provider.  Patient again refuses to stay or be admitted.  Patient counseled thoroughly by previous provider.  Plan will be for replacement of potassium and discharged for outpatient follow-up.  Patient reiterates her understanding to me of risks associated with declining admission.     Flonnie Humphrey, DO 05/05/24 4540

## 2024-05-07 LAB — URINE CULTURE: Culture: >=100000 — AB

## 2024-05-08 ENCOUNTER — Telehealth (HOSPITAL_BASED_OUTPATIENT_CLINIC_OR_DEPARTMENT_OTHER): Payer: Self-pay

## 2024-05-08 NOTE — Progress Notes (Signed)
 ED Antimicrobial Stewardship Positive Culture Follow Up   Kathleen Miller is an 78 y.o. female who presented to [facility] on 05/04/2024 with a chief complaint of  Chief Complaint  Patient presents with   Hypotension   abnormal labs    Recent Results (from the past 720 hours)  Urine Culture     Status: Abnormal   Collection Time: 05/04/24  5:49 PM   Specimen: Urine, Random  Result Value Ref Range Status   Specimen Description   Final    URINE, RANDOM Performed at [facility], [address] [place], [place] [address]    Special Requests   Final    NONE Reflexed from [id number] Performed at [facility], [address] [place], [place] [address]    Culture (A)  Final    >=100,000 COLONIES/mL KLEBSIELLA PNEUMONIAE 50,000 COLONIES/mL PROTEUS MIRABILIS    Report Status 05/07/2024 FINAL  Final   Organism ID, Bacteria KLEBSIELLA PNEUMONIAE (A)  Final   Organism ID, Bacteria PROTEUS MIRABILIS (A)  Final      Susceptibility   Klebsiella pneumoniae - MIC*    AMPICILLIN >=32 RESISTANT Resistant     CEFAZOLIN <=4 SENSITIVE Sensitive     CEFEPIME <=0.12 SENSITIVE Sensitive     CEFTRIAXONE  <=0.25 SENSITIVE Sensitive     CIPROFLOXACIN <=0.25 SENSITIVE Sensitive     GENTAMICIN <=1 SENSITIVE Sensitive     IMIPENEM <=0.25 SENSITIVE Sensitive     NITROFURANTOIN 64 INTERMEDIATE Intermediate     TRIMETH/SULFA <=20 SENSITIVE Sensitive     AMPICILLIN/SULBACTAM 4 SENSITIVE Sensitive     PIP/TAZO <=4 SENSITIVE Sensitive ug/mL    * >=100,000 COLONIES/mL KLEBSIELLA PNEUMONIAE   Proteus mirabilis - MIC*    AMPICILLIN <=2 SENSITIVE Sensitive     CEFAZOLIN <=4 SENSITIVE Sensitive     CEFEPIME <=0.12 SENSITIVE Sensitive     CEFTRIAXONE  <=0.25 SENSITIVE Sensitive     CIPROFLOXACIN <=0.25 SENSITIVE Sensitive     GENTAMICIN <=1 SENSITIVE Sensitive     IMIPENEM 2 SENSITIVE Sensitive     NITROFURANTOIN 128 RESISTANT Resistant     TRIMETH/SULFA <=20 SENSITIVE  Sensitive     AMPICILLIN/SULBACTAM <=2 SENSITIVE Sensitive     PIP/TAZO <=4 SENSITIVE Sensitive ug/mL    * 50,000 COLONIES/mL PROTEUS MIRABILIS   Pt presented with hypotension, hypokalemia, and difficultly controlling urine- likely due to Lasix  home med. UA unremarkable. Afebrile. WBC WNL. Likely asymptomatic bacteruria, no antibiotic treatment necessary.    ED Provider: Dixon Fredrickson, PA-C   Jadi Deyarmin, PY4 PharmD Candidate 05/08/2024, 9:02 AM Monday - Friday phone -  [phone] Saturday - Sunday phone - [phone]

## 2024-05-08 NOTE — Telephone Encounter (Signed)
 Post ED Visit - Positive Culture Follow-up  Culture report reviewed by antimicrobial stewardship pharmacist: [facility] []  Court Distance, Pharm.D. []  Skeet Duke, Pharm.D., BCPS AQ-ID []  Leslee Rase, Pharm.D., BCPS []  Garland Junk, [address].D., BCPS []  [place], [address].D., BCPS, AAHIVP []  Alcide Aly, Pharm.D., BCPS, AAHIVP []  Jerri Morale, PharmD, BCPS []  Graham Laws, PharmD, BCPS []  Cleda Curly, PharmD, BCPS []  Tamar Fairly, PharmD []  Ballard Levels, PharmD, BCPS []  Ollen Beverage, PharmD  [facility] []  Arlyne Bering, PharmD []  Sherryle Don, PharmD []  Van Gelinas, PharmD []  Delila Felty, Rph []  [place]) Cleora Daft, PharmD []  Augustina Block, PharmD []  Arie Kurtz, PharmD []  Sharlyn Deaner, PharmD []  Agnes Hose, PharmD []  Kendall Pauls, PharmD []  Gladstone Lamer, PharmD []  Armanda Bern, PharmD []  Tera Fellows, PharmD X   Denson Flake, PharmD  Positive urine culture No treatment Chart reviewed by Dixon Fredrickson PA-C Plan Asymptomatic bacteruria - No treatment  Kathleen Miller 05/08/2024, 9:34 AM

## 2024-05-15 NOTE — Patient Instructions (Addendum)
 SURGICAL WAITING ROOM VISITATION  Patients having surgery or a procedure may have no more than 2 support people in the waiting area - these visitors may rotate.    Children under the age of 42 must have an adult with them who is not the patient.  Visitors with respiratory illnesses are discouraged from visiting and should remain at home.  If the patient needs to stay at the hospital during part of their recovery, the visitor guidelines for inpatient rooms apply. Pre-op nurse will coordinate an appropriate time for 1 support person to accompany patient in pre-op.  This support person may not rotate.    Please refer to the [facility] website for the visitor guidelines for Inpatients (after your surgery is over and you are in a regular room).    Your procedure is scheduled on: 05/21/24   Report to [facility] Main Entrance    Report to admitting at 9:55 AM   Call this number if you have problems the morning of surgery [phone]   Do not eat food :After Midnight.   After Midnight you may have the following liquids until 9:24 AM DAY OF SURGERY  Water Non-Citrus Juices (without pulp, NO RED-Apple, White grape, White cranberry) Black Coffee (NO MILK/CREAM OR CREAMERS, sugar ok)  Clear Tea (NO MILK/CREAM OR CREAMERS, sugar ok) regular and decaf                             Plain Jell-O (NO RED)                                           Fruit ices (not with fruit pulp, NO RED)                                     Popsicles (NO RED)                                                               Sports drinks like Gatorade (NO RED)    The day of surgery:  Drink ONE (1) Pre-Surgery G2 at 9:25  AM the morning of surgery. Drink in one sitting. Do not sip.  This drink was given to you during your hospital  pre-op appointment visit. Nothing else to drink after completing the  Pre-Surgery G2.          If you have questions, please contact your surgeon's office.   FOLLOW BOWEL PREP  AND ANY ADDITIONAL PRE OP INSTRUCTIONS YOU RECEIVED FROM YOUR SURGEON'S OFFICE!!!     Oral Hygiene is also important to reduce your risk of infection.                                    Remember - BRUSH YOUR TEETH THE MORNING OF SURGERY WITH YOUR REGULAR TOOTHPASTE  DENTURES WILL BE REMOVED PRIOR TO SURGERY PLEASE DO NOT APPLY Poly grip OR ADHESIVES!!!   Stop all vitamins and herbal supplements 7 days before surgery.  Take these medicines the morning of surgery with A SIP OF WATER: Albuterol , Thyroid , Tramadol               You may not have any metal on your body including hair pins, jewelry, and body piercing             Do not wear make-up, lotions, powders, perfumes, or deodorant  Do not wear nail polish including gel and S&S, artificial/acrylic nails, or any other type of covering on natural nails including finger and toenails. If you have artificial nails, gel coating, etc. that needs to be removed by a nail salon please have this removed prior to surgery or surgery may need to be canceled/ delayed if the surgeon/ anesthesia feels like they are unable to be safely monitored.   Do not shave  48 hours prior to surgery.    Do not bring valuables to the hospital. [facility] IS NOT             RESPONSIBLE   FOR VALUABLES.   Contacts, glasses, dentures or bridgework may not be worn into surgery.   Bring small overnight bag day of surgery.   DO NOT BRING YOUR HOME MEDICATIONS TO THE HOSPITAL. PHARMACY WILL DISPENSE MEDICATIONS LISTED ON YOUR MEDICATION LIST TO YOU DURING YOUR ADMISSION IN THE HOSPITAL!              Please read over the following fact sheets you were given: IF YOU HAVE QUESTIONS ABOUT YOUR PRE-OP INSTRUCTIONS PLEASE CALL [name]    If you received a COVID test during your pre-op visit  it is requested that you wear a mask when out in public, stay away from anyone that may not be feeling well and notify your surgeon if you develop symptoms. If you test  positive for Covid or have been in contact with anyone that has tested positive in the last 10 days please notify you surgeon.      Pre-operative 5 CHG Bath Instructions   You can play a key role in reducing the risk of infection after surgery. Your skin needs to be as free of germs as possible. You can reduce the number of germs on your skin by washing with CHG (chlorhexidine  gluconate) soap before surgery. CHG is an antiseptic soap that kills germs and continues to kill germs even after washing.   DO NOT use if you have an allergy to chlorhexidine /CHG or antibacterial soaps. If your skin becomes reddened or irritated, stop using the CHG and notify one of our RNs at [phone].   Please shower with the CHG soap starting 4 days before surgery using the following schedule:     Please keep in mind the following:  DO NOT shave, including legs and underarms, starting the day of your first shower.   You may shave your face at any point before/day of surgery.  Place clean sheets on your bed the day you start using CHG soap. Use a clean washcloth (not used since being washed) for each shower. DO NOT sleep with pets once you start using the CHG.   CHG Shower Instructions:  If you choose to wash your hair and private area, wash first with your normal shampoo/soap.  After you use shampoo/soap, rinse your hair and body thoroughly to remove shampoo/soap residue.  Turn the water OFF and apply about 3 tablespoons (45 ml) of CHG soap to a CLEAN washcloth.  Apply CHG soap ONLY FROM YOUR NECK DOWN TO YOUR TOES (washing  for 3-5 minutes)  DO NOT use CHG soap on face, private areas, open wounds, or sores.  Pay special attention to the area where your surgery is being performed.  If you are having back surgery, having someone wash your back for you may be helpful. Wait 2 minutes after CHG soap is applied, then you may rinse off the CHG soap.  Pat dry with a clean towel  Put on clean clothes/pajamas   If  you choose to wear lotion, please use ONLY the CHG-compatible lotions on the back of this paper.     Additional instructions for the day of surgery: DO NOT APPLY any lotions, deodorants, cologne, or perfumes.   Put on clean/comfortable clothes.  Brush your teeth.  Ask your nurse before applying any prescription medications to the skin.      CHG Compatible Lotions   Aveeno Moisturizing lotion  Cetaphil Moisturizing Cream  Cetaphil Moisturizing Lotion  Clairol Herbal Essence Moisturizing Lotion, Dry Skin  Clairol Herbal Essence Moisturizing Lotion, Extra Dry Skin  Clairol Herbal Essence Moisturizing Lotion, Normal Skin  Curel Age Defying Therapeutic Moisturizing Lotion with Alpha Hydroxy  Curel Extreme Care Body Lotion  Curel Soothing Hands Moisturizing Hand Lotion  Curel Therapeutic Moisturizing Cream, Fragrance-Free  Curel Therapeutic Moisturizing Lotion, Fragrance-Free  Curel Therapeutic Moisturizing Lotion, Original Formula  Eucerin Daily Replenishing Lotion  Eucerin Dry Skin Therapy Plus Alpha Hydroxy Crme  Eucerin Dry Skin Therapy Plus Alpha Hydroxy Lotion  Eucerin Original Crme  Eucerin Original Lotion  Eucerin Plus Crme Eucerin Plus Lotion  Eucerin TriLipid Replenishing Lotion  Keri Anti-Bacterial Hand Lotion  Keri Deep Conditioning Original Lotion Dry Skin Formula Softly Scented  Keri Deep Conditioning Original Lotion, Fragrance Free Sensitive Skin Formula  Keri Lotion Fast Absorbing Fragrance Free Sensitive Skin Formula  Keri Lotion Fast Absorbing Softly Scented Dry Skin Formula  Keri Original Lotion  Keri Skin Renewal Lotion Keri Silky Smooth Lotion  Keri Silky Smooth Sensitive Skin Lotion  Nivea Body Creamy Conditioning Oil  Nivea Body Extra Enriched Lotion  Nivea Body Original Lotion  Nivea Body Sheer Moisturizing Lotion Nivea Crme  Nivea Skin Firming Lotion  NutraDerm 30 Skin Lotion  NutraDerm Skin Lotion  NutraDerm Therapeutic Skin Cream  NutraDerm  Therapeutic Skin Lotion  ProShield Protective Hand Cream  Provon moisturizing lotion   Incentive Spirometer  An incentive spirometer is a tool that can help keep your lungs clear and active. This tool measures how well you are filling your lungs with each breath. Taking long deep breaths may help reverse or decrease the chance of developing breathing (pulmonary) problems (especially infection) following: A long period of time when you are unable to move or be active. BEFORE THE PROCEDURE  If the spirometer includes an indicator to show your best effort, your nurse or respiratory therapist will set it to a desired goal. If possible, sit up straight or lean slightly forward. Try not to slouch. Hold the incentive spirometer in an upright position. INSTRUCTIONS FOR USE  Sit on the edge of your bed if possible, or sit up as far as you can in bed or on a chair. Hold the incentive spirometer in an upright position. Breathe out normally. Place the mouthpiece in your mouth and seal your lips tightly around it. Breathe in slowly and as deeply as possible, raising the piston or the ball toward the top of the column. Hold your breath for 3-5 seconds or for as long as possible. Allow the piston or ball to fall to  the bottom of the column. Remove the mouthpiece from your mouth and breathe out normally. Rest for a few seconds and repeat Steps 1 through 7 at least 10 times every 1-2 hours when you are awake. Take your time and take a few normal breaths between deep breaths. The spirometer may include an indicator to show your best effort. Use the indicator as a goal to work toward during each repetition. After each set of 10 deep breaths, practice coughing to be sure your lungs are clear. If you have an incision (the cut made at the time of surgery), support your incision when coughing by placing a pillow or rolled up towels firmly against it. Once you are able to get out of bed, walk around indoors and cough  well. You may stop using the incentive spirometer when instructed by your caregiver.  RISKS AND COMPLICATIONS Take your time so you do not get dizzy or light-headed. If you are in pain, you may need to take or ask for pain medication before doing incentive spirometry. It is harder to take a deep breath if you are having pain. AFTER USE Rest and breathe slowly and easily. It can be helpful to keep track of a log of your progress. Your caregiver can provide you with a simple table to help with this. If you are using the spirometer at home, follow these instructions: SEEK MEDICAL CARE IF:  You are having difficultly using the spirometer. You have trouble using the spirometer as often as instructed. Your pain medication is not giving enough relief while using the spirometer. You develop fever of 100.5 F (38.1 C) or higher. SEEK IMMEDIATE MEDICAL CARE IF:  You cough up bloody sputum that had not been present before. You develop fever of 102 F (38.9 C) or greater. You develop worsening pain at or near the incision site. MAKE SURE YOU:  Understand these instructions. Will watch your condition. Will get help right away if you are not doing well or get worse. Document Released: 03/28/2007 Document Revised: 02/07/2012 Document Reviewed: 05/29/2007 [facility] Patient Information 2014 [place], [place].   ________________________________________________________________________

## 2024-05-15 NOTE — Progress Notes (Addendum)
 COVID Vaccine Completed:yes  Date of COVID positive in last 90 days:  PCP - Zack Hero, MD Cardiologist - Dr. Stann Earnest in 2018 for SOB and CP  Chest x-ray - CT 08/31/23 [facility] EKG - 05/07/24 Epic Stress Test - 07/21/17 Epic ECHO - 12/19/17 Epic Cardiac Cath - 08/05/17 Epic Pacemaker/ICD device last checked: n/a Spinal Cord Stimulator:n/a  Bowel Prep - no  Sleep Study - n/a CPAP -   Fasting Blood Sugar - preDM, no meds or checks at home Checks Blood Sugar _____ times a day  Last dose of GLP1 agonist-  N/A GLP1 instructions:  Hold 7 days before surgery    Last dose of SGLT-2 inhibitors-  N/A SGLT-2 instructions:  Hold 3 days before surgery    Blood Thinner Instructions:  Eliquis , hold 3 days Aspirin  Instructions: Last Dose: 05/17/24   Activity level: Can perform activities of daily living without stopping and without symptoms of chest pain or shortness of breath. Ambulates with Rolator. No stairs due to clots  Anesthesia review: CHF, HTN, PE, DM2, anemia, Hgb 9.3  Patient denies shortness of breath, fever, cough and chest pain at PAT appointment  Patient verbalized understanding of instructions that were given to them at the PAT appointment. Patient was also instructed that they will need to review over the PAT instructions again at home before surgery.

## 2024-05-16 ENCOUNTER — Encounter (HOSPITAL_COMMUNITY): Payer: Self-pay

## 2024-05-16 ENCOUNTER — Encounter (HOSPITAL_COMMUNITY)
Admission: RE | Admit: 2024-05-16 | Discharge: 2024-05-16 | Disposition: A | Discharge status: Home / Self Care | Payer: Worker's Compensation | Source: Ambulatory Visit | Attending: Orthopedic Surgery | Admitting: Orthopedic Surgery

## 2024-05-16 ENCOUNTER — Other Ambulatory Visit: Payer: Self-pay

## 2024-05-16 VITALS — BP 95/55 | HR 74 | Temp 98.2°F | Resp 12 | Ht 64.0 in

## 2024-05-16 DIAGNOSIS — E119 Type 2 diabetes mellitus without complications: Secondary | ICD-10-CM | POA: Diagnosis not present

## 2024-05-16 DIAGNOSIS — Z01812 Encounter for preprocedural laboratory examination: Secondary | ICD-10-CM | POA: Insufficient documentation

## 2024-05-16 DIAGNOSIS — Z01818 Encounter for other preprocedural examination: Secondary | ICD-10-CM

## 2024-05-16 HISTORY — DX: Other pulmonary embolism without acute cor pulmonale: I26.99

## 2024-05-16 LAB — BASIC METABOLIC PANEL WITH GFR
Anion gap: 8 (ref 5–15)
BUN: 14 mg/dL (ref 8–23)
CO2: 23 mmol/L (ref 22–32)
Calcium: 8.9 mg/dL (ref 8.9–10.3)
Chloride: 102 mmol/L (ref 98–111)
Creatinine, Ser: 1.16 mg/dL — ABNORMAL HIGH (ref 0.44–1.00)
GFR, Estimated: 49 mL/min — ABNORMAL LOW (ref >60)
Glucose, Bld: 94 mg/dL (ref 70–99)
Potassium: 3.5 mmol/L (ref 3.5–5.1)
Sodium: 133 mmol/L — ABNORMAL LOW (ref 135–145)

## 2024-05-16 LAB — CBC
HCT: 28.8 % — ABNORMAL LOW (ref 36.0–46.0)
Hemoglobin: 9.3 g/dL — ABNORMAL LOW (ref 12.0–15.0)
MCH: 28.5 pg (ref 26.0–34.0)
MCHC: 32.3 g/dL (ref 30.0–36.0)
MCV: 88.3 fL (ref 80.0–100.0)
Platelets: 487 10*3/uL — ABNORMAL HIGH (ref 150–400)
RBC: 3.26 MIL/uL — ABNORMAL LOW (ref 3.87–5.11)
RDW: 17.2 % — ABNORMAL HIGH (ref 11.5–15.5)
WBC: 8.2 10*3/uL (ref 4.0–10.5)
nRBC: 0 % (ref 0.0–0.2)

## 2024-05-16 LAB — SURGICAL PCR SCREEN
MRSA, PCR: NEGATIVE
Staphylococcus aureus: NEGATIVE

## 2024-05-17 ENCOUNTER — Encounter (HOSPITAL_COMMUNITY): Payer: Self-pay | Admitting: Medical

## 2024-05-17 NOTE — Progress Notes (Signed)
 Hgb 9.3, results routed to Dr. Murrell Arrant

## 2024-05-21 ENCOUNTER — Ambulatory Visit (HOSPITAL_COMMUNITY)
Admission: RE | Admit: 2024-05-21 | Payer: Worker's Compensation | Source: Home / Self Care | Admitting: Orthopedic Surgery

## 2024-05-21 ENCOUNTER — Encounter (HOSPITAL_COMMUNITY): Admission: RE | Payer: Self-pay | Source: Home / Self Care

## 2024-05-21 SURGERY — ARTHROPLASTY, KNEE, TOTAL
Anesthesia: Spinal | Site: Knee | Laterality: Right

## 2024-05-23 NOTE — Telephone Encounter (Signed)
 Received fax message from [facility]: Thank you for your referral. We wanted to alert you that we have attempted to contact Kathleen Miller (DOB: 10-09-1946) omn 5 separate occasions to initiate therapy services, however, to, date we have bee unable to schedule the patient. If you have any questions, please feel free to call our office at [phone].  Contacted patient, no answer left voicemail notifying [facility] has been trying to contact you to schedule appointment. Left contact info for [facility] to schedule appointment

## 2024-07-05 ENCOUNTER — Telehealth: Payer: Self-pay | Admitting: *Deleted

## 2024-07-05 NOTE — Telephone Encounter (Signed)
 Receive fax that previous form expired. Faxed below form/notes to [facility], received fax confirmation.

## 2024-09-27 NOTE — Progress Notes (Signed)
 Sent message, via epic in basket, requesting orders in epic from Careers adviser.

## 2024-10-02 ENCOUNTER — Other Ambulatory Visit: Payer: Self-pay | Admitting: Orthopedic Surgery

## 2024-10-02 NOTE — Patient Instructions (Signed)
 SURGICAL WAITING ROOM VISITATION  Patients having surgery or a procedure may have no more than 2 support people in the waiting area - these visitors may rotate.    Children under the age of 32 must have an adult with them who is not the patient.  Visitors with respiratory illnesses are discouraged from visiting and should remain at home.  If the patient needs to stay at the hospital during part of their recovery, the visitor guidelines for inpatient rooms apply. Pre-op nurse will coordinate an appropriate time for 1 support person to accompany patient in pre-op.  This support person may not rotate.    Please refer to the [facility] website for the visitor guidelines for Inpatients (after your surgery is over and you are in a regular room).       Your procedure is scheduled on:  10/08/2024    Report to [facility] Main Entrance    Report to admitting at  915 AM   Call this number if you have problems the morning of surgery [phone]   Do not eat food :After Midnight.   After Midnight you may have the following liquids until __ 0845____ AM/ PM DAY OF SURGERY  Water Non-Citrus Juices (without pulp, NO RED-Apple, White grape, White cranberry) Black Coffee (NO MILK/CREAM OR CREAMERS, sugar ok)  Clear Tea (NO MILK/CREAM OR CREAMERS, sugar ok) regular and decaf                             Plain Jell-O (NO RED)                                           Fruit ices (not with fruit pulp, NO RED)                                     Popsicles (NO RED)                                                               Sports drinks like Gatorade (NO RED)                    The day of surgery:  Drink ONE (1) Pre-Surgery Clear Ensure or G2 at 0845 AM the morning of surgery. Drink in one sitting. Do not sip.  This drink was given to you during your hospital  pre-op appointment visit. Nothing else to drink after completing the  Pre-Surgery Clear Ensure or G2.          If you have  questions, please contact your surgeon's office.       Oral Hygiene is also important to reduce your risk of infection.                                    Remember - BRUSH YOUR TEETH THE MORNING OF SURGERY WITH YOUR REGULAR TOOTHPASTE  DENTURES WILL BE REMOVED PRIOR TO SURGERY PLEASE DO NOT APPLY Poly grip OR  ADHESIVES!!!   Do NOT smoke after Midnight   Stop all vitamins and herbal supplements 7 days before surgery.   Take these medicines the morning of surgery with A SIP OF WATER: inhalers as usual and bring, , NP Thyroid    DO NOT TAKE ANY ORAL DIABETIC MEDICATIONS DAY OF YOUR SURGERY  Bring CPAP mask and tubing day of surgery.                              You may not have any metal on your body including hair pins, jewelry, and body piercing             Do not wear make-up, lotions, powders, perfumes/cologne, or deodorant  Do not wear nail polish including gel and S&S, artificial/acrylic nails, or any other type of covering on natural nails including finger and toenails. If you have artificial nails, gel coating, etc. that needs to be removed by a nail salon please have this removed prior to surgery or surgery may need to be canceled/ delayed if the surgeon/ anesthesia feels like they are unable to be safely monitored.   Do not shave  48 hours prior to surgery.               Men may shave face and neck.   Do not bring valuables to the hospital. [facility] IS NOT             RESPONSIBLE   FOR VALUABLES.   Contacts, glasses, dentures or bridgework may not be worn into surgery.   Bring small overnight bag day of surgery.   DO NOT BRING YOUR HOME MEDICATIONS TO THE HOSPITAL. PHARMACY WILL DISPENSE MEDICATIONS LISTED ON YOUR MEDICATION LIST TO YOU DURING YOUR ADMISSION IN THE HOSPITAL!    Patients discharged on the day of surgery will not be allowed to drive home.  Someone NEEDS to stay with you for the first 24 hours after anesthesia.   Special Instructions: Bring a copy of  your healthcare power of attorney and living will documents the day of surgery if you haven't scanned them before.              Please read over the following fact sheets you were given: IF YOU HAVE QUESTIONS ABOUT YOUR PRE-OP INSTRUCTIONS PLEASE CALL [phone].   If you received a COVID test during your pre-op visit  it is requested that you wear a mask when out in public, stay away from anyone that may not be feeling well and notify your surgeon if you develop symptoms. If you test positive for Covid or have been in contact with anyone that has tested positive in the last 10 days please notify you surgeon.      Pre-operative 4 CHG Bath Instructions   You can play a key role in reducing the risk of infection after surgery. Your skin needs to be as free of germs as possible. You can reduce the number of germs on your skin by washing with CHG (chlorhexidine  gluconate) soap before surgery. CHG is an antiseptic soap that kills germs and continues to kill germs even after washing.   DO NOT use if you have an allergy to chlorhexidine /CHG or antibacterial soaps. If your skin becomes reddened or irritated, stop using the CHG and notify one of our RNs at [phone].   Please shower with the CHG soap starting 4 days before surgery using the following schedule:  Please keep in mind the following:  DO NOT shave, including legs and underarms, starting the day of your first shower.   You may shave your face at any point before/day of surgery.  Place clean sheets on your bed the day you start using CHG soap. Use a clean washcloth (not used since being washed) for each shower. DO NOT sleep with pets once you start using the CHG.   CHG Shower Instructions:  If you choose to wash your hair and private area, wash first with your normal shampoo/soap.  After you use shampoo/soap, rinse your hair and body thoroughly to remove shampoo/soap residue.  Turn the water OFF and apply about 3 tablespoons (45 ml)  of CHG soap to a CLEAN washcloth.  Apply CHG soap ONLY FROM YOUR NECK DOWN TO YOUR TOES (washing for 3-5 minutes)  DO NOT use CHG soap on face, private areas, open wounds, or sores.  Pay special attention to the area where your surgery is being performed.  If you are having back surgery, having someone wash your back for you may be helpful. Wait 2 minutes after CHG soap is applied, then you may rinse off the CHG soap.  Pat dry with a clean towel  Put on clean clothes/pajamas   If you choose to wear lotion, please use ONLY the CHG-compatible lotions on the back of this paper.     Additional instructions for the day of surgery: DO NOT APPLY any lotions, deodorants, cologne, or perfumes.   Put on clean/comfortable clothes.  Brush your teeth.  Ask your nurse before applying any prescription medications to the skin.      CHG Compatible Lotions   Aveeno Moisturizing lotion  Cetaphil Moisturizing Cream  Cetaphil Moisturizing Lotion  Clairol Herbal Essence Moisturizing Lotion, Dry Skin  Clairol Herbal Essence Moisturizing Lotion, Extra Dry Skin  Clairol Herbal Essence Moisturizing Lotion, Normal Skin  Curel Age Defying Therapeutic Moisturizing Lotion with Alpha Hydroxy  Curel Extreme Care Body Lotion  Curel Soothing Hands Moisturizing Hand Lotion  Curel Therapeutic Moisturizing Cream, Fragrance-Free  Curel Therapeutic Moisturizing Lotion, Fragrance-Free  Curel Therapeutic Moisturizing Lotion, Original Formula  Eucerin Daily Replenishing Lotion  Eucerin Dry Skin Therapy Plus Alpha Hydroxy Crme  Eucerin Dry Skin Therapy Plus Alpha Hydroxy Lotion  Eucerin Original Crme  Eucerin Original Lotion  Eucerin Plus Crme Eucerin Plus Lotion  Eucerin TriLipid Replenishing Lotion  Keri Anti-Bacterial Hand Lotion  Keri Deep Conditioning Original Lotion Dry Skin Formula Softly Scented  Keri Deep Conditioning Original Lotion, Fragrance Free Sensitive Skin Formula  Keri Lotion Fast Absorbing  Fragrance Free Sensitive Skin Formula  Keri Lotion Fast Absorbing Softly Scented Dry Skin Formula  Keri Original Lotion  Keri Skin Renewal Lotion Keri Silky Smooth Lotion  Keri Silky Smooth Sensitive Skin Lotion  Nivea Body Creamy Conditioning Oil  Nivea Body Extra Enriched Teacher, Adult Education Moisturizing Lotion Nivea Crme  Nivea Skin Firming Lotion  NutraDerm 30 Skin Lotion  NutraDerm Skin Lotion  NutraDerm Therapeutic Skin Cream  NutraDerm Therapeutic Skin Lotion  ProShield Protective Hand Cream  Provon moisturizing lotion

## 2024-10-02 NOTE — Progress Notes (Signed)
 Second request for pre op orders spoke with Dagoberto Kipper.

## 2024-10-04 ENCOUNTER — Encounter (HOSPITAL_COMMUNITY)
Admission: RE | Admit: 2024-10-04 | Discharge: 2024-10-04 | Disposition: A | Discharge status: Home / Self Care | Payer: Worker's Compensation | Source: Ambulatory Visit | Attending: Orthopedic Surgery | Admitting: Orthopedic Surgery

## 2024-10-04 ENCOUNTER — Encounter (HOSPITAL_COMMUNITY): Payer: Self-pay

## 2024-10-04 ENCOUNTER — Other Ambulatory Visit: Payer: Self-pay

## 2024-10-04 VITALS — BP 111/76 | HR 68 | Temp 98.5°F | Resp 16 | Ht 63.0 in

## 2024-10-04 DIAGNOSIS — I10 Essential (primary) hypertension: Secondary | ICD-10-CM | POA: Insufficient documentation

## 2024-10-04 DIAGNOSIS — Z7901 Long term (current) use of anticoagulants: Secondary | ICD-10-CM | POA: Insufficient documentation

## 2024-10-04 DIAGNOSIS — Z86711 Personal history of pulmonary embolism: Secondary | ICD-10-CM | POA: Insufficient documentation

## 2024-10-04 DIAGNOSIS — M1711 Unilateral primary osteoarthritis, right knee: Secondary | ICD-10-CM | POA: Insufficient documentation

## 2024-10-04 DIAGNOSIS — Z01818 Encounter for other preprocedural examination: Secondary | ICD-10-CM

## 2024-10-04 DIAGNOSIS — E039 Hypothyroidism, unspecified: Secondary | ICD-10-CM | POA: Diagnosis not present

## 2024-10-04 DIAGNOSIS — Z01812 Encounter for preprocedural laboratory examination: Secondary | ICD-10-CM | POA: Insufficient documentation

## 2024-10-04 HISTORY — DX: Unspecified osteoarthritis, unspecified site: M19.90

## 2024-10-04 HISTORY — DX: Dyspnea, unspecified: R06.00

## 2024-10-04 HISTORY — DX: Anemia, unspecified: D64.9

## 2024-10-04 LAB — CBC
HCT: 35.9 % — ABNORMAL LOW (ref 36.0–46.0)
Hemoglobin: 11.3 g/dL — ABNORMAL LOW (ref 12.0–15.0)
MCH: 27.2 pg (ref 26.0–34.0)
MCHC: 31.5 g/dL (ref 30.0–36.0)
MCV: 86.3 fL (ref 80.0–100.0)
Platelets: 446 K/uL — ABNORMAL HIGH (ref 150–400)
RBC: 4.16 MIL/uL (ref 3.87–5.11)
RDW: 13.7 % (ref 11.5–15.5)
WBC: 9.1 K/uL (ref 4.0–10.5)
nRBC: 0 % (ref 0.0–0.2)

## 2024-10-04 LAB — BASIC METABOLIC PANEL WITH GFR
Anion gap: 11 (ref 5–15)
BUN: 14 mg/dL (ref 8–23)
CO2: 17 mmol/L — ABNORMAL LOW (ref 22–32)
Calcium: 9 mg/dL (ref 8.9–10.3)
Chloride: 110 mmol/L (ref 98–111)
Creatinine, Ser: 0.95 mg/dL (ref 0.44–1.00)
GFR, Estimated: >60 mL/min (ref >60)
Glucose, Bld: 88 mg/dL (ref 70–99)
Potassium: 4 mmol/L (ref 3.5–5.1)
Sodium: 138 mmol/L (ref 135–145)

## 2024-10-04 LAB — SURGICAL PCR SCREEN
MRSA, PCR: NEGATIVE
Staphylococcus aureus: NEGATIVE

## 2024-10-04 NOTE — Progress Notes (Addendum)
 Anesthesia Review:  ERE:Ijcpi Bouska.   LOV 09/17/24.   Cardiologist : Uvaldo  LOV 08/28/24  09/06/24- Brendon monoghan - preop exam  Neuro- DR Vear- Clearance 07/05/24 in Media Tab   PPM/ ICD: Device Orders: Rep Notified:  Chest x-ray : EKG :05/07/24  Echo : 08/22/24  Stress test: 08/28/24  Cardiac Cath :   Activity level: cannot do a flight of stairs without difficulty  Sleep Study/ CPAP :  none  Fasting Blood Sugar :      / Checks Blood Sugar -- times a day:     PreDiabetes  Blood Thinner/ Instructions /Last Dose: ASA / Instructions/ Last Dose :    Eliquis - PT states at preop she has not received any preop instructions.  Instructed pt to call Dr Yvone office in regards to this. And written on preop instructons.   Preop nurse also called office and LVMm for Judy, surgery Scheduler that pt has not yet received preop instructions and no preop orders in epic.     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 Clemens in parking lot at work on 03/03/2023 per pt.     Preop on 10/03/2024 Called pt at 1102am pt answered and stated she was in parking lot with walker  walking in.  I am so made I cannot see straight.  I am walking with my walker in parking lot   Went to pt in parking lot  with wheelchair and lady was helping her in her car. PT was in car by the time preop nurse got to car.   ASked pt did she fall and pt stated   PT states I did not fall   this lady helped me.   Tera Glatter was witness.  Lady driver drove her to front and preop nurse and Tera Glatter assisted her into wheelchair.  Preop appt completed.  Shuttle service was called and pt assisted into shuttle with walker x 2 personnel and pt driven to car where she was placed in car by 3 employees.  Of JosiaH Glatter, Jennifer Zina and Arnulfo Ellen.  PT was in car with shuttle service observing.  PT stated she was not maDe aware of shutttle service nor valet parking  at time preop appt was made.  PT has phone number for shuttle service  upon departure and is aware of valet parking for DOS.  Charge nurse aware.     No orders  at preop have been requested x 2.  Requested for 3rd time  .   PT was 30 minutes late for preop appt after ordeal with getting pt into the building.   Med hx and preop instructons completed and labs completed.  PT unsure fo when she takes meds at preop appt and will call her after 3pm on 11/6/to review what meds ot take am of surgery when she is at home.  PT aware preop nurse is to call her.     10/04/2024  1540pm.  Attempted to call pt at [phone].  Unalbe to leave  message. Mailbox full. PT was unsure at preop when she took meds. Without looking at bottles pt states she does not know when she takes meds withoutt looking at bottles.  PT was aware preop nurse would be calling her after 3pm on 10/04/2024 to review meds.  1630pm- Attempted agin to reach pt .  Mailboxd full.  Called and LVMM for brother, Nadara Quale and asked him on message to call his sister and have her call  me at [phone].  PT has not called back by time preop nurse shift was over.  Left chart on charge nurse desk for followup.  Called Vertell Jes , Director and made her aware of above and that [facility] had been completed.

## 2024-10-05 NOTE — Progress Notes (Signed)
 Anesthesia Chart Review   Case: [id number] Date/Time: 10/08/24 1135   Procedure: ARTHROPLASTY, KNEE, TOTAL (Right: Knee)   Anesthesia type: Spinal   Pre-op diagnosis: RIGHT KNEE OSTEOARTHRITIS   Location: [facility] 06 / [facility]   Surgeons: Kathleen Rush, MD       DISCUSSION:78 y.o. never smoker with h/o HTN, hypothyroidism, PE on Eliquis , right knee OA scheduled for above procedure 10/08/2024 with Dr. Rush Kathleen.   Pt seen by cardiology 08/16/2024 for preoperative evaluation. Stress test ordered.   Stress test 08/28/2024 negative for ischemia.   Echo 08/22/2024 (Care Everywhere) Left Ventricle: Left ventricle size is normal. Systolic function is low  normal. EF: 50-55%. Doppler parameters consistent with mild diastolic  dysfunction and low to normal LA pressure.    Right Ventricle: Right ventricle size is normal. Systolic function is  normal.   Tricuspid Valve: The right ventricular systolic pressure is normal (<36  mmHg).   Left Atrium: Left atrium is mildly dilated at 4.500 cm.   Unable to reach patient to discuss Eliquis  instructions.  Left a voicemail with Dr. Yvone office.  VS: BP 111/76   Pulse 68   Temp 36.9 C (Oral)   Resp 16   Ht 5' 3 (1.6 m)   SpO2 99%   BMI 29.60 kg/m   PROVIDERS: Pura Lenis, MD is PCP   Uvaldo Fusi, MD is Cardiologist  LABS: Labs reviewed: Acceptable for surgery. (all labs ordered are listed, but only abnormal results are displayed)  Labs Reviewed  CBC - Abnormal; Notable for the following components:      Result Value   Hemoglobin 11.3 (*)    HCT 35.9 (*)    Platelets 446 (*)    All other components within normal limits  BASIC METABOLIC PANEL WITH GFR - Abnormal; Notable for the following components:   CO2 17 (*)    All other components within normal limits  SURGICAL PCR SCREEN     IMAGES:   EKG:   CV: Echo 12/19/2017 - Left ventricle: The cavity size was normal. Systolic function was    normal. The estimated ejection  fraction was in the range of 60%    to 65%. Wall motion was normal; there were no regional wall    motion abnormalities. Doppler parameters are consistent with    abnormal left ventricular relaxation (grade 1 diastolic    dysfunction).  - Mitral valve: Calcified annulus.  - Left atrium: The atrium was moderately dilated.  - Right ventricle: The cavity size was moderately dilated. Wall    thickness was normal.  - Right atrium: The atrium was mildly dilated.   Cardiac Cath 08/05/2017 The left ventricular systolic function is normal. LV end diastolic pressure is normal. The left ventricular ejection fraction is 55-65% by visual estimate.   1. Normal coronary anatomy 2. Normal LV function 3. Normal LV EDP    Past Medical History:  Diagnosis Date   Anemia    Arthritis    Chronic back pain    Dyspnea    Hypertension    Hypothyroidism    Pre-diabetes    takes metformin  preventatively   Pulmonary embolism (HCC)    Ventral hernia     Past Surgical History:  Procedure Laterality Date   ABDOMINAL HYSTERECTOMY  11/2011   CHOLECYSTECTOMY  1988   COLONOSCOPY WITH PROPOFOL  N/A 05/27/2016   Procedure: COLONOSCOPY WITH PROPOFOL ;  Surgeon: Jerrell Sol, MD;  Location: [facility];  Service: Endoscopy;  Laterality: N/A;   DILATION AND  CURETTAGE OF UTERUS     x2   ESOPHAGOGASTRODUODENOSCOPY (EGD) WITH PROPOFOL  N/A 05/27/2016   Procedure: ESOPHAGOGASTRODUODENOSCOPY (EGD) WITH PROPOFOL ;  Surgeon: Jerrell Sol, MD;  Location: [facility];  Service: Endoscopy;  Laterality: N/A;   GASTRIC BYPASS     GASTRIC ROUX-EN-Y N/A 12/13/2017   Procedure: LAPAROSCOPIC ASSTED VENTRAL HERNIA REPAIR, Upper Endo;  Surgeon: Mikell Katz, MD;  Location: [facility];  Service: General;  Laterality: N/A;  With MESH   LEFT HEART CATH AND CORONARY ANGIOGRAPHY N/A 08/05/2017   Procedure: LEFT HEART CATH AND CORONARY ANGIOGRAPHY;  Surgeon: Jordan, Peter M, MD;  [facility] INVASIVE CV LAB;  Service:  Cardiovascular;  Laterality: N/A;   revision gastric bypass     and ventrel hernia repair  Dr. Mikell 12-13-17   SPLENECTOMY, TOTAL  1988    MEDICATIONS:  acetaZOLAMIDE (DIAMOX) 250 MG tablet   albuterol  (PROAIR  HFA) 108 (90 Base) MCG/ACT inhaler   apixaban  (ELIQUIS ) 5 MG TABS tablet   ferrous sulfate 325 (65 FE) MG EC tablet   furosemide  (LASIX ) 20 MG tablet   lovastatin  (MEVACOR ) 20 MG tablet   minoxidil (LONITEN) 2.5 MG tablet   nortriptyline  (PAMELOR ) 10 MG capsule   Potassium Chloride  ER 20 MEQ TBCR   RESTASIS MULTIDOSE 0.05 % ophthalmic emulsion   thyroid  (NP THYROID ) 60 MG tablet   traMADol  (ULTRAM ) 50 MG tablet   Vitamin D, Ergocalciferol, (DRISDOL) 1.25 MG (50000 UNIT) CAPS capsule   No current facility-administered medications for this encounter.     Harlene Hoots Ward, PA-C [facility] [phone]

## 2024-10-05 NOTE — Progress Notes (Signed)
 Attempted to obtain correct medication list via telephone, unable to reach at this time. Ms. Wardrop VM at [phone] is full. I called her emergency contact, her brother Nadara Quale in [place].; was able to speak to him. He says that the patient lost her phone when she was here for her PST. Evidently, a person found it and is returning it to her asap. Mr. Quale said that he would get the patient to call our [facility] and verify her medications when he saw her later today.

## 2024-10-05 NOTE — Anesthesia Preprocedure Evaluation (Addendum)
 Anesthesia Evaluation  Patient identified by MRN, date of birth, ID band Patient awake    Reviewed: NPO status , Patient's Chart, lab work & pertinent test results  History of Anesthesia Complications Negative for: history of anesthetic complications  Airway Mallampati: II  TM Distance: >3 FB Neck ROM: Full    Dental  (+) Edentulous Upper, Edentulous Lower   Pulmonary PE (on Eliquis )   breath sounds clear to auscultation       Cardiovascular hypertension, Pt. on medications (-) angina  Rhythm:Regular Rate:Normal  Stress test 08/28/2024 negative for ischemia.    Echo 08/22/2024 (Care Everywhere) Left Ventricle: Left ventricle size is normal. Systolic function is low  normal. EF: 50-55%. Doppler parameters consistent with mild diastolic  dysfunction and low to normal LA pressure.    Right Ventricle: Right ventricle size is normal. Systolic function is  normal.   Tricuspid Valve: The right ventricular systolic pressure is normal (<36  mmHg).   Left Atrium: Left atrium is mildly dilated at 4.500 cm.       Neuro/Psych    GI/Hepatic ,GERD  ,,  Endo/Other  diabetes, Type 2Hypothyroidism    Renal/GU Lasix -induced Hypokalemia     Musculoskeletal  (+) Arthritis , Osteoarthritis,    Abdominal   Peds  Hematology  (+) Blood dyscrasia, anemia   Anesthesia Other Findings   Reproductive/Obstetrics                              Anesthesia Physical Anesthesia Plan  ASA: 3  Anesthesia Plan: Spinal and MAC   Post-op Pain Management:    Induction: Intravenous  PONV Risk Score and Plan: 2 and Ondansetron , Treatment may vary due to age or medical condition, Dexamethasone  and Propofol  infusion  Airway Management Planned: Natural Airway and Nasal Cannula  Additional Equipment:   Intra-op Plan:   Post-operative Plan:   Informed Consent:      Dental advisory given  Plan Discussed with:  CRNA  Anesthesia Plan Comments: (See PAT note 10/04/2024.   On initial evaluation, )         Anesthesia Quick Evaluation

## 2024-10-07 NOTE — H&P (Signed)
 TOTAL KNEE ADMISSION H&P  Patient is being admitted for right total knee arthroplasty.  Subjective:  Chief Complaint:right knee pain.  HPI: Kathleen Miller, 78 y.o. female, has a history of pain and functional disability in the right knee due to arthritis and has failed non-surgical conservative treatments for greater than 12 weeks to includeNSAID's and/or analgesics, corticosteriod injections, flexibility and strengthening excercises, weight reduction as appropriate, and activity modification.  Onset of symptoms was gradual, starting 3 years ago with gradually worsening course since that time. The patient noted no past surgery on the right knee(s).  Patient currently rates pain in the right knee(s) at 7 out of 10 with activity. Patient has night pain, worsening of pain with activity and weight bearing, pain that interferes with activities of daily living, pain with passive range of motion, crepitus, and joint swelling.  Patient has evidence of subchondral sclerosis, periarticular osteophytes, and joint space narrowing by imaging studies.  There is no active infection.  Patient Active Problem List   Diagnosis Date Noted   Hypothyroidism 05/20/2020   History of Roux-en-Y gastric bypass 01/27/2020   Microcytic anemia 10/11/2019   Pressure injury of skin 12/20/2017   Pulmonary embolism (HCC) 12/17/2017   ARF (acute renal failure) 12/17/2017   Incisional hernia 12/13/2017   Chest pain 08/05/2017   Abnormal nuclear stress test 08/05/2017   Low bone mass 02/23/2017   Chronic pain syndrome 07/18/2016   Dyslipidemia 07/12/2016   GERD (gastroesophageal reflux disease) 05/27/2016   Slow transit constipation 06/06/2014   Chronic combined systolic and diastolic heart failure (HCC) 12/04/2013   Essential hypertension, benign 12/04/2013   Spinal stenosis of lumbar region 12/04/2013   Morbid obesity (HCC) 08/04/2013   Acute combined systolic and diastolic congestive heart failure (HCC) 08/02/2013    Type II or unspecified type diabetes mellitus without mention of complication, uncontrolled 08/02/2013   Past Medical History:  Diagnosis Date   Anemia    Arthritis    Chronic back pain    Dyspnea    Hypertension    Hypothyroidism    Pre-diabetes    takes metformin  preventatively   Pulmonary embolism (HCC)    Ventral hernia     Past Surgical History:  Procedure Laterality Date   ABDOMINAL HYSTERECTOMY  11/2011   CHOLECYSTECTOMY  1988   COLONOSCOPY WITH PROPOFOL  N/A 05/27/2016   Procedure: COLONOSCOPY WITH PROPOFOL ;  Surgeon: Jerrell Sol, MD;  Location: [facility];  Service: Endoscopy;  Laterality: N/A;   DILATION AND CURETTAGE OF UTERUS     x2   ESOPHAGOGASTRODUODENOSCOPY (EGD) WITH PROPOFOL  N/A 05/27/2016   Procedure: ESOPHAGOGASTRODUODENOSCOPY (EGD) WITH PROPOFOL ;  Surgeon: Jerrell Sol, MD;  Location: [facility];  Service: Endoscopy;  Laterality: N/A;   GASTRIC BYPASS     GASTRIC ROUX-EN-Y N/A 12/13/2017   Procedure: LAPAROSCOPIC ASSTED VENTRAL HERNIA REPAIR, Upper Endo;  Surgeon: Mikell Katz, MD;  Location: [facility];  Service: General;  Laterality: N/A;  With MESH   LEFT HEART CATH AND CORONARY ANGIOGRAPHY N/A 08/05/2017   Procedure: LEFT HEART CATH AND CORONARY ANGIOGRAPHY;  Surgeon: Jordan, Peter M, MD;  Location: [facility];  Service: Cardiovascular;  Laterality: N/A;   revision gastric bypass     and ventrel hernia repair  Dr. Mikell 12-13-17   SPLENECTOMY, TOTAL  1988    No current facility-administered medications for this encounter.   Current Outpatient Medications  Medication Sig Dispense Refill Last Dose/Taking   acetaZOLAMIDE (DIAMOX) 250 MG tablet Take 250 mg by mouth daily.  Taking   albuterol  (PROAIR  HFA) 108 (90 Base) MCG/ACT inhaler Inhale 2 puffs into the lungs every 6 (six) hours as needed for wheezing or shortness of breath. 18 g 5 Taking As Needed   apixaban  (ELIQUIS ) 5 MG TABS tablet Take 5 mg by mouth 2 (two) times daily.   Taking    ferrous sulfate 325 (65 FE) MG EC tablet Take 325 mg by mouth daily with breakfast.   Taking   furosemide  (LASIX ) 20 MG tablet Take 2 tablets (40 mg total) by mouth daily. (Patient taking differently: Take 40 mg by mouth daily as needed for fluid.) 180 tablet 3 Taking Differently   lovastatin  (MEVACOR ) 20 MG tablet TAKE 1 TABLET BY MOUTH AT BEDTIME 90 tablet 0 Taking   minoxidil (LONITEN) 2.5 MG tablet Take 2.5 mg by mouth daily.   Taking   nortriptyline  (PAMELOR ) 10 MG capsule Take 10 mg by mouth at bedtime.   Taking   Potassium Chloride  ER 20 MEQ TBCR Take 40 mEq by mouth in the morning and at bedtime.   Taking   RESTASIS MULTIDOSE 0.05 % ophthalmic emulsion Place 1 drop into both eyes 2 (two) times daily as needed (dry eyes).   Taking As Needed   thyroid  (NP THYROID ) 60 MG tablet Take 1 tablet (60 mg total) by mouth daily before breakfast. 30 tablet 5 Taking   Vitamin D, Ergocalciferol, (DRISDOL) 1.25 MG (50000 UNIT) CAPS capsule Take 50,000 Units by mouth every Friday.   Taking   traMADol  (ULTRAM ) 50 MG tablet TAKE 1 TABLET BY MOUTH EVERY 6 HOURS AS NEEDED FOR MODERATE PAIN (Patient not taking: Reported on 10/03/2024) 30 tablet 1 Not Taking   Allergies  Allergen Reactions   Nsaids Swelling and Other (See Comments)    Severe swelling--water retention (including water on brain)   Tolmetin Swelling and Other (See Comments)    Severe swelling-water retention (including water on brain)     Social History   Tobacco Use   Smoking status: Never   Smokeless tobacco: Never  Substance Use Topics   Alcohol  use: No    Family History  Problem Relation Age of Onset   Alzheimer's disease Neg Hx    Dementia Neg Hx    Migraines Neg Hx      Review of SystemsROS: I have reviewed the patient's review of systems thoroughly and there are no positive responses as relates to the HPI.  Objective:  Physical ExamWell-developed well-nourished patient in no acute distress. Alert and oriented  x3 HEENT:within normal limits Cardiac: Regular rate and rhythm Pulmonary: Lungs clear to auscultation Abdomen: Soft and nontender.  Normal active bowel sounds  Musculoskeletal: Right knee exam: She has crepitation through range of motion.  No ligamentous instability.  Moderate effusion.  Ambulates with an antalgic gait secondary to right knee pain.  Pain through range of motion.Vital signs in last 24 hours:    Labs:   Estimated body mass index is 29.6 kg/m as calculated from the following:   Height as of 10/04/24: 5' 3 (1.6 m).   Weight as of 05/04/24: 75.8 kg.   Imaging Review Plain radiographs demonstrate severe degenerative joint disease of the right knee(s). The overall alignment isneutral. The bone quality appears to be good for age and reported activity level.      Assessment/Plan:  End stage arthritis, right knee   The patient history, physical examination, clinical judgment of the provider and imaging studies are consistent with end stage degenerative joint disease of the right  knee(s) and total knee arthroplasty is deemed medically necessary. The treatment options including medical management, injection therapy arthroscopy and arthroplasty were discussed at length. The risks and benefits of total knee arthroplasty were presented and reviewed. The risks due to aseptic loosening, infection, stiffness, patella tracking problems, thromboembolic complications and other imponderables were discussed. The patient acknowledged the explanation, agreed to proceed with the plan and consent was signed. Patient is being admitted for inpatient treatment for surgery, pain control, PT, OT, prophylactic antibiotics, VTE prophylaxis, progressive ambulation and ADL's and discharge planning. The patient is planning to be discharged home with home health services     Patient's anticipated LOS is less than 2 midnights, meeting these requirements:  - Lives within 1 hour of care - Has a competent  adult at home to recover with post-op recover - NO history of  - Chronic pain requiring opiods  - Diabetes  - Coronary Artery Disease  - Heart failure  - Heart attack  - Stroke  - DVT/VTE  - Cardiac arrhythmia  - Respiratory Failure/COPD  - Renal failure  - Anemia  - Advanced Liver disease

## 2024-10-08 ENCOUNTER — Ambulatory Visit (HOSPITAL_COMMUNITY): Payer: Worker's Compensation | Admitting: Physician Assistant

## 2024-10-08 ENCOUNTER — Encounter (HOSPITAL_COMMUNITY)
Admission: RE | Disposition: A | Discharge status: Home / Self Care | Payer: Self-pay | Source: Home / Self Care | Attending: Orthopedic Surgery

## 2024-10-08 ENCOUNTER — Ambulatory Visit (HOSPITAL_COMMUNITY)
Admission: RE | Admit: 2024-10-08 | Discharge: 2024-10-08 | Disposition: A | Discharge status: Home / Self Care | Payer: Worker's Compensation | Attending: Orthopedic Surgery | Admitting: Orthopedic Surgery

## 2024-10-08 ENCOUNTER — Encounter (HOSPITAL_COMMUNITY): Payer: Self-pay | Admitting: Orthopedic Surgery

## 2024-10-08 ENCOUNTER — Ambulatory Visit (HOSPITAL_BASED_OUTPATIENT_CLINIC_OR_DEPARTMENT_OTHER): Payer: Worker's Compensation

## 2024-10-08 DIAGNOSIS — Z01818 Encounter for other preprocedural examination: Secondary | ICD-10-CM

## 2024-10-08 DIAGNOSIS — E039 Hypothyroidism, unspecified: Secondary | ICD-10-CM

## 2024-10-08 DIAGNOSIS — I110 Hypertensive heart disease with heart failure: Secondary | ICD-10-CM | POA: Diagnosis not present

## 2024-10-08 DIAGNOSIS — T501X5A Adverse effect of loop [high-ceiling] diuretics, initial encounter: Secondary | ICD-10-CM | POA: Insufficient documentation

## 2024-10-08 DIAGNOSIS — Z86711 Personal history of pulmonary embolism: Secondary | ICD-10-CM | POA: Insufficient documentation

## 2024-10-08 DIAGNOSIS — E876 Hypokalemia: Secondary | ICD-10-CM | POA: Insufficient documentation

## 2024-10-08 DIAGNOSIS — E119 Type 2 diabetes mellitus without complications: Secondary | ICD-10-CM

## 2024-10-08 DIAGNOSIS — D649 Anemia, unspecified: Secondary | ICD-10-CM | POA: Diagnosis not present

## 2024-10-08 DIAGNOSIS — D759 Disease of blood and blood-forming organs, unspecified: Secondary | ICD-10-CM | POA: Insufficient documentation

## 2024-10-08 DIAGNOSIS — M1711 Unilateral primary osteoarthritis, right knee: Secondary | ICD-10-CM | POA: Diagnosis present

## 2024-10-08 DIAGNOSIS — Z79899 Other long term (current) drug therapy: Secondary | ICD-10-CM | POA: Insufficient documentation

## 2024-10-08 DIAGNOSIS — M25761 Osteophyte, right knee: Secondary | ICD-10-CM | POA: Diagnosis not present

## 2024-10-08 DIAGNOSIS — Z7901 Long term (current) use of anticoagulants: Secondary | ICD-10-CM | POA: Diagnosis not present

## 2024-10-08 DIAGNOSIS — I5042 Chronic combined systolic (congestive) and diastolic (congestive) heart failure: Secondary | ICD-10-CM | POA: Insufficient documentation

## 2024-10-08 DIAGNOSIS — I10 Essential (primary) hypertension: Secondary | ICD-10-CM

## 2024-10-08 HISTORY — PX: TOTAL KNEE ARTHROPLASTY: SHX125

## 2024-10-08 SURGERY — ARTHROPLASTY, KNEE, TOTAL
Anesthesia: Monitor Anesthesia Care | Site: Knee | Laterality: Right

## 2024-10-08 MED ORDER — ONDANSETRON HCL 4 MG/2ML IJ SOLN
INTRAMUSCULAR | Status: DC | PRN
  Administered 2024-10-08: 4 mg via INTRAVENOUS

## 2024-10-08 MED ORDER — MIDAZOLAM HCL (PF) 2 MG/2ML IJ SOLN: 2.0000 mg | Freq: Once | INTRAMUSCULAR | Status: DC

## 2024-10-08 MED ORDER — POVIDONE-IODINE 10 % EX SWAB: 2.0000 | Freq: Once | CUTANEOUS | Status: DC

## 2024-10-08 MED ORDER — TIZANIDINE HCL 2 MG PO TABS: 2.0000 mg | ORAL_TABLET | Freq: Four times a day (QID) | ORAL | 0 refills | Status: AC | PRN

## 2024-10-08 MED ORDER — ONDANSETRON HCL 4 MG/2ML IJ SOLN: 4.0000 mg | Freq: Once | INTRAMUSCULAR | Status: DC | PRN

## 2024-10-08 MED ORDER — FENTANYL CITRATE (PF) 50 MCG/ML IJ SOSY
50.0000 ug | PREFILLED_SYRINGE | Freq: Once | INTRAMUSCULAR | Status: AC
  Administered 2024-10-08: 50 ug via INTRAVENOUS
  Filled 2024-10-08: qty 2

## 2024-10-08 MED ORDER — LACTATED RINGERS IV BOLUS
500.0000 mL | Freq: Once | INTRAVENOUS | Status: AC
  Administered 2024-10-08: 500 mL via INTRAVENOUS

## 2024-10-08 MED ORDER — SODIUM CHLORIDE (PF) 0.9 % IJ SOLN
INTRAMUSCULAR | Status: DC | PRN
  Administered 2024-10-08: 100 mL

## 2024-10-08 MED ORDER — LACTATED RINGERS IV BOLUS: 250.0000 mL | Freq: Once | INTRAVENOUS | Status: DC

## 2024-10-08 MED ORDER — ACETAMINOPHEN 10 MG/ML IV SOLN: 1000.0000 mg | Freq: Once | INTRAVENOUS | Status: DC | PRN

## 2024-10-08 MED ORDER — DEXAMETHASONE SOD PHOSPHATE PF 10 MG/ML IJ SOLN
INTRAMUSCULAR | Status: DC | PRN
  Administered 2024-10-08: 10 mg via INTRAVENOUS

## 2024-10-08 MED ORDER — METHOCARBAMOL 500 MG PO TABS: 500.0000 mg | ORAL_TABLET | Freq: Four times a day (QID) | ORAL | Status: DC | PRN

## 2024-10-08 MED ORDER — TRANEXAMIC ACID-NACL 1000-0.7 MG/100ML-% IV SOLN
1000.0000 mg | INTRAVENOUS | Status: AC
  Administered 2024-10-08: 1000 mg via INTRAVENOUS
  Filled 2024-10-08: qty 100

## 2024-10-08 MED ORDER — SODIUM CHLORIDE 0.9 % IR SOLN
Status: DC | PRN
  Administered 2024-10-08: 1000 mL

## 2024-10-08 MED ORDER — CHLORHEXIDINE GLUCONATE 0.12 % MT SOLN: 15.0000 mL | Freq: Once | OROMUCOSAL | Status: DC

## 2024-10-08 MED ORDER — MIDAZOLAM HCL 2 MG/2ML IJ SOLN
INTRAMUSCULAR | Status: AC
  Filled 2024-10-08: qty 2

## 2024-10-08 MED ORDER — OXYCODONE-ACETAMINOPHEN 5-325 MG PO TABS: 1.0000 | ORAL_TABLET | ORAL | 0 refills | Status: AC | PRN

## 2024-10-08 MED ORDER — CEFAZOLIN SODIUM-DEXTROSE 2-4 GM/100ML-% IV SOLN
2.0000 g | INTRAVENOUS | Status: AC
  Administered 2024-10-08: 2 g via INTRAVENOUS
  Filled 2024-10-08: qty 100

## 2024-10-08 MED ORDER — BUPIVACAINE LIPOSOME 1.3 % IJ SUSP
INTRAMUSCULAR | Status: AC
  Filled 2024-10-08: qty 20

## 2024-10-08 MED ORDER — PROPOFOL 1000 MG/100ML IV EMUL
INTRAVENOUS | Status: AC
  Filled 2024-10-08: qty 100

## 2024-10-08 MED ORDER — BUPIVACAINE LIPOSOME 1.3 % IJ SUSP: 20.0000 mL | Freq: Once | INTRAMUSCULAR | Status: DC

## 2024-10-08 MED ORDER — OXYCODONE HCL 5 MG/5ML PO SOLN: 5.0000 mg | Freq: Once | ORAL | Status: AC | PRN

## 2024-10-08 MED ORDER — APIXABAN 2.5 MG PO TABS: 2.5000 mg | ORAL_TABLET | Freq: Two times a day (BID) | ORAL | 0 refills | Status: AC

## 2024-10-08 MED ORDER — 0.9 % SODIUM CHLORIDE (POUR BTL) OPTIME
TOPICAL | Status: DC | PRN
  Administered 2024-10-08: 1000 mL

## 2024-10-08 MED ORDER — AMISULPRIDE (ANTIEMETIC) 5 MG/2ML IV SOLN: 10.0000 mg | Freq: Once | INTRAVENOUS | Status: DC | PRN

## 2024-10-08 MED ORDER — BUPIVACAINE-EPINEPHRINE (PF) 0.5% -1:200000 IJ SOLN
INTRAMUSCULAR | Status: AC
  Filled 2024-10-08: qty 30

## 2024-10-08 MED ORDER — SODIUM CHLORIDE (PF) 0.9 % IJ SOLN
INTRAMUSCULAR | Status: AC
  Filled 2024-10-08: qty 50

## 2024-10-08 MED ORDER — TRANEXAMIC ACID-NACL 1000-0.7 MG/100ML-% IV SOLN: 1000.0000 mg | Freq: Once | INTRAVENOUS | Status: DC

## 2024-10-08 MED ORDER — FENTANYL CITRATE (PF) 50 MCG/ML IJ SOSY: 25.0000 ug | PREFILLED_SYRINGE | INTRAMUSCULAR | Status: DC | PRN

## 2024-10-08 MED ORDER — LACTATED RINGERS IV SOLN: INTRAVENOUS | Status: DC

## 2024-10-08 MED ORDER — PROPOFOL 500 MG/50ML IV EMUL
INTRAVENOUS | Status: DC | PRN
  Administered 2024-10-08: 100 ug/kg/min via INTRAVENOUS

## 2024-10-08 MED ORDER — METHOCARBAMOL 1000 MG/10ML IJ SOLN: 500.0000 mg | Freq: Four times a day (QID) | INTRAMUSCULAR | Status: DC | PRN

## 2024-10-08 MED ORDER — DOCUSATE SODIUM 100 MG PO CAPS
100.0000 mg | ORAL_CAPSULE | Freq: Two times a day (BID) | ORAL | 2 refills | Status: AC
Start: 2024-10-08 — End: 2025-10-08

## 2024-10-08 MED ORDER — OXYCODONE HCL 5 MG PO TABS
5.0000 mg | ORAL_TABLET | Freq: Once | ORAL | Status: AC | PRN
  Administered 2024-10-08: 5 mg via ORAL

## 2024-10-08 MED ORDER — OXYCODONE HCL 5 MG PO TABS
ORAL_TABLET | ORAL | Status: AC
  Filled 2024-10-08: qty 1

## 2024-10-08 MED ORDER — ORAL CARE MOUTH RINSE: 15.0000 mL | Freq: Once | OROMUCOSAL | Status: DC

## 2024-10-08 MED ORDER — MIDAZOLAM HCL (PF) 2 MG/2ML IJ SOLN
1.0000 mg | Freq: Once | INTRAMUSCULAR | Status: AC
Start: 2024-10-08 — End: 2024-10-08
  Administered 2024-10-08: 2 mg via INTRAVENOUS
  Filled 2024-10-08: qty 2

## 2024-10-08 MED ORDER — WATER FOR IRRIGATION, STERILE IR SOLN
Status: DC | PRN
  Administered 2024-10-08: 2000 mL

## 2024-10-08 MED ORDER — EPHEDRINE SULFATE (PRESSORS) 25 MG/5ML IV SOSY
PREFILLED_SYRINGE | INTRAVENOUS | Status: DC | PRN
  Administered 2024-10-08 (×2): 10 mg via INTRAVENOUS
  Administered 2024-10-08: 5 mg via INTRAVENOUS

## 2024-10-08 SURGICAL SUPPLY — 50 items
ATTUNE MED DOME PAT 38 KNEE (Knees) IMPLANT
ATTUNE PS FEM RT SZ 5 CEM KNEE (Femur) IMPLANT
ATTUNE PSRP INSR SZ5 5 KNEE (Insert) IMPLANT
BAG COUNTER SPONGE SURGICOUNT (BAG) IMPLANT
BAG ZIPLOCK 12X15 (MISCELLANEOUS) ×1 IMPLANT
BASE TIBIA ATTUNE KNEE SYS SZ6 (Knees) IMPLANT
BLADE SAGITTAL 25.0X1.19X90 (BLADE) ×1 IMPLANT
BLADE SAW SGTL 13.0X1.19X90.0M (BLADE) ×1 IMPLANT
BNDG ELASTIC 6INX 5YD STR LF (GAUZE/BANDAGES/DRESSINGS) ×1 IMPLANT
BNDG ELASTIC 6X10 VLCR STRL LF (GAUZE/BANDAGES/DRESSINGS) IMPLANT
BOOTIES KNEE HIGH SLOAN (MISCELLANEOUS) ×1 IMPLANT
BOWL SMART MIX CTS (DISPOSABLE) ×1 IMPLANT
CEMENT HV SMART SET (Cement) ×2 IMPLANT
COVER SURGICAL LIGHT HANDLE (MISCELLANEOUS) ×1 IMPLANT
CUFF TRNQT CYL 34X4.125X (TOURNIQUET CUFF) ×1 IMPLANT
DERMABOND ADVANCED .7 DNX12 (GAUZE/BANDAGES/DRESSINGS) ×1 IMPLANT
DRAPE U-SHAPE 47X51 STRL (DRAPES) ×1 IMPLANT
DRSG AQUACEL AG ADV 3.5X 4 (GAUZE/BANDAGES/DRESSINGS) IMPLANT
DRSG AQUACEL AG ADV 3.5X10 (GAUZE/BANDAGES/DRESSINGS) ×1 IMPLANT
DURAPREP 26ML APPLICATOR (WOUND CARE) ×1 IMPLANT
ELECT PENCIL ROCKER SW 15FT (MISCELLANEOUS) ×1 IMPLANT
ELECT REM PT RETURN 15FT ADLT (MISCELLANEOUS) ×1 IMPLANT
GLOVE BIO SURGEON STRL SZ8.5 (GLOVE) IMPLANT
GLOVE BIOGEL PI IND STRL 8 (GLOVE) ×2 IMPLANT
GLOVE BIOGEL PI IND STRL 9 (GLOVE) IMPLANT
GLOVE ECLIPSE 7.5 STRL STRAW (GLOVE) ×2 IMPLANT
GLOVE ECLIPSE 8.5 STRL (GLOVE) IMPLANT
GOWN STRL REUS W/ TWL XL LVL3 (GOWN DISPOSABLE) ×2 IMPLANT
HOOD PEEL AWAY T7 (MISCELLANEOUS) ×3 IMPLANT
KIT TURNOVER KIT A (KITS) ×1 IMPLANT
MANIFOLD NEPTUNE II (INSTRUMENTS) ×1 IMPLANT
NDL HYPO 22X1.5 SAFETY MO (MISCELLANEOUS) ×2 IMPLANT
NEEDLE HYPO 22X1.5 SAFETY MO (MISCELLANEOUS) ×2 IMPLANT
PACK TOTAL KNEE CUSTOM (KITS) ×1 IMPLANT
PADDING CAST COTTON 6X4 STRL (CAST SUPPLIES) ×1 IMPLANT
PIN STEINMAN FIXATION KNEE (PIN) IMPLANT
PROTECTOR NERVE ULNAR (MISCELLANEOUS) ×1 IMPLANT
SET HNDPC FAN SPRY TIP SCT (DISPOSABLE) ×1 IMPLANT
SPIKE FLUID TRANSFER (MISCELLANEOUS) ×2 IMPLANT
SUT MNCRL AB 3-0 PS2 18 (SUTURE) ×1 IMPLANT
SUT VIC AB 0 CT1 36 (SUTURE) ×2 IMPLANT
SUT VIC AB 1 CT1 36 (SUTURE) ×2 IMPLANT
SUT VIC AB 2-0 CT1 TAPERPNT 27 (SUTURE) ×1 IMPLANT
SUT VICRYL+ 3-0 36IN CT-1 (SUTURE) IMPLANT
SYR CONTROL 10ML LL (SYRINGE) ×2 IMPLANT
TRAY CATH INTERMITTENT SS 16FR (CATHETERS) IMPLANT
TRAY FOLEY SLVR 14FR TEMP STAT (SET/KITS/TRAYS/PACK) IMPLANT
TUBE SUCTION HIGH CAP CLEAR NV (SUCTIONS) ×1 IMPLANT
WATER STERILE IRR 1000ML POUR (IV SOLUTION) ×2 IMPLANT
WRAP KNEE MAXI GEL POST OP (GAUZE/BANDAGES/DRESSINGS) ×1 IMPLANT

## 2024-10-08 NOTE — Progress Notes (Signed)
 Orthopedic Tech Progress Note Patient Details:  Kathleen Miller 1946-11-04 [id number] Applied bone foam per order.  Ortho Devices Type of Ortho Device: Bone foam zero knee Ortho Device/Splint Location: RLE Ortho Device/Splint Interventions: Application, Ordered, Adjustment   Post Interventions Patient Tolerated: Well Instructions Provided: Adjustment of device, Care of device, Poper ambulation with device  Morna Pink 10/08/2024, 2:55 PM

## 2024-10-08 NOTE — Interval H&P Note (Signed)
 History and Physical Interval Note:  10/08/2024 10:56 AM  Kathleen Miller  has presented today for surgery, with the diagnosis of RIGHT KNEE OSTEOARTHRITIS.  The various methods of treatment have been discussed with the patient and family. After consideration of risks, benefits and other options for treatment, the patient has consented to  Procedure(s): ARTHROPLASTY, KNEE, TOTAL (Right) as a surgical intervention.  The patient's history has been reviewed, patient examined, no change in status, stable for surgery.  I have reviewed the patient's chart and labs.  Questions were answered to the patient's satisfaction.     Kathleen Miller

## 2024-10-08 NOTE — Anesthesia Procedure Notes (Signed)
 Anesthesia Regional Block: Adductor canal block   Pre-Anesthetic Checklist: , timeout performed,  Correct Patient, Correct Site, Correct Laterality,  Correct Procedure, Correct Position, site marked,  Risks and benefits discussed,  Surgical consent,  Pre-op evaluation,  At surgeon's request and post-op pain management  Laterality: Lower and Right  Prep: chloraprep       Needles:  Injection technique: Single-shot  Needle Type: Echogenic Stimulator Needle     Needle Length: 10cm  Needle Gauge: 20     Additional Needles:   Procedures:,,,, ultrasound used (permanent image in chart),, #20gu IV placed    Narrative:  Start time: 10/08/2024 11:47 AM End time: 10/08/2024 11:47 AM Injection made incrementally with aspirations every 5 mL.  Performed by: Personally  Anesthesiologist: Waddell Lauraine NOVAK, MD  Additional Notes: Timeout performed with bedside RN - Name, DOB, allergies and laterality confirmed by the patient and RN. Surgical marking performed/confirmed. Anticoagulation status and most recent platelet count reviewed. Patient placed in a frog-leg position and sedation (as documented by RN) given via PIV. Peripheral nerve block performed as documented above. VSS throughout (see Flowchart).   CANDIE Mcbride, MD

## 2024-10-08 NOTE — Anesthesia Procedure Notes (Signed)
 Spinal  Patient location during procedure: OR Start time: 10/08/2024 12:29 PM End time: 10/08/2024 12:29 PM Reason for block: surgical anesthesia Staffing Performed: anesthesiologist  Anesthesiologist: Waddell Lauraine NOVAK, MD Performed by: Waddell Lauraine NOVAK, MD Authorized by: Waddell Lauraine NOVAK, MD   Preanesthetic Checklist Completed: patient identified, IV checked, site marked, risks and benefits discussed, surgical consent, monitors and equipment checked, pre-op evaluation and timeout performed Spinal Block Patient position: sitting Prep: DuraPrep Patient monitoring: heart rate, cardiac monitor, continuous pulse ox and blood pressure Approach: midline Location: L3-4 Injection technique: single-shot Needle Needle type: Pencan  Needle gauge: 24 G Needle length: 9 cm Assessment Sensory level: Pending. Events: CSF return Additional Notes Patient identified. Risks/Benefits/Options discussed with patient including but not limited to bleeding, infection, nerve damage, paralysis, failed block, blood pressure changes. Confirmed with bedside nurse the patient's most recent platelet count. Confirmed with patient that they are not currently taking any anticoagulation, have any bleeding history or any family history of bleeding disorders. Sterile technique was used throughout the entire procedure as documented above. See intraoperative record for vital signs throughout.   CSF aspiration before, not after injection  S. Colhoun, MD

## 2024-10-08 NOTE — Anesthesia Postprocedure Evaluation (Signed)
 Anesthesia Post Note  Patient: Kathleen Miller  Procedure(s) Performed: ARTHROPLASTY, KNEE, TOTAL (Right: Knee)     Patient location during evaluation: PACU Anesthesia Type: MAC and Spinal Level of consciousness: awake Vital Signs Assessment: post-procedure vital signs reviewed and stable Respiratory status: spontaneous breathing and nonlabored ventilation Cardiovascular status: blood pressure returned to baseline Postop Assessment: no apparent nausea or vomiting Anesthetic complications: no   No notable events documented.  Last Vitals:  Vitals:   10/08/24 1433 10/08/24 1445  BP: 125/65 (!) 145/70  Pulse: 60 62  Resp: 13 16  Temp: (!) 36.3 C   SpO2: 97% 99%    Last Pain:  Vitals:   10/08/24 1445  TempSrc:   PainSc: 0-No pain                 [name]

## 2024-10-08 NOTE — Transfer of Care (Signed)
 Immediate Anesthesia Transfer of Care Note  Patient: Kathleen Miller  Procedure(s) Performed: ARTHROPLASTY, KNEE, TOTAL (Right: Knee)  Patient Location: PACU  Anesthesia Type:Spinal  Level of Consciousness: awake, alert , and oriented  Airway & Oxygen  Therapy: Patient Spontanous Breathing  Post-op Assessment: Report given to RN and Post -op Vital signs reviewed and stable  Post vital signs: Reviewed and stable  Last Vitals:  Vitals Value Taken Time  BP 125/65 10/08/24 14:33  Temp    Pulse 63 10/08/24 14:35  Resp 17 10/08/24 14:35  SpO2 97 % 10/08/24 14:35  Vitals shown include unfiled device data.  Last Pain:  Vitals:   10/08/24 1011  TempSrc:   PainSc: 4          Complications: No notable events documented.

## 2024-10-08 NOTE — Evaluation (Signed)
 Physical Therapy Evaluation Patient Details Name: Kathleen Miller MRN: [id number] DOB: 06-14-1946 Today's Date: 10/08/2024  History of Present Illness  78 yo female presents to therapy s/p R TKA on 10/08/2024 due to failure of conservative measures. Pt PMH includes but is not limited to: hypothyroidism, gastric bypass, anemia, PE, ARF, angina, hernia, chronic pain, GERD, HLD, s and dCHF, spinal stenosis, HTN, DM II, falls (fall 1.5 yrs ago resulted in current injury to R knee) and arthritis.  Clinical Impression      ARLINE KETTER is a 78 y.o. female POD 0 s/p R TKA. Patient reports mod I with mobility at baseline. Patient is now limited by functional impairments (see PT problem list below) and requires CGA and cues for transfers and gait with RW. Patient was able to ambulate 45 and 50  feet with RW and CGA and cues for safe walker management. Patient educated on safe sequencing for stair mobility with B handrails and verbally for use of RW, fall risk prevention, slowly increasing activity level, pain management and goals, use of CP/ice as well as importance of use of RW vs rollator or B SPC, and car transfers pt verbalized understanding.  Patient instructed in exercises to facilitate ROM and circulation reviewed and HO provided. Patient will benefit from continued skilled PT interventions to address impairments and progress towards PLOF. Patient has met mobility goals at adequate level for discharge home with family support and OPPT vs HH services; will continue to follow if pt continues acute stay to progress towards Mod I goals.     If plan is discharge home, recommend the following: A little help with walking and/or transfers;A little help with bathing/dressing/bathroom;Assistance with cooking/housework;Assist for transportation;Help with stairs or ramp for entrance   Can travel by private vehicle        Equipment Recommendations None recommended by PT  Recommendations for Other  Services       Functional Status Assessment Patient has had a recent decline in their functional status and demonstrates the ability to make significant improvements in function in a reasonable and predictable amount of time.     Precautions / Restrictions Precautions Precautions: Knee;Fall Restrictions Weight Bearing Restrictions Per Provider Order: No      Mobility  Bed Mobility Overal bed mobility: Needs Assistance Bed Mobility: Supine to Sit     Supine to sit: Supervision, HOB elevated, Used rails     General bed mobility comments: min cues    Transfers Overall transfer level: Needs assistance Equipment used: Rolling walker (2 wheels) Transfers: Sit to/from Stand Sit to Stand: Contact guard assist           General transfer comment: min cues for safety, UE and AD placement for bed, recliner and commode transfers    Ambulation/Gait Ambulation/Gait assistance: Contact guard assist Gait Distance (Feet): 50 Feet Assistive device: Rolling walker (2 wheels) Gait Pattern/deviations: Step-to pattern, Decreased stance time - right, Antalgic, Trunk flexed Gait velocity: decreased     General Gait Details: trunk flexion compounded by kyphotic posture with B UE support at to offload R LE in stance phase , min cues for safety, RW management  Stairs Stairs: Yes Stairs assistance: Contact guard assist Stair Management: Two rails Number of Stairs: 3 General stair comments: cues for safety, technique and sequencing for step navigation and pt use of RW at baseline for 3 non consective steps to navigate to town home  Wheelchair Mobility     Tilt Bed    Modified Rankin (  Stroke Patients Only)       Balance Overall balance assessment: History of Falls, Needs assistance (pt reports > 10 falls in the past 6 months on am of surgery) Sitting-balance support: Feet supported Sitting balance-Leahy Scale: Good     Standing balance support: Bilateral upper extremity  supported, During functional activity, Reliant on assistive device for balance Standing balance-Leahy Scale: Fair Standing balance comment: static standing no UE support, no overt LOB                             Pertinent Vitals/Pain Pain Assessment Pain Assessment: 0-10 Pain Score: 6  Pain Location: R knee and LE Pain Descriptors / Indicators: Aching, Constant, Grimacing, Operative site guarding Pain Intervention(s): Limited activity within patient's tolerance, Monitored during session, Premedicated before session, Repositioned, Ice applied    Home Living Family/patient expects to be discharged to:: Private residence Living Arrangements: Alone Available Help at Discharge: Family Type of Home: House (town home) Home Access: Stairs to enter Entrance Stairs-Rails: None Secretary/administrator of Steps: 3 single steps from parking area to town home   Home Layout: One level Home Equipment: Agricultural Consultant (2 wheels);Cane - single point;Rollator (4 wheels) Additional Comments: ice man machine    Prior Function Prior Level of Function : Independent/Modified Independent;Driving;Working/employed             Mobility Comments: pt amb with B canes or RW due to knee instabiltiy, mod I for all ADLs, self care tasks and IADLs       Extremity/Trunk Assessment        Lower Extremity Assessment Lower Extremity Assessment: RLE deficits/detail RLE Deficits / Details: ankle DF/PF 5/5; SLR < 10 degree lag RLE Sensation: WNL (mild abn sensation once in standing)    Cervical / Trunk Assessment Cervical / Trunk Assessment: Kyphotic  Communication   Communication Communication: No apparent difficulties    Cognition Arousal: Alert Behavior During Therapy: WFL for tasks assessed/performed   PT - Cognitive impairments: No apparent impairments                         Following commands: Intact       Cueing       General Comments      Exercises Total  Joint Exercises Ankle Circles/Pumps: AROM, Both, 10 reps Quad Sets: AROM, Right, 5 reps Short Arc Quad: AROM, Right, 5 reps Heel Slides: AROM, Right, 5 reps Hip ABduction/ADduction: AROM, Right, 5 reps Straight Leg Raises: AROM, Right, 5 reps Knee Flexion: AROM, Right, 5 reps, Seated   Assessment/Plan    PT Assessment Patient needs continued PT services  PT Problem List Decreased range of motion;Decreased strength;Decreased activity tolerance;Decreased balance;Decreased mobility;Decreased coordination;Pain       PT Treatment Interventions DME instruction;Gait training;Stair training;Functional mobility training;Therapeutic activities;Therapeutic exercise;Balance training;Neuromuscular re-education;Patient/family education;Modalities    PT Goals (Current goals can be found in the Care Plan section)  Acute Rehab PT Goals Patient Stated Goal: to be able to get back to work PT Goal Formulation: With patient Time For Goal Achievement: 10/22/24 Potential to Achieve Goals: Good    Frequency 7X/week     Co-evaluation               AM-PAC PT 6 Clicks Mobility  Outcome Measure Help needed turning from your back to your side while in a flat bed without using bedrails?: None Help needed moving from lying on your back to  sitting on the side of a flat bed without using bedrails?: A Little Help needed moving to and from a bed to a chair (including a wheelchair)?: A Little Help needed standing up from a chair using your arms (e.g., wheelchair or bedside chair)?: A Little Help needed to walk in hospital room?: A Little Help needed climbing 3-5 steps with a railing? : A Little 6 Click Score: 19    End of Session Equipment Utilized During Treatment: Gait belt Activity Tolerance: Patient tolerated treatment well Patient left: in chair;with call bell/phone within reach;with nursing/sitter in room Nurse Communication: Mobility status;Other (comment) (pt readiness for same day d/c from  PT standpoint) PT Visit Diagnosis: Unsteadiness on feet (R26.81);Other abnormalities of gait and mobility (R26.89);Muscle weakness (generalized) (M62.81);Difficulty in walking, not elsewhere classified (R26.2);Pain Pain - Right/Left: Right Pain - part of body: Leg;Knee    Time: 1733-1820 PT Time Calculation (min) (ACUTE ONLY): 47 min   Charges:   PT Evaluation $PT Eval Low Complexity: 1 Low PT Treatments $Gait Training: 8-22 mins $Therapeutic Exercise: 8-22 mins PT General Charges $$ ACUTE PT VISIT: 1 Visit         [place], PT Acute Rehab   [place] VEAR Drone 10/08/2024, 6:47 PM

## 2024-10-08 NOTE — Discharge Instructions (Addendum)

## 2024-10-08 NOTE — Op Note (Signed)
 PATIENT ID:      Kathleen Miller  MRN:     [id number] DOB/AGE:    [birth year] / 78 y.o.       OPERATIVE REPORT   DATE OF PROCEDURE:  10/08/2024      PREOPERATIVE DIAGNOSIS:   RIGHT KNEE OSTEOARTHRITIS      Estimated body mass index is 29.23 kg/m as calculated from the following:   Height as of 10/04/24: 5' 3 (1.6 m).   Weight as of this encounter: 74.8 kg.                                                       POSTOPERATIVE DIAGNOSIS:   Same                                                                  PROCEDURE:  Procedure(s): ARTHROPLASTY, KNEE, TOTAL Using DepuyAttune RP implants #5 Femur, #6Tibia, 5mm Attune RP bearing, 38 Patella    SURGEON: Norleen LITTIE Gavel  ASSISTANT:   Camellia POUR. [employer]   (Present and scrubbed throughout the case, critical for assistance with exposure, retraction, instrumentation, and closure.)        ANESTHESIA: spinal, 20cc Exparel , 50cc 0.25% Marcaine  EBL: min cc FLUID REPLACEMENT: unk cc crystaloid TOURNIQUET: none DRAINS: None TRANEXAMIC ACID: 1gm IV, 2gm topical COMPLICATIONS:  None         INDICATIONS FOR PROCEDURE: The patient has  RIGHT KNEE OSTEOARTHRITIS, varus deformities, XR shows bone on bone arthritis, lateral subluxation of tibia. Patient has failed all conservative measures including anti-inflammatory medicines, narcotics, attempts at exercise and weight loss, cortisone injections and viscosupplementation.  Risks and benefits of surgery have been discussed, questions answered.   DESCRIPTION OF PROCEDURE: The patient identified by armband, received  IV antibiotics, in the holding area at [facility]. Patient taken to the operating room, appropriate anesthetic monitors were attached, and spinal anesthesia was  induced. IV Tranexamic acid was given. Lateral post and 2 surefoot positioners applied to the table, the lower extremity was then prepped and draped in usual sterile fashion from the toes to the high thigh. Time-out  procedure was performed. Camellia POUR. [facility] PAC, was present and scrubbed throughout the case, critical for assistance with, positioning, exposure, retraction, instrumentation, and closure.The skin and subcutaneous tissue along the incision was injected with 20 cc of a mixture of 20cc Exparel  and 30cc Marcaine  50cc saline solution, using a 21-gauge by 1-1/2 inch needle. We began the operation, with the knee flexed 130 degrees, by making the anterior midline incision starting at handbreadth above the patella going over the patella 1 cm medial to and 4 cm distal to the tibial tubercle. Small bleeders in the skin and the subcutaneous tissue identified and cauterized. Transverse retinaculum was incised and reflected medially and a medial parapatellar arthrotomy was accomplished. the patella was everted and theprepatellar fat pad resected. The superficial medial collateral ligament was then elevated from anterior to posterior along the proximal flare of the tibia and anterior half of the menisci resected. The knee was hyperflexed exposing bone on bone arthritis. Peripheral and  notch osteophytes as well as the cruciate ligaments were then resected. We continued to work our way around posteriorly along the proximal tibia, and externally rotated the tibia subluxing it out from underneath the femur. A McHale PCL retractor was placed through the notch, a lateral Hohmann retractor, and anterolateral small homan retractor placed. We then entered the proximal tibia with the Depuy starter drill in line with the axis of the tibia followed by an intramedullary guide rod and 3 degree posterior slope cutting guide. The tibial cutting guide, was pinned into place allowing resection of 2 mm of bone medially and 8 mm of bone laterally. Satisfied with the tibial resection, we then entered the distal femur 2 mm anterior to the PCL origin with the starter drill, followed by the intramedullary guide rod and applied the distal femoral cutting  guide set at 9 mm, with 5 degrees of valgus. This was pinned along the epicondylar axis. At this point, the distal femoral cut was accomplished without difficulty. We then sized for a # 5 femoral component and pinned the chamfer guide in 3 degrees of external rotation. The anterior, posterior, and chamfer cuts were accomplished without difficulty followed by the Attune RP box cutting guide and the box cut. We also removed posterior osteophytes from the posterior femoral condyles. The posterior capsule was injected with Exparel  solution. The knee was brought into full extension. We checked our extension gap and fit a 5 mm trial lollipop. Distracting in extension with a lamina spreader,  bleeders in the posterior capsule, Posterior medial and posterior lateral gutter were cauterized.  The transexamic acid-soaked sponge was then placed in the gap of the knee in extension. The knee was flexed 30. The posterior patella cut was accomplished with the 9.5 mm Attune cutting guide, sized for a 38mm dome, and the fixation pegs drilled.The knee was then once again hyperflexed exposing the proximal tibia. We sized for a # 6tibial base plate, applied the smokestack and the conical reamer followed by the the Delta fin keel punch. We then hammered into place the Attune RP trial femoral component, drilled the lugs, inserted a  5 mm trial bearing, trial patellar button, and took the knee through range of motion from 0-130 degrees. Medial and lateral ligamentous stability was checked. No thumb pressure was required for patellar Tracking.  All trial components were removed, mating surfaces irrigated with pulse lavage, and dried with suction and sponges. Exparel  solution was applied to the cancellus bone of the patella distal femur and proximal tibia.  After waiting 30 seconds, the bony surfaces were again, dried with sponges. A double batch of DePuy HV cement was mixed and applied to all bony metallic mating surfaces except for the  posterior condyles of the femur itself. In order, we hammered into place the tibial tray and removed excess cement, the femoral component and removed excess cement. The final Attune RP bearing was inserted, and the knee brought to full extension with compression. The patellar button was clamped into place, and excess cement removed. The knee was held at 30 flexion with compression using the second surefoot, while the cement cured. The wound was irrigated out with normal saline solution pulse lavage. The rest of the Exparel  was injected into the parapatellar arthrotomy, subcutaneous tissues, and periosteal tissues. The parapatellar arthrotomy was closed with running #1 Vicryl suture. The subcutaneous tissue with 3-0 undyed Vicryl suture, and the skin with running 3-0 SQ vicryl. An Aquacil dressing and Ace wrap were applied. The patient was  taken to recovery room without difficulty.   Norleen LITTIE Gavel 10/08/2024, 4:36 PM

## 2024-10-10 ENCOUNTER — Encounter (HOSPITAL_COMMUNITY): Payer: Self-pay | Admitting: Orthopedic Surgery

## 2024-11-14 ENCOUNTER — Encounter: Payer: Self-pay | Admitting: Neurology

## 2024-11-14 ENCOUNTER — Ambulatory Visit: Admitting: Neurology

## 2024-11-14 VITALS — BP 106/71 | HR 63 | Ht 61.0 in | Wt 174.5 lb

## 2024-11-14 DIAGNOSIS — M1710 Unilateral primary osteoarthritis, unspecified knee: Secondary | ICD-10-CM

## 2024-11-14 DIAGNOSIS — R519 Headache, unspecified: Secondary | ICD-10-CM | POA: Diagnosis not present

## 2024-11-14 DIAGNOSIS — R32 Unspecified urinary incontinence: Secondary | ICD-10-CM

## 2024-11-14 DIAGNOSIS — R296 Repeated falls: Secondary | ICD-10-CM

## 2024-11-14 DIAGNOSIS — R269 Unspecified abnormalities of gait and mobility: Secondary | ICD-10-CM

## 2024-11-14 DIAGNOSIS — R413 Other amnesia: Secondary | ICD-10-CM

## 2024-11-14 MED ORDER — IMIPRAMINE HCL 10 MG PO TABS: 10.0000 mg | ORAL_TABLET | Freq: Every day | ORAL | 11 refills | Status: AC

## 2024-11-14 NOTE — Progress Notes (Signed)
 [facility]  PATIENT: Kathleen Miller DOB: [birth year]  REFERRING DOCTOR OR PCP:  Dr. Pura SOURCE: patint, notes from PCP, lab results.     _________________________________   HISTORICAL  CHIEF COMPLAINT:  Chief Complaint  Patient presents with   RM11/GAIT DISTURBANCE    Pt is here Alone. Pt states that she had knee surgery 10/08/24     HISTORY OF PRESENT ILLNESS:  Kathleen Miller is a 78 y.o. woman with memory and gait difficulties since May 2024  UPDATE 12/172025 She had right knee surgery 11/02/2024.  She is still experiencing pain.  She has left knee pain and weakness still and is wearing a brace.    She is doing PT 5 days a week.     Also since that time, she has noted memory difficult.  She notes more difficulty with her cognition than other people do.   She does not note any other cognitive issue besides memory..   She also notes issues with reduced balance/equilibrium and poor sleep since then.  Also since the incident, she has a daily headache in he occiput and the forehead .  Tylenol  ES 1-2 times a day helps a little bit   She finds it harder to focus hen he HA is more severe.   She rarely takes flexeril  10 mg or tramadol  but just a couple/month  Additionally, she reports urinary urgency and incontinence since the fall May 2024  She sleeps poorly since the accident due to the headache and other symptoms.   Insomnia is both sleep onset and maintenance.   Sleep is worse since her surgery 2 weeks ago due to pain but taking pain med's is associated with increased chance of incontinence.    H/o reduced gait:   She fell Apr 06, 2023.   she was making a deposit at the bank and she reports the parking lot had a lot of cracks.  Her foot got caught and she fell, hitting the back of her head.  No LOC though she had headache and felt stunned.   She had difficulty standing up.    She has had additional falls since then.  She has also had knee pain since the fall  on the right.      Her legs were giving out.     She has T2IDDM and hypothroidism       03/29/2024    2:32 PM  Montreal Cognitive Assessment   Visuospatial/ Executive (0/5) 4  Naming (0/3) 2  Attention: Read list of digits (0/2) 2  Attention: Read list of letters (0/1) 1  Attention: Serial 7 subtraction starting at 100 (0/3) 3  Language: Repeat phrase (0/2) 1  Language : Fluency (0/1) 0  Abstraction (0/2) 2  Delayed Recall (0/5) 4  Orientation (0/6) 5  Total 24   Labs:   TSH was nl.   HgbA1c was 5.7  in 2025  IMAGING: MRI cervical spine 04/19/2024 showed The spinal cord appears normal.  There is a T2 hyperintense focus within the left cerebellar hemisphere consistent with remote ischemic change.    Multilevel degenerative changes as detailed above.  The most significant findings are at C5-C6 where there is mild spinal stenosis and moderately severe right foraminal narrowing that could affect the right C6 nerve root.  MRI brain 04/19/2024 showed T2 hyperintense foci in the cerebral hemispheres consistent with mild chronic vascular ischemic change, typical for age.   There is also a small chronic ischemic focus in the left  cerebellar hemisphere. There were no acute findings Fairly mild generalized cortical atrophy that is normal for age.  REVIEW OF SYSTEMS: Constitutional: No fevers, chills, sweats, or change in appetite Eyes: No visual changes, double vision, eye pain Ear, nose and throat: No hearing loss, ear pain, nasal congestion, sore throat Cardiovascular: No chest pain, palpitations Respiratory:  No shortness of breath at rest or with exertion.   No wheezes GastrointestinaI: No nausea, vomiting, diarrhea, abdominal pain, fecal incontinence Genitourinary: She has urinary urgency and incontinence. Musculoskeletal: Knee pain Integumentary: No rash, pruritus, skin lesions Neurological: as above Psychiatric: No depression at this time.  No anxiety Endocrine: No palpitations,  diaphoresis, change in appetite, change in weigh or increased thirst Hematologic/Lymphatic:  No anemia, purpura, petechiae. Allergic/Immunologic: No itchy/runny eyes, nasal congestion, recent allergic reactions, rashes  ALLERGIES: Allergies  Allergen Reactions   Nsaids Swelling and Other (See Comments)    Severe swelling--water  retention (including water  on brain)   Tolmetin Swelling and Other (See Comments)    Severe swelling-water  retention (including water  on brain)     HOME MEDICATIONS:  Current Outpatient Medications:    apixaban  (ELIQUIS ) 5 MG TABS tablet, Take 5 mg by mouth 2 (two) times daily., Disp: , Rfl:    furosemide  (LASIX ) 20 MG tablet, Take 2 tablets (40 mg total) by mouth daily. (Patient taking differently: Take 40 mg by mouth daily as needed for fluid.), Disp: 180 tablet, Rfl: 3   imipramine  (TOFRANIL ) 10 MG tablet, Take 1 tablet (10 mg total) by mouth at bedtime., Disp: 30 tablet, Rfl: 11   lovastatin  (MEVACOR ) 20 MG tablet, TAKE 1 TABLET BY MOUTH AT BEDTIME, Disp: 90 tablet, Rfl: 0   potassium chloride  SA (KLOR-CON  M) 20 MEQ tablet, Take by mouth., Disp: , Rfl:    thyroid  (NP THYROID ) 60 MG tablet, Take 1 tablet (60 mg total) by mouth daily before breakfast., Disp: 30 tablet, Rfl: 5   traMADol  (ULTRAM ) 50 MG tablet, TAKE 1 TABLET BY MOUTH EVERY 6 HOURS AS NEEDED FOR MODERATE PAIN, Disp: 30 tablet, Rfl: 1   acetaZOLAMIDE (DIAMOX) 250 MG tablet, Take 250 mg by mouth daily., Disp: , Rfl:    albuterol  (PROAIR  HFA) 108 (90 Base) MCG/ACT inhaler, Inhale 2 puffs into the lungs every 6 (six) hours as needed for wheezing or shortness of breath., Disp: 18 g, Rfl: 5   apixaban  (ELIQUIS ) 2.5 MG TABS tablet, Take 1 tablet (2.5 mg total) by mouth 2 (two) times daily. (Patient not taking: Reported on 11/14/2024), Disp: 30 tablet, Rfl: 0   docusate sodium  (COLACE) 100 MG capsule, Take 1 capsule (100 mg total) by mouth 2 (two) times daily., Disp: 60 capsule, Rfl: 2   ferrous sulfate 325  (65 FE) MG EC tablet, Take 325 mg by mouth daily with breakfast., Disp: , Rfl:    minoxidil (LONITEN) 2.5 MG tablet, Take 2.5 mg by mouth daily., Disp: , Rfl:    ondansetron  (ZOFRAN ) 4 MG tablet, 4 mg., Disp: , Rfl:    oxyCODONE -acetaminophen  (PERCOCET/ROXICET) 5-325 MG tablet, Take 1 tablet by mouth every 4 (four) hours as needed for severe pain (pain score 7-10). (Patient not taking: Reported on 11/14/2024), Disp: 30 tablet, Rfl: 0   Potassium Chloride  ER 20 MEQ TBCR, Take 40 mEq by mouth in the morning and at bedtime. (Patient not taking: Reported on 11/14/2024), Disp: , Rfl:    RESTASIS MULTIDOSE 0.05 % ophthalmic emulsion, Place 1 drop into both eyes 2 (two) times daily as needed (dry eyes)., Disp: ,  Rfl:    semaglutide-weight management (WEGOVY) 0.25 MG/0.5ML SOAJ SQ injection, Inject 0.25 mg into the skin., Disp: , Rfl:    tiZANidine  (ZANAFLEX ) 2 MG tablet, Take 1 tablet (2 mg total) by mouth every 6 (six) hours as needed., Disp: 60 tablet, Rfl: 0   Vitamin D, Ergocalciferol, (DRISDOL) 1.25 MG (50000 UNIT) CAPS capsule, Take 50,000 Units by mouth every Friday., Disp: , Rfl:   PAST MEDICAL HISTORY: Past Medical History:  Diagnosis Date   Anemia    Arthritis    Chronic back pain    Dyspnea    Hypertension    Hypothyroidism    Pre-diabetes    takes metformin  preventatively   Pulmonary embolism (HCC)    Ventral hernia     PAST SURGICAL HISTORY: Past Surgical History:  Procedure Laterality Date   ABDOMINAL HYSTERECTOMY  11/2011   CHOLECYSTECTOMY  1988   COLONOSCOPY WITH PROPOFOL  N/A 05/27/2016   Procedure: COLONOSCOPY WITH PROPOFOL ;  Surgeon: Jerrell Sol, MD;  Location: [facility];  Service: Endoscopy;  Laterality: N/A;   DILATION AND CURETTAGE OF UTERUS     x2   ESOPHAGOGASTRODUODENOSCOPY (EGD) WITH PROPOFOL  N/A 05/27/2016   Procedure: ESOPHAGOGASTRODUODENOSCOPY (EGD) WITH PROPOFOL ;  Surgeon: Jerrell Sol, MD;  Location: [facility];  Service: Endoscopy;  Laterality:  N/A;   GASTRIC BYPASS     GASTRIC ROUX-EN-Y N/A 12/13/2017   Procedure: LAPAROSCOPIC ASSTED VENTRAL HERNIA REPAIR, Upper Endo;  Surgeon: Mikell Katz, MD;  Location: [facility];  Service: General;  Laterality: N/A;  With MESH   LEFT HEART CATH AND CORONARY ANGIOGRAPHY N/A 08/05/2017   Procedure: LEFT HEART CATH AND CORONARY ANGIOGRAPHY;  Surgeon: Jordan, Peter M, MD;  Location: [facility] INVASIVE CV LAB;  Service: Cardiovascular;  Laterality: N/A;   revision gastric bypass     and ventrel hernia repair  Dr. Mikell 12-13-17   SPLENECTOMY, TOTAL  1988   TOTAL KNEE ARTHROPLASTY Right 10/08/2024   Procedure: ARTHROPLASTY, KNEE, TOTAL;  Surgeon: Yvone Rush, MD;  Location: [facility];  Service: Orthopedics;  Laterality: Right;    FAMILY HISTORY: Family History  Problem Relation Age of Onset   Alzheimer's disease Neg Hx    Dementia Neg Hx    Migraines Neg Hx     SOCIAL HISTORY: Social History   Socioeconomic History   Marital status: Divorced    Spouse name: Not on file   Number of children: Not on file   Years of education: Not on file   Highest education level: Not on file  Occupational History   Not on file  Tobacco Use   Smoking status: Never   Smokeless tobacco: Never  Vaping Use   Vaping status: Never Used  Substance and Sexual Activity   Alcohol  use: No   Drug use: No   Sexual activity: Never  Other Topics Concern   Not on file  Social History Narrative   Pt lives alone    Pt works    Social Drivers of Health   Tobacco Use: Low Risk (11/14/2024)   Patient History    Smoking Tobacco Use: Never    Smokeless Tobacco Use: Never    Passive Exposure: Not on file  Financial Resource Strain: Low Risk (07/25/2024)   Received from [facility]   Overall Financial Resource Strain (CARDIA)    How hard is it for you to pay for the very basics like food, housing, medical care, and heating?: Not hard at all  Food Insecurity: No Food Insecurity (07/25/2024)   Received from  [facility]   Epic    Within the past 12 months, you worried that your food would run out before you got the money to buy more.: Never true    Within the past 12 months, the food you bought just didn't last and you didn't have money to get more.: Never true  Transportation Needs: No Transportation Needs (07/25/2024)   Received from [facility]    In the past 12 months, has lack of transportation kept you from medical appointments or from getting medications?: No    In the past 12 months, has lack of transportation kept you from meetings, work, or from getting things needed for daily living?: No  Physical Activity: Inactive (07/25/2024)   Received from [facility]   Exercise Vital Sign    On average, how many days per week do you engage in moderate to strenuous exercise (like a brisk walk)?: 0 days    Minutes of Exercise per Session: Not on file  Stress: Stress Concern Present (07/25/2024)   Received from [facility] - Occupational Stress Questionnaire    Do you feel stress - tense, restless, nervous, or anxious, or unable to sleep at night because your mind is troubled all the time - these days?: Rather much  Social Connections: Socially Isolated (07/25/2024)   Received from [facility]   Social Network    How would you rate your social network (family, work, friends)?: Little participation, lonely and socially isolated  Intimate Partner Violence: Not At Risk (07/25/2024)   Received from [facility]   HITS    Over the last 12 months how often did your partner physically hurt you?: Never    Over the last 12 months how often did your partner insult you or talk down to you?: Never    Over the last 12 months how often did your partner threaten you with physical harm?: Never    Over the last 12 months how often did your partner scream or curse at you?: Never  Depression (PHQ2-9): Not on file  Alcohol  Screen: Not on file  Housing: Low Risk  (07/25/2024)   Received from [facility]    In the last 12 months, was there a time when you were not able to pay the mortgage or rent on time?: No    In the past 12 months, how many times have you moved where you were living?: 0    At any time in the past 12 months, were you homeless or living in a shelter (including now)?: No  Utilities: Not At Risk (07/25/2024)   Received from [facility]    In the past 12 months has the electric, gas, oil, or water  company threatened to shut off services in your home?: No  Health Literacy: Not on file       PHYSICAL EXAM  Vitals:   11/14/24 1443  BP: 106/71  Pulse: 63  Weight: 174 lb 8 oz (79.2 kg)  Height: 5' 1 (1.549 m)    Body mass index is 32.97 kg/m.   General: The patient is well-developed and well-nourished and in no acute distress  HEENT:  Head is [place]/AT.  Sclera are anicteric.   Neck: No carotid bruits are noted.  The neck is nontender.  Cardiovascular: The heart has a regular rate and rhythm with a normal S1 and S2. There were no murmurs, gallops or rubs.  Skin: Extremities are without rash or  edema.  Musculoskeletal: Right knee bandaged  Neurologic Exam  Mental status: The patient is alert and oriented x 2 1/2 at the time of the examination.  She has reduced focus and short-term memory.Kathleen Miller   Speech is normal.  Cranial nerves: Extraocular movements are full.  Facial symmetry is present. There is good facial sensation to soft touch bilaterally.Facial strength is normal.  Trapezius and sternocleidomastoid strength is normal. No dysarthria is noted.  The tongue is midline, and the patient has symmetric elevation of the soft palate. No obvious hearing deficits are noted.  Motor:  Muscle bulk is normal.   Tone is normal. Strength is  5 / 5 in all 4 extremities.   Sensory: Sensory testing is intact in arms, reduced right leg vibration relative to left and reduced left leg touch/temp compared to  left.  Coordination: Cerebellar testing reveals good finger-nose-finger and heel-to-shin bilaterally.  Gait and station: Requires support to rise from chair.   Station is normal.   Is using a walker and stride is near normal. Due to surgery, not tested without walker  Romberg is negative.   Reflexes: Deep tendon reflexes are symmetric and normal in arms and 1 at knees and ankles.       DIAGNOSTIC DATA (LABS, IMAGING, TESTING) - I reviewed patient records, labs, notes, testing and imaging myself where available.  Lab Results  Component Value Date   WBC 9.1 10/04/2024   HGB 11.3 (L) 10/04/2024   HCT 35.9 (L) 10/04/2024   MCV 86.3 10/04/2024   PLT 446 (H) 10/04/2024      Component Value Date/Time   NA 138 10/04/2024 1123   NA 139 07/29/2017 0823   K 4.0 10/04/2024 1123   CL 110 10/04/2024 1123   CO2 17 (L) 10/04/2024 1123   GLUCOSE 88 10/04/2024 1123   BUN 14 10/04/2024 1123   BUN 10 07/29/2017 0823   CREATININE 0.95 10/04/2024 1123   CREATININE 0.86 10/11/2019 0819   CREATININE 0.63 03/19/2015 1254   CALCIUM 9.0 10/04/2024 1123   PROT 4.2 (L) 05/04/2024 1825   ALBUMIN 2.1 (L) 05/04/2024 1825   AST 15 05/04/2024 1825   AST 14 (L) 10/11/2019 0819   ALT 18 05/04/2024 1825   ALT 12 10/11/2019 0819   ALKPHOS 43 05/04/2024 1825   BILITOT 1.1 05/04/2024 1825   BILITOT 0.3 10/11/2019 0819   GFRNONAA >60 10/04/2024 1123   GFRNONAA >60 10/11/2019 0819   GFRAA >60 10/11/2019 0819   Lab Results  Component Value Date   CHOL 133 05/21/2020   HDL 32.40 (L) 05/21/2020   LDLCALC 68 05/21/2020   LDLDIRECT 105.0 01/31/2019   TRIG 162.0 (H) 05/21/2020   CHOLHDL 4 05/21/2020   Lab Results  Component Value Date   HGBA1C 6.0 05/21/2020   Lab Results  Component Value Date   VITAMINB12 214 (L) 03/29/2024   Lab Results  Component Value Date   TSH 2.21 05/21/2020       ASSESSMENT AND PLAN  Gait disturbance  Falls  Chronic daily headache  Urinary incontinence,  unspecified type  Memory loss  Knee arthropathy   Change nortriptyline  to imipramine  as may help insomnia better.     May also help her nocturia. Stay active, use walker.  No etiology of falls determined.    Rtc prn new or worsening symptoms   This visit is part of a comprehensive longitudinal care medical relationship regarding the patients primary diagnosis of gait disturbance  and related concerns.   Rashay Barnette A. Vear, MD, [facility] 11/14/2024, 3:06 PM Certified in Neurology, Clinical Neurophysiology, Sleep Medicine and Neuroimaging  [facility] [address], [address] [place], [place] [address] [phone]
